# Patient Record
Sex: Female | Born: 1960 | Race: White | Hispanic: No | Marital: Single | Smoking: Never smoker
Health system: Southern US, Community
[De-identification: ages and names within clinical notes are randomized; demographics above are authoritative.]

## PROBLEM LIST (undated history)

## (undated) DIAGNOSIS — Z803 Family history of malignant neoplasm of breast: Secondary | ICD-10-CM

## (undated) DIAGNOSIS — M1990 Unspecified osteoarthritis, unspecified site: Secondary | ICD-10-CM

## (undated) DIAGNOSIS — Z860100 Personal history of colon polyps, unspecified: Secondary | ICD-10-CM

## (undated) DIAGNOSIS — G4730 Sleep apnea, unspecified: Secondary | ICD-10-CM

## (undated) DIAGNOSIS — E119 Type 2 diabetes mellitus without complications: Secondary | ICD-10-CM

## (undated) DIAGNOSIS — K219 Gastro-esophageal reflux disease without esophagitis: Secondary | ICD-10-CM

## (undated) DIAGNOSIS — Z87442 Personal history of urinary calculi: Secondary | ICD-10-CM

## (undated) DIAGNOSIS — T7840XA Allergy, unspecified, initial encounter: Secondary | ICD-10-CM

## (undated) DIAGNOSIS — Z8719 Personal history of other diseases of the digestive system: Secondary | ICD-10-CM

## (undated) HISTORY — DX: Personal history of colon polyps: Z86.010

## (undated) HISTORY — DX: Allergy, unspecified, initial encounter: T78.40XA

## (undated) HISTORY — PX: BREAST SURGERY: SHX581

## (undated) HISTORY — PX: CHOLECYSTECTOMY: SHX55

## (undated) HISTORY — DX: Personal history of urinary calculi: Z87.442

## (undated) HISTORY — DX: Family history of malignant neoplasm of breast: Z80.3

## (undated) HISTORY — DX: Personal history of colon polyps, unspecified: Z86.0100

## (undated) HISTORY — DX: Gastro-esophageal reflux disease without esophagitis: K21.9

## (undated) HISTORY — DX: Unspecified osteoarthritis, unspecified site: M19.90

## (undated) HISTORY — PX: BREAST EXCISIONAL BIOPSY: SUR124

## (undated) HISTORY — PX: BILATERAL CARPAL TUNNEL RELEASE: SHX6508

## (undated) HISTORY — PX: SPINE SURGERY: SHX786

## (undated) HISTORY — DX: Personal history of other diseases of the digestive system: Z87.19

## (undated) HISTORY — DX: Type 2 diabetes mellitus without complications: E11.9

## (undated) HISTORY — PX: OTHER SURGICAL HISTORY: SHX169

---

## 2006-03-26 ENCOUNTER — Ambulatory Visit: Payer: Self-pay | Admitting: Surgery

## 2006-10-12 ENCOUNTER — Ambulatory Visit: Payer: Self-pay | Admitting: Obstetrics and Gynecology

## 2007-04-16 ENCOUNTER — Ambulatory Visit: Payer: Self-pay | Admitting: Obstetrics and Gynecology

## 2007-10-23 ENCOUNTER — Ambulatory Visit: Payer: Self-pay | Admitting: Obstetrics and Gynecology

## 2007-10-30 ENCOUNTER — Emergency Department: Payer: Self-pay | Admitting: Emergency Medicine

## 2008-11-18 ENCOUNTER — Ambulatory Visit: Payer: Self-pay | Admitting: Obstetrics and Gynecology

## 2009-01-28 ENCOUNTER — Encounter: Admission: RE | Admit: 2009-01-28 | Discharge: 2009-01-28 | Payer: Self-pay | Admitting: Sports Medicine

## 2009-02-19 ENCOUNTER — Encounter: Admission: RE | Admit: 2009-02-19 | Discharge: 2009-02-19 | Payer: Self-pay | Admitting: Sports Medicine

## 2009-04-20 ENCOUNTER — Encounter: Admission: RE | Admit: 2009-04-20 | Discharge: 2009-04-20 | Payer: Self-pay | Admitting: Dermatology

## 2009-06-09 ENCOUNTER — Ambulatory Visit (HOSPITAL_COMMUNITY): Admission: RE | Admit: 2009-06-09 | Discharge: 2009-06-10 | Payer: Self-pay | Admitting: Neurological Surgery

## 2009-07-19 ENCOUNTER — Encounter: Admission: RE | Admit: 2009-07-19 | Discharge: 2009-07-19 | Payer: Self-pay | Admitting: Neurological Surgery

## 2009-09-20 ENCOUNTER — Encounter: Admission: RE | Admit: 2009-09-20 | Discharge: 2009-09-20 | Payer: Self-pay | Admitting: Neurological Surgery

## 2009-11-23 ENCOUNTER — Ambulatory Visit: Payer: Self-pay | Admitting: Obstetrics and Gynecology

## 2009-12-07 ENCOUNTER — Encounter: Admission: RE | Admit: 2009-12-07 | Discharge: 2009-12-07 | Payer: Self-pay | Admitting: Neurological Surgery

## 2011-03-27 LAB — BASIC METABOLIC PANEL
BUN: 11 mg/dL (ref 6–23)
CO2: 31 mEq/L (ref 19–32)
Chloride: 101 mEq/L (ref 96–112)
GFR calc Af Amer: >60 mL/min (ref >60)
GFR calc non Af Amer: >60 mL/min (ref >60)
Potassium: 4 mEq/L (ref 3.5–5.1)

## 2011-03-27 LAB — CBC
HCT: 39.6 % (ref 36.0–46.0)
Hemoglobin: 13.7 g/dL (ref 12.0–15.0)
MCHC: 34.5 g/dL (ref 30.0–36.0)
MCV: 91.3 fL (ref 78.0–100.0)
Platelets: 231 10*3/uL (ref 150–400)
RBC: 4.34 MIL/uL (ref 3.87–5.11)
WBC: 8.3 10*3/uL (ref 4.0–10.5)

## 2011-03-27 LAB — DIFFERENTIAL
Eosinophils Relative: 2 % (ref 0–5)
Monocytes Relative: 8 % (ref 3–12)

## 2011-03-27 LAB — PROTIME-INR: INR: 1 (ref 0.00–1.49)

## 2011-05-02 NOTE — Op Note (Signed)
NAMEMELLINA, Brittany Santos NO.:  [id number]   MEDICAL RECORD NO.:  [id number]          PATIENT TYPE:  INP   LOCATION:  3599                         FACILITY:  [facility]   PHYSICIAN:  Tia Alert, MD     DATE OF BIRTH:  Nov 11, 1961   DATE OF PROCEDURE:  06/09/2009  DATE OF DISCHARGE:                               OPERATIVE REPORT   PREOPERATIVE DIAGNOSIS:  Cervical spondylosis of C5-6, C6-7 with neck  and pain.   POSTOPERATIVE DIAGNOSIS:  Cervical spondylosis of C5-6, C6-7 with neck  and pain.   PROCEDURE:  1. Decompressive anterior cervical diskectomy C5-6, C6-7.  2. Anterior cervical arthrodesis C5-6, C6-7 utilizing 6-mm PEEK      interbody cages and packed with local autograft and Actifuse putty.  3. Anterior cervical plating C5 to C7 utilizing a 40-mm Atlantis      Venture plate.   SURGEON:  Tia Alert, MD   ASSISTANT:  Donalee Citrin, MD   ANESTHESIA:  General endotracheal.   COMPLICATIONS:  None apparent.   INDICATION FOR THE PROCEDURE:  Ms. Schmuhl is a 50 year old female who  presented with severe neck pain with bilateral arm.  She had an MRI and  a CT scan, which showed cervical spondylosis at C5-6 and C6-7.  She has  tried medical management for quite some time without significant  relief.  I recommended a two-level anterior cervical diskectomy and  fusion with plating at C5-6 and C6-7.  She understood the risks,  benefits, and expected outcome, and wished to proceed.   DESCRIPTION OF THE PROCEDURE:  The patient was taken to the operating  room.  After induction of adequate general endotracheal anesthesia, she  was placed in supine position on the operating room table.  Her right  anterior cervical region was prepped with DuraPrep and then draped in  usual sterile fashion.  A 5 mL of local anesthesia was injected and a  transverse incision was made to the right of midline and carried down to  the platysma, which was elevated, opened, and then  undermined with  Metzenbaum scissors.  I then dissected a plane medial to the  sternocleidomastoid muscle and internal carotid artery and lateral to  the trachea and esophagus to expose C5-6 and C6-7.  Intraoperative  fluoroscopy confirmed the levels, and I took down the longus colli  muscles and placed the Shadow-Line retractors under these to expose C5-  C6 and C6-C7.  The annulus was incised and the initial diskectomy was  done with pituitary rongeurs and curved curettes.  We then used a high-  speed drill to drill the endplates to prepare for later arthrodesis.  The drill shavings were saved in a mucous trap for later arthrodesis.  I  drilled down to the level of the posterior longitudinal ligament and  posterior osteophytes.  We then brought in the operating microscope.  We  opened the posterior longitudinal ligament with a nerve hook.  Each  level was decompressed in the exact same method.  I opened the posterior  longitudinal ligament with a nerve hook and undercut the upper and lower  vertebral bodies by angling the microscope up and down and angling  Kerrison to right up under this as best I could to circumferentially  decompress the central canal, bilateral foraminotomies were performed,  marching along the superior endplate of each level and identifying the  pedicles and then marching along the nerve roots, and then palpated  circumferentially with a nerve hook to assure adequate decompression.  The dura was full and capacious all the way across.  I then irrigated  with saline solution, dried the surgical bed with Surgifoam, and then  measured the interspace.  I used 6-mm PEEK interbody cages packed with  local autograft, Actifuse  putty and tapped these into position.  I then  used a 40-mm Atlantis Venture plate and placed two 13-mm variable angle  screws in the bodies of C5, C6, and C7, and these locked into the plate  by the locking mechanism within the plate.  I then  irrigated with saline  solution containing bacitracin, dried all bleeding points, with bipolar  cautery.  Once meticulous hemostasis was achieved, I closed the platysma  with 3-0 Vicryl, closed the subcuticular tissue with 3-0 Vicryl, and  closed the skin with Benzoin and Steri-Strips.  The drapes were removed.  A sterile dressing was applied.  The patient was awakened from general  anesthesia and transported to the recovery room in stable condition.  At  the end of procedure, all sponge, needle, and instrument counts were  correct.      Tia Alert, MD  Electronically Signed     DSJ/MEDQ  D:  06/09/2009  T:  06/10/2009  Job:  [id number]

## 2011-08-04 ENCOUNTER — Ambulatory Visit: Payer: Self-pay | Admitting: Gastroenterology

## 2011-08-17 ENCOUNTER — Encounter: Payer: Self-pay | Admitting: Sports Medicine

## 2011-08-19 ENCOUNTER — Encounter: Payer: Self-pay | Admitting: Sports Medicine

## 2011-09-18 ENCOUNTER — Encounter: Payer: Self-pay | Admitting: Sports Medicine

## 2011-10-19 ENCOUNTER — Encounter: Payer: Self-pay | Admitting: Sports Medicine

## 2012-06-26 ENCOUNTER — Ambulatory Visit: Payer: Self-pay | Admitting: Obstetrics and Gynecology

## 2013-11-24 ENCOUNTER — Other Ambulatory Visit: Payer: Self-pay | Admitting: Physical Medicine and Rehabilitation

## 2013-11-24 DIAGNOSIS — M719 Bursopathy, unspecified: Secondary | ICD-10-CM

## 2013-11-26 ENCOUNTER — Ambulatory Visit
Admission: RE | Admit: 2013-11-26 | Discharge: 2013-11-26 | Disposition: A | Discharge status: Home / Self Care | Payer: BC Managed Care – PPO | Source: Ambulatory Visit | Attending: Physical Medicine and Rehabilitation | Admitting: Physical Medicine and Rehabilitation

## 2013-11-26 DIAGNOSIS — M719 Bursopathy, unspecified: Secondary | ICD-10-CM

## 2014-07-23 ENCOUNTER — Ambulatory Visit: Payer: Self-pay | Admitting: Obstetrics and Gynecology

## 2014-07-29 ENCOUNTER — Ambulatory Visit: Payer: Self-pay | Admitting: Obstetrics and Gynecology

## 2014-09-20 LAB — HM PAP SMEAR: HM PAP: NORMAL

## 2014-10-09 LAB — BASIC METABOLIC PANEL
BUN: 11 mg/dL (ref 4–21)
CREATININE: 0.8 mg/dL (ref 0.5–1.1)
POTASSIUM: 4.3 mmol/L (ref 3.4–5.3)
Sodium: 142 mmol/L (ref 137–147)

## 2014-10-09 LAB — CBC AND DIFFERENTIAL
HCT: 41 % (ref 36–46)
Hemoglobin: 13.9 g/dL (ref 12.0–16.0)
NEUTROS ABS: 5 /uL
PLATELETS: 224 10*3/uL (ref 150–399)
WBC: 73 10^3/mL

## 2014-10-09 LAB — HEPATIC FUNCTION PANEL
ALT: 16 U/L (ref 7–35)
AST: 17 U/L (ref 13–35)
Alkaline Phosphatase: 84 U/L (ref 25–125)
BILIRUBIN, TOTAL: 0.6 mg/dL

## 2014-10-09 LAB — TSH: TSH: 1.89 u[IU]/mL (ref 0.41–5.90)

## 2014-10-09 LAB — LIPID PANEL
Cholesterol: 141 mg/dL (ref 0–200)
HDL: 67 mg/dL (ref 35–70)
LDL CALC: 60 mg/dL
Triglycerides: 71 mg/dL (ref 40–160)

## 2014-10-16 LAB — HM MAMMOGRAPHY: HM MAMMO: NORMAL

## 2014-10-23 LAB — BASIC METABOLIC PANEL: Glucose: 162 mg/dL

## 2014-10-23 LAB — HEMOGLOBIN A1C: HEMOGLOBIN A1C: 6.5 % — AB (ref 4.0–6.0)

## 2014-12-21 LAB — HM COLONOSCOPY

## 2015-01-15 ENCOUNTER — Ambulatory Visit: Payer: Self-pay | Admitting: Gastroenterology

## 2015-01-20 ENCOUNTER — Ambulatory Visit (INDEPENDENT_AMBULATORY_CARE_PROVIDER_SITE_OTHER): Payer: BLUE CROSS/BLUE SHIELD | Admitting: Endocrinology

## 2015-01-20 ENCOUNTER — Encounter: Payer: Self-pay | Admitting: Endocrinology

## 2015-01-20 ENCOUNTER — Encounter (INDEPENDENT_AMBULATORY_CARE_PROVIDER_SITE_OTHER): Payer: Self-pay

## 2015-01-20 VITALS — BP 128/88 | HR 81 | Resp 14 | Ht 64.0 in | Wt 242.5 lb

## 2015-01-20 DIAGNOSIS — E119 Type 2 diabetes mellitus without complications: Secondary | ICD-10-CM | POA: Insufficient documentation

## 2015-01-20 DIAGNOSIS — E118 Type 2 diabetes mellitus with unspecified complications: Secondary | ICD-10-CM | POA: Insufficient documentation

## 2015-01-20 LAB — HM DIABETES FOOT EXAM: HM Diabetic Foot Exam: NORMAL

## 2015-01-20 MED ORDER — GLUCOSE BLOOD VI STRP: ORAL_STRIP | Status: DC

## 2015-01-20 NOTE — Patient Instructions (Signed)
Refer to DM education.   Check sugars at various times of the day about 3 times weekly.   Continue current metformin together with diet and weight loss efforts.   Set up with new PCP here Lorane Gell)  Please come back for a follow-up appointment in 3 months

## 2015-01-20 NOTE — Progress Notes (Signed)
Reason for visit-  Brittany Santos is a 54 y.o.-year-old female, referred by her PCP,  Elgie Collard, MD for management of Type 2 diabetes, uncontrolled, without complications- new diagnosis   HPI- Patient has been diagnosed with diabetes in Nov 2015 following screening A1c. She has a prior history of prediabetes. Started on metformin since then.    she has not been on insulin before.  *Gets ESI q3 months , last one Nov 2015  Pt is currently on a regimen of: - Metformin 500 mg po bid>> taking 1000 mg qhs (tolerating well, has on and off diarrhea even prior to start of metformin)    Last hemoglobin A1c was: Lab Results  Component Value Date   HGBA1C 6.5* 10/23/2014     Pt checks her sugars 3 times weekly in the morning . Uses [employer] brand glucometer. By recall/meter download/meter review they are:  PREMEAL Breakfast Lunch Dinner Bedtime Overall  Glucose range: 101-165, mostly around 130      Mean/median:        POST-MEAL PC Breakfast PC Lunch PC Dinner  Glucose range:     Mean/median:       Hypoglycemia-  No lows. Lowest sugar was n/a;   Dietary habits- eats three times daily. Tries to limit carbs, sweetened beverages, sodas, desserts. Eats whole wheat bread, cut out sodas, drinks water 90% time, staying away from desserts Exercise- has exercise equipment at home- uses every other day Weight -  Wt Readings from Last 3 Encounters:  01/20/15 242 lb 8 oz (109.997 kg)    Diabetes Complications-  Nephropathy- No  CKD, last BUN/creatinine- GFR 83 Lab Results  Component Value Date   BUN 11 10/09/2014   CREATININE 0.8 10/09/2014    Retinopathy- No, Last DEE was in n/a Neuropathy- no numbness and tingling in her feet. No known neuropathy.  Associated history - No CAD . No prior stroke. No hypothyroidism. her last TSH was  Lab Results  Component Value Date   TSH 1.89 10/09/2014    Lipids-  her last set of lipids were- Currently on dietary therapy.  Tolerating well.   Lab Results  Component Value Date   CHOL 141 10/09/2014   HDL 67 10/09/2014   LDLCALC 60 10/09/2014   TRIG 71 10/09/2014   Lab Results  Component Value Date   AST 17 10/09/2014   ALT 16 10/09/2014    Blood Pressure/HTN- Patient's blood pressure is well controlled today.  Pt has FH of DM in  none.  I have reviewed the patient's past medical history, family and social history, surgical history, medications and allergies.  Past Medical History  Diagnosis Date  . Diabetes mellitus without complication    Past Surgical History  Procedure Laterality Date  . Breast surgery    . Bilateral carpal tunnel release    . Cholecystectomy    . Cervical neck fusion     Family History  Problem Relation Age of Onset  . Hypertension Brother   . Diabetes Neg Hx    History   Social History  . Marital Status: Single    Spouse Name: N/A    Number of Children: N/A  . Years of Education: N/A   Occupational History  . Not on file.   Social History Main Topics  . Smoking status: Never Smoker   . Smokeless tobacco: Never Used  . Alcohol Use: No  . Drug Use: No  . Sexual Activity: Not on file   Other Topics Concern  .  Not on file   Social History Narrative   No current outpatient prescriptions on file prior to visit.   No current facility-administered medications on file prior to visit.   Allergies  Allergen Reactions  . Vicodin [Hydrocodone-Acetaminophen] Itching and Rash     Review of Systems: [x]  complains of  [  ] denies General:   [  ] Recent weight change [  ] Fatigue  [  ] Loss of appetite Eyes: [  ]  Vision Difficulty [  ]  Eye pain ENT: [  ]  Hearing difficulty [  ]  Difficulty Swallowing CVS: [  ] Chest pain [  ]  Palpitations/Irregular Heart beat [  ]  Shortness of breath lying flat [  ] Swelling of legs Resp: [  ] Frequent Cough [  ] Shortness of Breath  [  ]  Wheezing GI: [ x ] Heartburn  [  ] Nausea or Vomiting  [ x ] Diarrhea [  ]  Constipation  [  ] Abdominal Pain GU: [  ]  Polyuria  [ x ]  nocturia Bones/joints:  [  ]  Muscle aches  [ x ] Joint Pain  [  ] Bone pain Skin/Hair/Nails: [  ]  Rash  [  ] New stretch marks [  ]  Itching [ x ] Hair loss [  ]  Excessive hair growth Reproduction: [  ] Low sexual desire , [  ]  Women: Menstrual cycle problems [  ]  Women: Breast Discharge [  ] Men: Difficulty with erections [  ]  Men: Enlarged Breasts CNS: [  ] Frequent Headaches [  ] Blurry vision [  ] Tremors [  ] Seizures [  ] Loss of consciousness [  ] Localized weakness Endocrine: [  ]  Excess thirst [  ]  Feeling excessively hot [  ]  Feeling excessively cold Heme: [  ]  Easy bruising [  ]  Enlarged glands or lumps in neck Allergy: [  ]  Food allergies [ x ] Environmental allergies  PE: BP 128/88 mmHg  Pulse 81  Resp 14  Ht 5\' 4"  (1.626 m)  Wt 242 lb 8 oz (109.997 kg)  BMI 41.60 kg/m2  SpO2 98% Wt Readings from Last 3 Encounters:  01/20/15 242 lb 8 oz (109.997 kg)   GENERAL: No acute distress, well developed HEENT:  Eye exam shows normal external appearance. Oral exam shows normal mucosa .  NECK:   Neck exam shows no lymphadenopathy. No Carotids bruits. Thyroid is not enlarged and no nodules felt.  no acanthosis nigricans LUNGS:         Chest is symmetrical. Lungs are clear to auscultation.Marland Kitchen   HEART:         Heart sounds:  S1 and S2 are normal. No murmurs or clicks heard. ABDOMEN:  No Distention present. Liver and spleen are not palpable. No other mass or tenderness present.  EXTREMITIES:     There is no edema. 2+ DP pulses  NEUROLOGICAL:     Grossly intact.            Diabetic foot exam done with shoes and socks removed: Normal Monofilament testing bilaterally. No deformities of toes.  Nails  Not dystrophic. Skin normal color. No open wounds. Dry skin.  MUSCULOSKELETAL:       There is no enlargement or gross deformity of the joints.  SKIN:       No rash  ASSESSMENT AND  PLAN: Problem List Items Addressed This  Visit      Endocrine   Type 2 diabetes mellitus - Primary    Discussed pathophysiology of insulin resistance and DM, goal sugars and A1c reviewed. Reviewed long term complications, dietary modifications and exercise, weight loss.   Switch to brand meter and she will let me know if Freestyle is not covered( then will send supplies for Verio One Touch) Check 3 times weekly at various times of the day.  Refer to DM education at [facility].  Discussed options for treatment could include intensive lifestyle modifications with or without metformin.  She wants to continue current metformin as well. Reviewed common side effects and risks.   Foot care, eye exams, hypoglycemia reviewed.  Recent lipids well controlled.  BP at target.   RTC 3 months. Follow up labs then including urine MA.       Relevant Medications   metFORMIN (GLUCOPHAGE) 500 MG tablet   glucose blood (FREESTYLE LITE) test strip   Other Relevant Orders   Amb Referral to Nutrition and Diabetic E         - Return to clinic in 3 mo with sugar log/meter. 45 minutes spent with the patient, >50% time spent on discussion of topics mentioned above.  Set up with Lorane Gell for new PCP Currently, she goes to her GYN for annual exams.    Taylynn Easton [facility] 01/21/2015 9:09 AM

## 2015-01-20 NOTE — Progress Notes (Signed)
Pre visit review using our clinic review tool, if applicable. No additional management support is needed unless otherwise documented below in the visit note. 

## 2015-01-21 ENCOUNTER — Encounter: Payer: Self-pay | Admitting: Endocrinology

## 2015-01-21 NOTE — Assessment & Plan Note (Addendum)
Discussed pathophysiology of insulin resistance and DM, goal sugars and A1c reviewed. Reviewed long term complications, dietary modifications and exercise, weight loss.   Switch to brand meter and she will let me know if Freestyle is not covered( then will send supplies for Verio One Touch) Check 3 times weekly at various times of the day.  Refer to DM education at [facility].  Discussed options for treatment could include intensive lifestyle modifications with or without metformin.  She wants to continue current metformin as well. Reviewed common side effects and risks.   Foot care, eye exams, hypoglycemia reviewed.  Recent lipids well controlled.  BP at target.   RTC 3 months. Follow up labs then including urine MA.

## 2015-03-03 ENCOUNTER — Other Ambulatory Visit: Payer: Self-pay | Admitting: Endocrinology

## 2015-03-03 ENCOUNTER — Other Ambulatory Visit: Payer: Self-pay | Admitting: *Deleted

## 2015-03-03 MED ORDER — METFORMIN HCL 500 MG PO TABS: 1000.0000 mg | ORAL_TABLET | Freq: Every day | ORAL | Status: DC

## 2015-03-18 ENCOUNTER — Encounter: Payer: Self-pay | Admitting: Podiatry

## 2015-03-18 ENCOUNTER — Ambulatory Visit (INDEPENDENT_AMBULATORY_CARE_PROVIDER_SITE_OTHER): Payer: BLUE CROSS/BLUE SHIELD | Admitting: Podiatry

## 2015-03-18 ENCOUNTER — Ambulatory Visit (INDEPENDENT_AMBULATORY_CARE_PROVIDER_SITE_OTHER): Payer: BLUE CROSS/BLUE SHIELD

## 2015-03-18 VITALS — BP 110/69 | HR 81 | Resp 16 | Ht 65.0 in | Wt 235.0 lb

## 2015-03-18 DIAGNOSIS — D239 Other benign neoplasm of skin, unspecified: Secondary | ICD-10-CM

## 2015-03-18 DIAGNOSIS — Q828 Other specified congenital malformations of skin: Secondary | ICD-10-CM

## 2015-03-18 DIAGNOSIS — L600 Ingrowing nail: Secondary | ICD-10-CM

## 2015-03-18 DIAGNOSIS — M79672 Pain in left foot: Secondary | ICD-10-CM

## 2015-03-18 NOTE — Progress Notes (Signed)
   Subjective:    Patient ID: Brittany Santos [id number], female    DOB: Nov 02, 1961, 54 y.o.   MRN:  HPI 54 year old female presents the office today with complaints of a painful callus on the left foot under the little toe. She previously states that she had mild right side in the same area which was trimmed and she did not have any problems afterwards. She denies any redness or drainage on the site. She also states that she has some discomfort along the inside border of the left big toenail the end of the nail. She is concerned and started become ingrown and is mildly painful. Denies any redness or drainage on the nail site. No other complaints at this time. She states that she was recently diagnosed with diabetes. She states that her blood sugars have been  "normal" and her HbA1c was just slightly above normal. Denies any history of injury to the lower extremities.    Review of Systems  HENT: Positive for sinus pressure and sneezing.   Eyes: Positive for redness and itching.  Allergic/Immunologic: Positive for environmental allergies.  All other systems reviewed and are negative.      Objective:   Physical Exam AAO 3, NAD DP/PT pulses palpable, CRT less than 3 seconds Protective sensation intact with Simms Weinstein monofilament, vibratory sensation intact, Achilles tendon reflex intact. Tailors bunion deformities present bilaterally. There is a hyperkeratotic lesion left foot submetatarsal 5 with a small punctate annular lesion centrally. There is tenderness palpation directly overlying this lesion. Upon debridement there is no underlying ulceration, drainage, pinpoint bleeding.  There is mild tenderness on the distal medial aspect left hallux toenail with slight incurvation. There is no tenderness on the proximal medial nail border. There is no other tenderness to other nails. There is no swelling erythema or drainage on the nail sites. There is no areas of pinpoint bony tenderness or pain with  vibratory sensation of bilateral lower extremities. No overlying edema, erythema, increase in warmth bilaterally. No open lesions or other pre-ulcerative lesions identified bilaterally There is slight white discoloration to the toenails. Patient states this area is new and she believes it may be from the nail polish/remover. No pain with calf compression, swelling, warmth, erythema.     Assessment & Plan:  54 year old female with left submetatarsal 5 porokeratosis, tailors bunion, ingrown toenail left hallux -X-rays were obtained and reviewed with the patient -Treatment options were discussed including alternatives, risks, complications. -Left foot hyperkeratotic lesion sharply debrided without complication/bleeding. A pad was applied around the area followed by salinocaine and a bandage. Post procedure care was discussed with the patient. Monitoring signs or symptoms of infection and directed to call the office immediately should any occur or go to the ER. -Left hallux nail sharply debrided without complications/bleeding to patient comfort -Discussed the patient a white spots on the nails could be a result of onychomycosis. Will observe. If there is no improvement will call and start possible treatment.  -Follow-up as needed. In the meantime encouraged to call the office with any questions, concerns, change in symptoms.

## 2015-04-08 ENCOUNTER — Ambulatory Visit
Admit: 2015-04-08 | Disposition: A | Discharge status: Home / Self Care | Payer: Self-pay | Attending: Unknown Physician Specialty | Admitting: Unknown Physician Specialty

## 2015-04-12 LAB — SURGICAL PATHOLOGY

## 2015-04-15 DIAGNOSIS — Z87442 Personal history of urinary calculi: Secondary | ICD-10-CM

## 2015-04-15 HISTORY — DX: Personal history of urinary calculi: Z87.442

## 2015-04-21 ENCOUNTER — Ambulatory Visit (INDEPENDENT_AMBULATORY_CARE_PROVIDER_SITE_OTHER): Payer: BLUE CROSS/BLUE SHIELD | Admitting: Endocrinology

## 2015-04-21 ENCOUNTER — Ambulatory Visit
Admission: RE | Admit: 2015-04-21 | Discharge: 2015-04-21 | Disposition: A | Discharge status: Home / Self Care | Payer: BLUE CROSS/BLUE SHIELD | Source: Ambulatory Visit | Attending: Nurse Practitioner | Admitting: Nurse Practitioner

## 2015-04-21 ENCOUNTER — Ambulatory Visit (INDEPENDENT_AMBULATORY_CARE_PROVIDER_SITE_OTHER): Payer: BLUE CROSS/BLUE SHIELD | Admitting: Nurse Practitioner

## 2015-04-21 ENCOUNTER — Encounter: Payer: Self-pay | Admitting: Endocrinology

## 2015-04-21 ENCOUNTER — Encounter: Payer: Self-pay | Admitting: Nurse Practitioner

## 2015-04-21 VITALS — BP 126/82 | HR 83 | Resp 14 | Ht 64.0 in | Wt 232.8 lb

## 2015-04-21 VITALS — BP 110/72 | HR 81 | Temp 97.6°F | Ht 64.0 in | Wt 232.0 lb

## 2015-04-21 DIAGNOSIS — Z7189 Other specified counseling: Secondary | ICD-10-CM

## 2015-04-21 DIAGNOSIS — Z9109 Other allergy status, other than to drugs and biological substances: Secondary | ICD-10-CM | POA: Insufficient documentation

## 2015-04-21 DIAGNOSIS — Z7689 Persons encountering health services in other specified circumstances: Secondary | ICD-10-CM | POA: Insufficient documentation

## 2015-04-21 DIAGNOSIS — M25511 Pain in right shoulder: Secondary | ICD-10-CM | POA: Diagnosis not present

## 2015-04-21 DIAGNOSIS — E119 Type 2 diabetes mellitus without complications: Secondary | ICD-10-CM | POA: Diagnosis not present

## 2015-04-21 DIAGNOSIS — Z91048 Other nonmedicinal substance allergy status: Secondary | ICD-10-CM

## 2015-04-21 DIAGNOSIS — K219 Gastro-esophageal reflux disease without esophagitis: Secondary | ICD-10-CM | POA: Insufficient documentation

## 2015-04-21 DIAGNOSIS — M1990 Unspecified osteoarthritis, unspecified site: Secondary | ICD-10-CM | POA: Insufficient documentation

## 2015-04-21 LAB — COMPREHENSIVE METABOLIC PANEL
ALK PHOS: 62 U/L (ref 39–117)
ALT: 14 U/L (ref 0–35)
AST: 20 U/L (ref 0–37)
Albumin: 4.1 g/dL (ref 3.5–5.2)
BILIRUBIN TOTAL: 0.6 mg/dL (ref 0.2–1.2)
BUN: 14 mg/dL (ref 6–23)
CO2: 29 meq/L (ref 19–32)
CREATININE: 0.73 mg/dL (ref 0.40–1.20)
Calcium: 9.4 mg/dL (ref 8.4–10.5)
Chloride: 102 mEq/L (ref 96–112)
GFR: 87.92 mL/min (ref >60.00)
Glucose, Bld: 134 mg/dL — ABNORMAL HIGH (ref 70–99)
Potassium: 3.9 mEq/L (ref 3.5–5.1)
SODIUM: 138 meq/L (ref 135–145)
Total Protein: 7.4 g/dL (ref 6.0–8.3)

## 2015-04-21 LAB — LIPID PANEL
CHOLESTEROL: 128 mg/dL (ref 0–200)
HDL: 53 mg/dL (ref >39.00)
LDL Cholesterol: 60 mg/dL (ref 0–99)
NONHDL: 75
TRIGLYCERIDES: 74 mg/dL (ref 0.0–149.0)
Total CHOL/HDL Ratio: 2
VLDL: 14.8 mg/dL (ref 0.0–40.0)

## 2015-04-21 LAB — HEMOGLOBIN A1C: Hgb A1c MFr Bld: 6 % (ref 4.6–6.5)

## 2015-04-21 LAB — MICROALBUMIN / CREATININE URINE RATIO
Creatinine,U: 148 mg/dL
MICROALB/CREAT RATIO: 0.9 mg/g (ref 0.0–30.0)
Microalb, Ur: 1.3 mg/dL (ref 0.0–1.9)

## 2015-04-21 LAB — VITAMIN B12: Vitamin B-12: 256 pg/mL (ref 211–911)

## 2015-04-21 MED ORDER — MELOXICAM 15 MG PO TABS: 15.0000 mg | ORAL_TABLET | Freq: Every day | ORAL | Status: DC

## 2015-04-21 NOTE — Assessment & Plan Note (Signed)
X-ray of shoulder, mobic and ultram for pain. Will follow up 4 weeks.

## 2015-04-21 NOTE — Progress Notes (Signed)
Reason for visit-  Brittany Santos is a 54 y.o.-year-old female,  for management of Type 2 diabetes, uncontrolled, without complications- new diagnosis 2015. Last visit 3 months ago.    HPI- Patient has been diagnosed with diabetes in Nov 2015 following screening A1c. She has a prior history of prediabetes. Started on metformin since then.    she has not been on insulin before.  *Gets ESI q3 months , last one  Feb 2016  Pt is currently on a regimen of: - Metformin 500 mg po bid>> taking 1000 mg qhs (tolerating well, has on and off diarrhea even prior to start of metformin)    Last hemoglobin A1c was: Lab Results  Component Value Date   HGBA1C 6.5* 10/23/2014     Pt checks her sugars 1 times daily at varying times , mostly in the morning . Uses [employer] brand glucometer. By recall they are:  PREMEAL Breakfast Lunch Dinner Bedtime Overall  Glucose range: 130s avg    <150s  Mean/median:        POST-MEAL PC Breakfast PC Lunch PC Dinner  Glucose range:     Mean/median:       Hypoglycemia-  No lows. Lowest sugar was n/a;   Dietary habits- eats three times daily. Tries to limit carbs, sweetened beverages, sodas, desserts. Eats whole wheat bread, cut out sodas, drinks water 90% time, staying away from desserts. Went to [place] [facility]- March 2016- found those helpful Exercise- has exercise equipment at home- uses every other day- now a days- lack of time, working extra Weight -  Wt Readings from Last 3 Encounters:  04/21/15 232 lb 12.8 oz (105.597 kg)  03/18/15 235 lb (106.595 kg)  01/20/15 242 lb 8 oz (109.997 kg)    Diabetes Complications-  Nephropathy- No  CKD, last BUN/creatinine- GFR 83 Lab Results  Component Value Date   BUN 11 10/09/2014   CREATININE 0.8 10/09/2014   No results found for: GFR, MICRALBCREAT   Retinopathy- No, Last DEE was in summer 2015, due for repeat Neuropathy- no numbness and tingling in her feet. No known neuropathy.  Associated  history - No CAD . No prior stroke. No hypothyroidism. her last TSH was  Lab Results  Component Value Date   TSH 1.89 10/09/2014    Lipids-  her last set of lipids were- Currently on dietary therapy. Tolerating well.   Lab Results  Component Value Date   CHOL 141 10/09/2014   HDL 67 10/09/2014   LDLCALC 60 10/09/2014   TRIG 71 10/09/2014   Lab Results  Component Value Date   AST 17 10/09/2014   ALT 16 10/09/2014    Blood Pressure/HTN- Patient's blood pressure is well controlled today.  Pt has FH of DM in  none.  I have reviewed the patient's past medical history, medications and allergies.   Current Outpatient Prescriptions on File Prior to Visit  Medication Sig Dispense Refill  . cetirizine (ZYRTEC) 10 MG tablet Take 10 mg by mouth daily.    Marland Kitchen glucose blood (FREESTYLE LITE) test strip Use as instructed daily 100 each 12  . metFORMIN (GLUCOPHAGE) 500 MG tablet Take 2 tablets (1,000 mg total) by mouth at bedtime. 90 tablet 2  . pantoprazole (PROTONIX) 40 MG tablet Take 40 mg by mouth daily.    . traMADol (ULTRAM) 50 MG tablet Take 1 tablet by mouth as needed.     No current facility-administered medications on file prior to visit.   Allergies  Allergen Reactions  .  Vicodin [Hydrocodone-Acetaminophen] Itching and Rash     Review of Systems- [ x ]  Complains of    [  ]  denies [  ] Recent weight change [  ]  Fatigue [  ] polydipsia [  ] polyuria [  ]  nocturia [  ]  vision difficulty [  ] chest pain [  ] shortness of breath [  ] leg swelling [  ] cough [  ] nausea/vomiting [  ] diarrhea [  ] constipation [  ] abdominal pain [ x ]  tingling/numbness in extremities [ x ]  concern with feet ( wounds/sores)>>calluses on feet- goes to podiatry Having some new onset right shoulder pain, going to address this with [facility].  PE: BP 126/82 mmHg  Pulse 83  Resp 14  Ht 5\' 4"  (1.626 m)  Wt 232 lb 12.8 oz (105.597 kg)  BMI 39.94 kg/m2  SpO2 95% Wt Readings from Last 3  Encounters:  04/21/15 232 lb 12.8 oz (105.597 kg)  03/18/15 235 lb (106.595 kg)  01/20/15 242 lb 8 oz (109.997 kg)   Exam: deferred  ASSESSMENT AND PLAN: Problem List Items Addressed This Visit      Endocrine   Type 2 diabetes mellitus - Primary    Continue current dietary efforts. Try to increase your exercise component.  Continue checking your sugars once daily- every other day at varying times of the day and bring meter to next appointment.  Labs today.  Update A1c and remainder labs.  If A1c has risen up, then will increase metformin.  Asked her to discuss with her PCP about alternative to freq ESI and new onset right shoulder pain Update B12 levels due to symptoms of tingling/Numbness in extremities and because she is on metformin  Recent lipids well controlled.  BP at target.   RTC 4 months.         Relevant Orders   Comprehensive metabolic panel   Hemoglobin A1c   Microalbumin / creatinine urine ratio   Lipid panel   Vitamin B12         - Return to clinic in 4 mo with sugar log/meter.    Brittany Santos [facility] 04/21/2015 8:40 AM

## 2015-04-21 NOTE — Patient Instructions (Signed)
[facility] entrance  Walk in to receptionist and they will tell you where imaging is (open til 7pm?)   Shoulder Pain The shoulder is the joint that connects your arms to your body. The bones that form the shoulder joint include the upper arm bone (humerus), the shoulder blade (scapula), and the collarbone (clavicle). The top of the humerus is shaped like a ball and fits into a rather flat socket on the scapula (glenoid cavity). A combination of muscles and strong, fibrous tissues that connect muscles to bones (tendons) support your shoulder joint and hold the ball in the socket. Small, fluid-filled sacs (bursae) are located in different areas of the joint. They act as cushions between the bones and the overlying soft tissues and help reduce friction between the gliding tendons and the bone as you move your arm. Your shoulder joint allows a wide range of motion in your arm. This range of motion allows you to do things like scratch your back or throw a ball. However, this range of motion also makes your shoulder more prone to pain from overuse and injury. Causes of shoulder pain can originate from both injury and overuse and usually can be grouped in the following four categories:  Redness, swelling, and pain (inflammation) of the tendon (tendinitis) or the bursae (bursitis).  Instability, such as a dislocation of the joint.  Inflammation of the joint (arthritis).  Broken bone (fracture). HOME CARE INSTRUCTIONS   Apply ice to the sore area.  Put ice in a plastic bag.  Place a towel between your skin and the bag.  Leave the ice on for 15-20 minutes, 3-4 times per day for the first 2 days, or as directed by your health care provider.  Stop using cold packs if they do not help with the pain.  If you have a shoulder sling or immobilizer, wear it as long as your caregiver instructs. Only remove it to shower or bathe. Move your arm as little as possible, but keep your hand  moving to prevent swelling.  Squeeze a soft ball or foam pad as much as possible to help prevent swelling.  Only take over-the-counter or prescription medicines for pain, discomfort, or fever as directed by your caregiver. SEEK MEDICAL CARE IF:   Your shoulder pain increases, or new pain develops in your arm, hand, or fingers.  Your hand or fingers become cold and numb.  Your pain is not relieved with medicines. SEEK IMMEDIATE MEDICAL CARE IF:   Your arm, hand, or fingers are numb or tingling.  Your arm, hand, or fingers are significantly swollen or turn white or blue. MAKE SURE YOU:   Understand these instructions.  Will watch your condition.  Will get help right away if you are not doing well or get worse. Document Released: 09/13/2005 Document Revised: 04/20/2014 Document Reviewed: 11/18/2011 [facility] Patient Information 2015 [place], [place]. This information is not intended to replace advice given to you by your health care provider. Make sure you discuss any questions you have with your health care provider.

## 2015-04-21 NOTE — Assessment & Plan Note (Signed)
Discussed acute and chronic issues. Reviewed health maintenance measures, PFSHx, and immunizations.   

## 2015-04-21 NOTE — Assessment & Plan Note (Signed)
Bilateral knee pain, stable, ultram and mobic.

## 2015-04-21 NOTE — Progress Notes (Signed)
Pre visit review using our clinic review tool, if applicable. No additional management support is needed unless otherwise documented below in the visit note. 

## 2015-04-21 NOTE — Progress Notes (Signed)
Subjective:    Patient ID: Brittany Santos, female    DOB: 1961/11/29, 54 y.o.   MRN: [id number]  HPI  Brittany Santos is a 54 yo female with a CC of establishing care and right shoulder pain x 1 week.   Diet- No sodas, wheat bread, decrease sweets, watching carbs/startches, not eat late  Exercise- Being more active  Colonoscopy- 2016  Allergies- zyrtec daily, sudafed prn Arthritis- both knees, right hip (Dr. Ernestina Patches injections)  DM- Dr. Howell Rucks, going well  GERD- controlled with medications  Neck fusion- 5-6 years ago, Dr. Ronnald Ramp [place]   1) Shoulder pain right-  O- 04/15/15 (carries 50-60 lbs daily) L- front anterior shoulder   D- constant C- dull throb radiating to hand, but not involving fingers A- worse with sleeping on right shoulder and with activity  R- Better with rest and when on chest  S- 9/10 worst, 5/10 normally  Treatment to date-   No difference with tramadol    Heat- helpful somewhat   Review of Systems  Constitutional: Negative for fever, chills, diaphoresis and fatigue.  Respiratory: Negative for chest tightness, shortness of breath and wheezing.   Cardiovascular: Negative for chest pain, palpitations and leg swelling.  Endocrine: Negative for polydipsia and polyphagia.  Musculoskeletal: Positive for arthralgias.       Right shoulder pain  Skin: Negative for rash.  Neurological: Negative for dizziness, weakness, numbness and headaches.  Psychiatric/Behavioral: The patient is not nervous/anxious.    Past Medical History  Diagnosis Date  . Diabetes mellitus without complication   . Arthritis   . History of diverticulitis   . Allergy   . GERD (gastroesophageal reflux disease)   . History of colon polyps   . History of kidney stones 04/15/15    History   Social History  . Marital Status: Single    Spouse Name: N/A  . Number of Children: N/A  . Years of Education: N/A   Occupational History  . Not on file.   Social  History Main Topics  . Smoking status: Never Smoker   . Smokeless tobacco: Never Used  . Alcohol Use: No  . Drug Use: No  . Sexual Activity: No   Other Topics Concern  . Not on file   Social History Narrative   Works- [employer]    Mother lives with pt   No children    1 dog lives inside    Right handed    Caffeine- 1 piece chocolate occasionally    Associate degree   Enjoys- gardening and going to concerts     Past Surgical History  Procedure Laterality Date  . Breast surgery    . Bilateral carpal tunnel release    . Cholecystectomy    . Cervical neck fusion      Family History  Problem Relation Age of Onset  . Hypertension Brother   . Diabetes Neg Hx   . Breast cancer Mother   . Cancer Sister 33    Blood cancer (unknown)  . Cancer Maternal Aunt     Lung  . Cancer Maternal Uncle 30    kidney  . Heart attack Paternal Uncle   . Aneurysm Paternal Grandmother     Allergies  Allergen Reactions  . Vicodin [Hydrocodone-Acetaminophen] Itching and Rash    Current Outpatient Prescriptions on File Prior to Visit  Medication Sig Dispense Refill  . cetirizine (ZYRTEC) 10 MG tablet Take 10 mg by mouth daily.    Marland Kitchen  glucose blood (FREESTYLE LITE) test strip Use as instructed daily 100 each 12  . metFORMIN (GLUCOPHAGE) 500 MG tablet Take 2 tablets (1,000 mg total) by mouth at bedtime. 90 tablet 2  . pantoprazole (PROTONIX) 40 MG tablet Take 40 mg by mouth daily.    . traMADol (ULTRAM) 50 MG tablet Take 1 tablet by mouth as needed.     No current facility-administered medications on file prior to visit.      Objective:   Physical Exam  Constitutional: She is oriented to person, place, and time. She appears well-developed and well-nourished. No distress.  BP 110/72 mmHg  Pulse 81  Temp(Src) 97.6 F (36.4 C) (Oral)  Ht 5\' 4"  (1.626 m)  Wt 232 lb (105.235 kg)  BMI 39.80 kg/m2  SpO2 99%   HENT:  Head: Normocephalic and atraumatic.  Right Ear: External ear  normal.  Left Ear: External ear normal.  Eyes: EOM are normal. Pupils are equal, round, and reactive to light. Right eye exhibits no discharge. Left eye exhibits no discharge. No scleral icterus.  Neck: Normal range of motion. Neck supple. No thyromegaly present.  Cardiovascular: Normal rate, regular rhythm and normal heart sounds.   Pulmonary/Chest: Effort normal and breath sounds normal. No respiratory distress. She has no wheezes. She has no rales. She exhibits no tenderness.  Musculoskeletal: She exhibits tenderness. She exhibits no edema.  Pt has very tender anterior shoulder at bicep tendon attachment point, unable to perform ROM due to pain and unable to keep up against resistance without severe pain.   Lymphadenopathy:    She has no cervical adenopathy.  Neurological: She is alert and oriented to person, place, and time. No cranial nerve deficit. She exhibits normal muscle tone. Coordination normal.  Skin: Skin is warm and dry. No rash noted. She is not diaphoretic.  Psychiatric: She has a normal mood and affect. Her behavior is normal. Judgment and thought content normal.       Assessment & Plan:

## 2015-04-21 NOTE — Assessment & Plan Note (Signed)
Continue current dietary efforts. Try to increase your exercise component.  Continue checking your sugars once daily- every other day at varying times of the day and bring meter to next appointment.  Labs today.  Update A1c and remainder labs.  If A1c has risen up, then will increase metformin.  Asked her to discuss with her PCP about alternative to freq ESI and new onset right shoulder pain Update B12 levels due to symptoms of tingling/Numbness in extremities and because she is on metformin  Recent lipids well controlled.  BP at target.   RTC 4 months.

## 2015-04-21 NOTE — Assessment & Plan Note (Signed)
Stable. Zyrtec daily and sudafed as needed

## 2015-04-21 NOTE — Assessment & Plan Note (Signed)
Sees Dr. Howell Rucks- see note from today's visit. Stable currently

## 2015-04-21 NOTE — Patient Instructions (Addendum)
Continue checking sugars and try to exercise daily. Labs today. Continue current metformin Please come back for a follow-up appointment in 4 months

## 2015-04-21 NOTE — Assessment & Plan Note (Signed)
Stable on Protonix. 

## 2015-04-29 ENCOUNTER — Other Ambulatory Visit: Payer: Self-pay | Admitting: Nurse Practitioner

## 2015-04-29 ENCOUNTER — Telehealth: Payer: Self-pay | Admitting: Nurse Practitioner

## 2015-04-29 DIAGNOSIS — M25511 Pain in right shoulder: Secondary | ICD-10-CM

## 2015-04-29 NOTE — Telephone Encounter (Signed)
Placed order for Right shoulder MRI. Placed for [facility]. Please inform pt we will contact her in the next few weeks to schedule. In the mean time I can schedule a follow up visit with her to explore other pain management options if she would like.

## 2015-04-29 NOTE — Telephone Encounter (Signed)
Please advise 

## 2015-04-29 NOTE — Telephone Encounter (Signed)
The patient called and stated that someone would call her on this pass Monday to follow up on her condition . She informed me that she was given anti inflammatory medication to for her shoulder and it is not working . She also stated that if the medication did not work that she will need to have an MRI done because the xray did not show anything. She would like to got to [facility] on [address] to have the MRI done.

## 2015-04-30 NOTE — Telephone Encounter (Signed)
Spoke with patient and she verbalized understanding.  Pt says she is doing okay on Tramadol and watching the way she moves the arm.  She does not want to schedule a visit to discuss other pain management options. Pt will wait to make an appointment after MRI is resulted.

## 2015-05-09 ENCOUNTER — Ambulatory Visit
Admission: RE | Admit: 2015-05-09 | Discharge: 2015-05-09 | Disposition: A | Discharge status: Home / Self Care | Payer: BLUE CROSS/BLUE SHIELD | Source: Ambulatory Visit | Attending: Nurse Practitioner | Admitting: Nurse Practitioner

## 2015-05-09 DIAGNOSIS — M25511 Pain in right shoulder: Secondary | ICD-10-CM

## 2015-05-10 ENCOUNTER — Other Ambulatory Visit: Payer: Self-pay | Admitting: Nurse Practitioner

## 2015-05-10 DIAGNOSIS — M7581 Other shoulder lesions, right shoulder: Principal | ICD-10-CM

## 2015-05-10 DIAGNOSIS — M778 Other enthesopathies, not elsewhere classified: Secondary | ICD-10-CM

## 2015-05-25 ENCOUNTER — Ambulatory Visit: Payer: BLUE CROSS/BLUE SHIELD | Admitting: Nurse Practitioner

## 2015-07-01 ENCOUNTER — Other Ambulatory Visit: Payer: Self-pay | Admitting: Obstetrics and Gynecology

## 2015-07-01 DIAGNOSIS — Z1231 Encounter for screening mammogram for malignant neoplasm of breast: Secondary | ICD-10-CM

## 2015-07-12 ENCOUNTER — Other Ambulatory Visit: Payer: Self-pay

## 2015-07-13 ENCOUNTER — Other Ambulatory Visit: Payer: Self-pay

## 2015-07-16 ENCOUNTER — Other Ambulatory Visit: Payer: Self-pay | Admitting: *Deleted

## 2015-07-16 MED ORDER — METFORMIN HCL 500 MG PO TABS: 1000.0000 mg | ORAL_TABLET | Freq: Every day | ORAL | Status: DC

## 2015-07-16 MED ORDER — METFORMIN HCL 1000 MG PO TABS: 1000.0000 mg | ORAL_TABLET | Freq: Every day | ORAL | Status: DC

## 2015-07-16 NOTE — Telephone Encounter (Signed)
Rx refill sent to [facility] for Metformin 1,000mg  Q[facility]. Left message on patients voicemail

## 2015-08-12 ENCOUNTER — Ambulatory Visit: Payer: BLUE CROSS/BLUE SHIELD | Admitting: Nurse Practitioner

## 2015-08-12 ENCOUNTER — Ambulatory Visit (INDEPENDENT_AMBULATORY_CARE_PROVIDER_SITE_OTHER): Payer: BLUE CROSS/BLUE SHIELD | Admitting: Nurse Practitioner

## 2015-08-12 ENCOUNTER — Encounter (INDEPENDENT_AMBULATORY_CARE_PROVIDER_SITE_OTHER): Payer: Self-pay

## 2015-08-12 VITALS — BP 132/80 | HR 79 | Resp 16 | Ht 64.0 in | Wt 228.6 lb

## 2015-08-12 DIAGNOSIS — L659 Nonscarring hair loss, unspecified: Secondary | ICD-10-CM | POA: Diagnosis not present

## 2015-08-12 DIAGNOSIS — E119 Type 2 diabetes mellitus without complications: Secondary | ICD-10-CM

## 2015-08-12 DIAGNOSIS — M25511 Pain in right shoulder: Secondary | ICD-10-CM | POA: Diagnosis not present

## 2015-08-12 LAB — BASIC METABOLIC PANEL
BUN: 14 mg/dL (ref 6–23)
CO2: 29 meq/L (ref 19–32)
CREATININE: 0.68 mg/dL (ref 0.40–1.20)
Calcium: 9.4 mg/dL (ref 8.4–10.5)
Chloride: 104 mEq/L (ref 96–112)
GFR: 95.32 mL/min (ref >60.00)
Glucose, Bld: 127 mg/dL — ABNORMAL HIGH (ref 70–99)
Potassium: 4.4 mEq/L (ref 3.5–5.1)
Sodium: 141 mEq/L (ref 135–145)

## 2015-08-12 LAB — HEMOGLOBIN A1C: Hgb A1c MFr Bld: 5.8 % (ref 4.6–6.5)

## 2015-08-12 LAB — T4, FREE: FREE T4: 1.08 ng/dL (ref 0.60–1.60)

## 2015-08-12 LAB — TSH: TSH: 2.05 u[IU]/mL (ref 0.35–4.50)

## 2015-08-12 NOTE — Patient Instructions (Signed)
Please visit the lab before leaving today.   Things to work on: Stress relief Diet and exercise (keep up the good work!)  Follow up in 3 months with me.

## 2015-08-12 NOTE — Progress Notes (Signed)
Patient ID: Brittany Santos [id number], female    DOB: 24-May-1961  Age: 54 y.o. MRN: [id number]  CC: Follow-up   HPI Brittany Santos [id number] presents for follow up of diabetes.   1) Diabetes- Sugars Ranging: 113- 158   Metformin- no changes by Dr. 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 Pt reports no complaints today.   History Brittany Santos has a past medical history of Diabetes mellitus without complication; Arthritis; History of diverticulitis; Allergy; GERD (gastroesophageal reflux disease); History of colon polyps; and History of kidney stones (04/15/15).   She has past surgical history that includes Breast surgery; Bilateral carpal tunnel release; Cholecystectomy; and cervical neck fusion.   Her family history includes Aneurysm in her paternal grandmother; Breast cancer in her mother; Cancer in her maternal aunt; Cancer (age of onset: 89) in her sister; Cancer (age of onset: 10) in her maternal uncle; Heart attack in her paternal uncle; Hypertension in her brother. There is no history of Diabetes.She reports that she has never smoked. She has never used smokeless tobacco. She reports that she does not drink alcohol or use illicit drugs.  Outpatient Prescriptions Prior to Visit  Medication Sig Dispense Refill  . cetirizine (ZYRTEC) 10 MG tablet Take 10 mg by mouth daily.    Marland Kitchen glucose blood (FREESTYLE LITE) test strip Use as instructed daily 100 each 12  . metFORMIN (GLUCOPHAGE) 500 MG tablet Take 2 tablets (1,000 mg total) by mouth at bedtime. 180 tablet 1  . pantoprazole (PROTONIX) 40 MG tablet Take 40 mg by mouth daily.    . traMADol (ULTRAM) 50 MG tablet Take 1 tablet by  mouth as needed.    . meloxicam (MOBIC) 15 MG tablet Take 1 tablet (15 mg total) by mouth daily. (Patient not taking: Reported on 08/12/2015) 30 tablet 0   No facility-administered medications prior to visit.    ROS Review of Systems  Constitutional: Negative for fever, chills, diaphoresis and fatigue.  Eyes: Negative for visual disturbance.  Respiratory: Negative for chest tightness, shortness of breath and wheezing.   Cardiovascular: Negative for chest pain, palpitations and leg swelling.  Gastrointestinal: Negative for nausea, vomiting and diarrhea.  Endocrine: Negative for cold intolerance, heat intolerance, polydipsia, polyphagia and polyuria.       Hair thinning  Musculoskeletal: Negative for arthralgias.  Skin: Negative for rash.  Neurological: Negative for dizziness, numbness and headaches.    Objective:  BP 132/80 mmHg  Pulse 79  Resp 16  Ht 5\' 4"  (1.626 m)  Wt 228 lb 9.6 oz (103.692 kg)  BMI 39.22 kg/m2  SpO2 96%  Physical Exam  Constitutional: She is oriented to person, place, and time. She appears well-developed and well-nourished. No distress.  HENT:  Head: Normocephalic and atraumatic.  Right Ear: External ear normal.  Left Ear: External ear normal.  Cardiovascular: Normal rate, regular rhythm and normal heart sounds.  Exam reveals no gallop and no friction rub.   No murmur heard. Pulmonary/Chest: Effort normal and breath sounds normal. No respiratory distress. She has no wheezes. She has no rales. She exhibits no tenderness.  Neurological: She is alert and oriented to  person, place, and time. No cranial nerve deficit. She exhibits normal muscle tone. Coordination normal.  Skin: Skin is warm and dry. No rash noted. She is not diaphoretic.  Psychiatric: She has a normal mood and affect. Her behavior is normal. Judgment and thought content normal.   Assessment & Plan:   Brittany Santos was seen today for follow-up.  Diagnoses and all orders for this visit:  Thinning  hair -     TSH -     T4, free  Type 2 diabetes mellitus without complication -     Hemoglobin A1c -     Basic Metabolic Panel (BMET)   I am having Brittany Santos maintain her pantoprazole, traMADol, cetirizine, glucose blood, meloxicam, metFORMIN, and progesterone.  Meds ordered this encounter  Medications  . progesterone (PROMETRIUM) 100 MG capsule    Sig: Take 100 mg by mouth daily.     Follow-up: No Follow-up on file.

## 2015-08-12 NOTE — Progress Notes (Signed)
Pre visit review using our clinic review tool, if applicable. No additional management support is needed unless otherwise documented below in the visit note. 

## 2015-08-20 LAB — HM DIABETES EYE EXAM

## 2015-08-24 ENCOUNTER — Encounter: Payer: Self-pay | Admitting: Nurse Practitioner

## 2015-08-24 NOTE — Assessment & Plan Note (Signed)
Patient reports overall thinning of hair. Uncontrolled. No alopecia on exam today. Will check thyroid levels.

## 2015-08-24 NOTE — Assessment & Plan Note (Signed)
Controlled. Patient reports fasting sugar ranging from 113-158. We'll recheck A1c today as well as a BMET. Patient has eye exam scheduled. Patient declines all immunizations today. Patient reports she is continuing to work on her exercise she has a very active job she reports. Patient is down 4 pounds from previous visit in May. Follow-up in 3 months

## 2015-08-24 NOTE — Assessment & Plan Note (Signed)
Patient reports she had injections in the orthopedic office. She has no complaints today.

## 2015-08-26 ENCOUNTER — Ambulatory Visit: Payer: BLUE CROSS/BLUE SHIELD | Admitting: Endocrinology

## 2015-09-29 ENCOUNTER — Other Ambulatory Visit: Payer: Self-pay | Admitting: *Deleted

## 2015-09-29 MED ORDER — GLUCOSE BLOOD VI STRP
ORAL_STRIP | Status: DC
Start: 2015-09-29 — End: 2020-01-09

## 2015-09-29 MED ORDER — ONETOUCH DELICA LANCING DEV MISC: Status: DC

## 2015-09-29 MED ORDER — ONETOUCH DELICA LANCETS 33G MISC: Status: DC

## 2015-10-04 ENCOUNTER — Other Ambulatory Visit: Payer: Self-pay

## 2015-10-21 ENCOUNTER — Emergency Department
Admission: EM | Admit: 2015-10-21 | Discharge: 2015-10-21 | Disposition: A | Discharge status: Home / Self Care | Payer: BLUE CROSS/BLUE SHIELD | Attending: Emergency Medicine | Admitting: Emergency Medicine

## 2015-10-21 ENCOUNTER — Emergency Department: Payer: BLUE CROSS/BLUE SHIELD

## 2015-10-21 ENCOUNTER — Encounter: Payer: Self-pay | Admitting: Emergency Medicine

## 2015-10-21 DIAGNOSIS — M25512 Pain in left shoulder: Secondary | ICD-10-CM | POA: Insufficient documentation

## 2015-10-21 DIAGNOSIS — Z79890 Hormone replacement therapy (postmenopausal): Secondary | ICD-10-CM | POA: Insufficient documentation

## 2015-10-21 DIAGNOSIS — E119 Type 2 diabetes mellitus without complications: Secondary | ICD-10-CM | POA: Insufficient documentation

## 2015-10-21 DIAGNOSIS — Z7984 Long term (current) use of oral hypoglycemic drugs: Secondary | ICD-10-CM | POA: Diagnosis not present

## 2015-10-21 DIAGNOSIS — R079 Chest pain, unspecified: Secondary | ICD-10-CM

## 2015-10-21 DIAGNOSIS — Z79899 Other long term (current) drug therapy: Secondary | ICD-10-CM | POA: Diagnosis not present

## 2015-10-21 DIAGNOSIS — M7918 Myalgia, other site: Secondary | ICD-10-CM

## 2015-10-21 LAB — CBC
HEMATOCRIT: 41.3 % (ref 35.0–47.0)
Hemoglobin: 13.6 g/dL (ref 12.0–16.0)
MCH: 28.9 pg (ref 26.0–34.0)
MCHC: 32.9 g/dL (ref 32.0–36.0)
MCV: 88 fL (ref 80.0–100.0)
Platelets: 200 10*3/uL (ref 150–440)
RBC: 4.69 MIL/uL (ref 3.80–5.20)
RDW: 14.8 % — ABNORMAL HIGH (ref 11.5–14.5)
WBC: 10.1 10*3/uL (ref 3.6–11.0)

## 2015-10-21 LAB — BASIC METABOLIC PANEL
Anion gap: 6 (ref 5–15)
BUN: 18 mg/dL (ref 6–20)
CALCIUM: 9.2 mg/dL (ref 8.9–10.3)
CO2: 30 mmol/L (ref 22–32)
CREATININE: 0.73 mg/dL (ref 0.44–1.00)
Chloride: 103 mmol/L (ref 101–111)
GFR calc Af Amer: >60 mL/min (ref >60)
GLUCOSE: 161 mg/dL — AB (ref 65–99)
Potassium: 3.6 mmol/L (ref 3.5–5.1)
Sodium: 139 mmol/L (ref 135–145)

## 2015-10-21 LAB — TROPONIN I

## 2015-10-21 MED ORDER — CYCLOBENZAPRINE HCL 10 MG PO TABS: 10.0000 mg | ORAL_TABLET | Freq: Three times a day (TID) | ORAL | Status: DC | PRN

## 2015-10-21 NOTE — Discharge Instructions (Signed)
Chest Wall Pain Chest wall pain is pain in or around the bones and muscles of your chest. Sometimes, an injury causes this pain. Sometimes, the cause may not be known. This pain may take several weeks or longer to get better. HOME CARE INSTRUCTIONS  Pay attention to any changes in your symptoms. Take these actions to help with your pain:   Rest as told by your health care provider.   Avoid activities that cause pain. These include any activities that use your chest muscles or your abdominal and side muscles to lift heavy items.   If directed, apply ice to the painful area:  Put ice in a plastic bag.  Place a towel between your skin and the bag.  Leave the ice on for 20 minutes, 2-3 times per day.  Take over-the-counter and prescription medicines only as told by your health care provider.  Do not use tobacco products, including cigarettes, chewing tobacco, and e-cigarettes. If you need help quitting, ask your health care provider.  Keep all follow-up visits as told by your health care provider. This is important. SEEK MEDICAL CARE IF:  You have a fever.  Your chest pain becomes worse.  You have new symptoms. SEEK IMMEDIATE MEDICAL CARE IF:  You have nausea or vomiting.  You feel sweaty or light-headed.  You have a cough with phlegm (sputum) or you cough up blood.  You develop shortness of breath.   This information is not intended to replace advice given to you by your health care provider. Make sure you discuss any questions you have with your health care provider.   Document Released: 12/04/2005 Document Revised: 08/25/2015 Document Reviewed: 03/01/2015 Elsevier Interactive Patient Education 2016 [employer]  Musculoskeletal Pain Musculoskeletal pain is muscle and boney aches and pains. These pains can occur in any part of the body. Your caregiver may treat you without knowing the cause of the pain. They may treat you if blood or urine tests, X-rays, and other  tests were normal.  CAUSES There is often not a definite cause or reason for these pains. These pains may be caused by a type of germ (virus). The discomfort may also come from overuse. Overuse includes working out too hard when your body is not fit. Boney aches also come from weather changes. Bone is sensitive to atmospheric pressure changes. HOME CARE INSTRUCTIONS   Ask when your test results will be ready. Make sure you get your test results.  Only take over-the-counter or prescription medicines for pain, discomfort, or fever as directed by your caregiver. If you were given medications for your condition, do not drive, operate machinery or power tools, or sign legal documents for 24 hours. Do not drink alcohol. Do not take sleeping pills or other medications that may interfere with treatment.  Continue all activities unless the activities cause more pain. When the pain lessens, slowly resume normal activities. Gradually increase the intensity and duration of the activities or exercise.  During periods of severe pain, bed rest may be helpful. Lay or sit in any position that is comfortable.  Putting ice on the injured area.  Put ice in a bag.  Place a towel between your skin and the bag.  Leave the ice on for 15 to 20 minutes, 3 to 4 times a day.  Follow up with your caregiver for continued problems and no reason can be found for the pain. If the pain becomes worse or does not go away, it may be necessary to repeat  tests or do additional testing. Your caregiver may need to look further for a possible cause. SEEK IMMEDIATE MEDICAL CARE IF:  You have pain that is getting worse and is not relieved by medications.  You develop chest pain that is associated with shortness or breath, sweating, feeling sick to your stomach (nauseous), or throw up (vomit).  Your pain becomes localized to the abdomen.  You develop any new symptoms that seem different or that concern you. MAKE SURE YOU:    Understand these instructions.  Will watch your condition.  Will get help right away if you are not doing well or get worse.   This information is not intended to replace advice given to you by your health care provider. Make sure you discuss any questions you have with your health care provider.   Document Released: 12/04/2005 Document Revised: 02/26/2012 Document Reviewed: 08/08/2013 Elsevier Interactive Patient Education [employer].

## 2015-10-21 NOTE — ED Notes (Signed)
C/o left sided chest pain.  Pain radiates to left shoulder, down left arm, up left neck.  Pain is worse with movement.  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  States pain worse when carrying bag or moving left arm.  No SOB/ DOE.  Skin warm and dry.

## 2015-10-21 NOTE — ED Provider Notes (Signed)
[facility] Emergency Department Provider Note  Time seen: 6:38 PM  I have reviewed the triage vital signs and the nursing notes.   HISTORY  Chief Complaint Chest Pain    HPI Brittany Santos [id number] is a 54 y.o. female with a past medical history of diabetes, arthritis, gastric reflux who presents the emergency department left shoulder/left chest pain. According to the patient for the past 2 days she has had pain in her left chest and left shoulder, worse with any movement of the left arm. States she has to lift heavy objects at work, and this is happened before where she is only muscle. Denies any trouble breathing, shortness of breath, diaphoresis, nausea. Denies any pleuritic nature of the pain. Describes the pain is mild currently, moderate when she moves her left arm, especially if she has to lift with her left arm.     Past Medical History  Diagnosis Date  . Diabetes mellitus without complication ([place])   . Arthritis   . History of diverticulitis   . Allergy   . GERD (gastroesophageal reflux disease)   . History of colon polyps   . History of kidney stones 04/15/15    Patient Active Problem List   Diagnosis Date Noted  . Thinning hair 08/24/2015  . Encounter to establish care 04/21/2015  . Environmental allergies 04/21/2015  . Arthritis 04/21/2015  . GERD (gastroesophageal reflux disease) 04/21/2015  . Right shoulder pain 04/21/2015  . Back pain 01/21/2015  . Type 2 diabetes mellitus ([place]) 01/20/2015    Past Surgical History  Procedure Laterality Date  . Breast surgery    . Bilateral carpal tunnel release    . Cholecystectomy    . Cervical neck fusion      Current Outpatient Rx  Name  Route  Sig  Dispense  Refill  . cetirizine (ZYRTEC) 10 MG tablet   Oral   Take 10 mg by mouth daily.         Marland Kitchen glucose blood test strip      Use as instructed Dx: E11.9   100 each   [place]   . Lancet Devices (ONE TOUCH DELICA  LANCING DEV) MISC      Use as directed Dx: E11.9   1 each   0   . meloxicam (MOBIC) 15 MG tablet   Oral   Take 1 tablet (15 mg total) by mouth daily. Patient not taking: Reported on 08/12/2015   30 tablet   0   . metFORMIN (GLUCOPHAGE) 500 MG tablet   Oral   Take 2 tablets (1,000 mg total) by mouth at bedtime.   180 tablet   1   . ONETOUCH DELICA LANCETS 95A MISC      Use as directed Dx: E11.9   100 each   11   . pantoprazole (PROTONIX) 40 MG tablet   Oral   Take 40 mg by mouth daily.         . progesterone (PROMETRIUM) 100 MG capsule   Oral   Take 100 mg by mouth daily.         . traMADol (ULTRAM) 50 MG tablet   Oral   Take 1 tablet by mouth as needed.           Allergies Vicodin  Family History  Problem Relation Age of Onset  . Hypertension Brother   . Diabetes Neg Hx   . Breast cancer Mother   . Cancer Sister  20    Blood cancer (unknown)  . Cancer Maternal Aunt     Lung  . Cancer Maternal Uncle 30    kidney  . Heart attack Paternal Uncle   . Aneurysm Paternal Grandmother     Social History Social History  Substance Use Topics  . Smoking status: Never Smoker   . Smokeless tobacco: Never Used  . Alcohol Use: No    Review of Systems Constitutional: Negative for fever. Cardiovascular: Left chest/shoulder pain. Respiratory: Negative for shortness of breath. Gastrointestinal: Negative for abdominal pain, vomiting and diarrhea. Musculoskeletal: Left shoulder pain. Neurological: Negative for headaches, focal weakness or numbness 10-point ROS otherwise negative.  ____________________________________________   PHYSICAL EXAM:  VITAL SIGNS: ED Triage Vitals  Enc Vitals Group     BP 10/21/15 1724 146/80 mmHg     Pulse Rate 10/21/15 1724 83     Resp 10/21/15 1724 16     Temp 10/21/15 1724 97.5 F (36.4 C)     Temp Source 10/21/15 1724 Oral     SpO2 10/21/15 1724 100 %     Weight 10/21/15 1724 225 lb (102.059 kg)     Height 10/21/15  1724 5\' 5"  (1.651 m)     Head Cir --      Peak Flow --      Pain Score 10/21/15 1726 5     Pain Loc --      Pain Edu? --      Excl. in [place]? --     Constitutional: Alert and oriented. Well appearing and in no distress. Eyes: Normal exam ENT   Head: Normocephalic and atraumatic.   Mouth/Throat: Mucous membranes are moist. Cardiovascular: Normal rate, regular rhythm. No murmur Respiratory: Normal respiratory effort without tachypnea nor retractions. Breath sounds are clear and equal bilaterally. No wheezes/rales/rhonchi. Moderate tenderness of the left shoulder, and left trapezius. Gastrointestinal: Soft and nontender. No distention.   Musculoskeletal: Nontender with normal range of motion in all extremities. No lower extremity tenderness or edema. Left shoulder/left trapezius tenderness palpation, moderate. Neurologic:  Normal speech and language. No gross focal neurologic deficits  Skin:  Skin is warm, dry and intact.  Psychiatric: Mood and affect are normal. Speech and behavior are normal.  ____________________________________________    EKG  EKG reviewed and interpreted by myself shows normal sinus rhythm at 75 bpm, narrow QRS, left axis deviation, nonspecific ST changes present. No ST elevations noted.  ____________________________________________    RADIOLOGY  Chest x-ray shows no acute abnormalities.  ____________________________________________    INITIAL IMPRESSION / ASSESSMENT AND PLAN / ED COURSE  Pertinent labs & imaging results that were available during my care of the patient were reviewed by me and considered in my medical decision making (see chart for details).  Patient with an exam and story most suggestive of musculoskeletal pain. Patient's labs including troponin is negative, EKG appears well, no acute changes in her chest x-ray. Very reproducible pain on exam, much worse with shoulder palpation or movement. Pain is in the left neck radiating down  the left trapezius to left shoulder, worse with arm movement.   We will discharge the patient home with Flexeril, patient has Ultram at home. ____________________________________________   FINAL CLINICAL IMPRESSION(S) / ED DIAGNOSES  Chest pain [place] skeletal pain   Harvest Dark, MD 10/21/15 [phone]

## 2015-10-21 NOTE — ED Notes (Signed)
Patient verbalized that she feels like she pulled a muscle because the pain starts in anterior chest and radiates up left neck.  Patient stated that it feels tight and spasms when she moves arm or neck.  Patient denies any trauma to self, but reports having to carry a 12lb computer for work.

## 2015-11-17 ENCOUNTER — Ambulatory Visit: Payer: BLUE CROSS/BLUE SHIELD | Admitting: Nurse Practitioner

## 2015-12-07 ENCOUNTER — Ambulatory Visit: Payer: BLUE CROSS/BLUE SHIELD | Admitting: Sports Medicine

## 2015-12-19 HISTORY — PX: COLONOSCOPY: SHX174

## 2015-12-21 ENCOUNTER — Ambulatory Visit (INDEPENDENT_AMBULATORY_CARE_PROVIDER_SITE_OTHER): Payer: BLUE CROSS/BLUE SHIELD | Admitting: Nurse Practitioner

## 2015-12-21 ENCOUNTER — Encounter: Payer: Self-pay | Admitting: Nurse Practitioner

## 2015-12-21 VITALS — BP 132/86 | HR 84 | Resp 16 | Ht 65.0 in | Wt 230.0 lb

## 2015-12-21 DIAGNOSIS — K219 Gastro-esophageal reflux disease without esophagitis: Secondary | ICD-10-CM

## 2015-12-21 DIAGNOSIS — E119 Type 2 diabetes mellitus without complications: Secondary | ICD-10-CM

## 2015-12-21 DIAGNOSIS — L659 Nonscarring hair loss, unspecified: Secondary | ICD-10-CM

## 2015-12-21 DIAGNOSIS — K589 Irritable bowel syndrome without diarrhea: Secondary | ICD-10-CM | POA: Diagnosis not present

## 2015-12-21 LAB — LIPID PANEL
CHOL/HDL RATIO: 3
CHOLESTEROL: 143 mg/dL (ref 0–200)
HDL: 52.8 mg/dL (ref >39.00)
LDL CALC: 75 mg/dL (ref 0–99)
NonHDL: 90.4
Triglycerides: 76 mg/dL (ref 0.0–149.0)
VLDL: 15.2 mg/dL (ref 0.0–40.0)

## 2015-12-21 LAB — COMPREHENSIVE METABOLIC PANEL
ALBUMIN: 4 g/dL (ref 3.5–5.2)
ALT: 14 U/L (ref 0–35)
AST: 16 U/L (ref 0–37)
Alkaline Phosphatase: 64 U/L (ref 39–117)
BILIRUBIN TOTAL: 0.6 mg/dL (ref 0.2–1.2)
BUN: 16 mg/dL (ref 6–23)
CHLORIDE: 101 meq/L (ref 96–112)
CO2: 32 mEq/L (ref 19–32)
CREATININE: 0.68 mg/dL (ref 0.40–1.20)
Calcium: 9.6 mg/dL (ref 8.4–10.5)
GFR: 95.19 mL/min (ref >60.00)
Glucose, Bld: 148 mg/dL — ABNORMAL HIGH (ref 70–99)
Potassium: 3.8 mEq/L (ref 3.5–5.1)
SODIUM: 138 meq/L (ref 135–145)
Total Protein: 7 g/dL (ref 6.0–8.3)

## 2015-12-21 LAB — HEMOGLOBIN A1C: Hgb A1c MFr Bld: 5.9 % (ref 4.6–6.5)

## 2015-12-21 MED ORDER — TETANUS-DIPHTH-ACELL PERTUSSIS 5-2.5-18.5 LF-MCG/0.5 IM SUSP
0.5000 mL | Freq: Once | INTRAMUSCULAR | Status: AC
  Administered 2015-12-21: 0.5 mL via INTRAMUSCULAR

## 2015-12-21 NOTE — Progress Notes (Signed)
Patient ID: Brittany Santos [id number], female    DOB: 1961-04-28  Age: 55 y.o. MRN: [id number]  CC: Follow-up   HPI Brittany Santos [id number] presents for follow up of diabetes.   1) Last A1c- 5.8 in August  Blood sugars- 125 this morning, 85-231 over the past month (231 was abnormal for her)  Exercise- Nothing has changed Diet- nothing has changed  Medications- added biotin, stopped Mobic, still taking Metformin 2 tablets at bedtime.  Refills- None today  Foot exam- done by podiatry last week  2) Taking Biotin and has noticed improvement in hair quality and quantity. She reports increased stress at home due to mother being ill with Pneumonia.   History Brittany Santos has a past medical history of Diabetes mellitus without complication ([place]); Arthritis; History of diverticulitis; Allergy; GERD (gastroesophageal reflux disease); History of colon polyps; and History of kidney stones (04/15/15).   She has past surgical history that includes Breast surgery; Bilateral carpal tunnel release; Cholecystectomy; and cervical neck fusion.   Her family history includes Aneurysm in her paternal grandmother; Breast cancer in her mother; Cancer in her maternal aunt; Cancer (age of onset: 86) in her sister; Cancer (age of onset: 51) in her maternal uncle; Heart attack in her paternal uncle; Hypertension in her brother. There is no history of Diabetes.She reports that she has never smoked. She has never used smokeless tobacco. She reports that she does not drink alcohol or use illicit drugs.  Outpatient Prescriptions Prior to Visit  Medication Sig Dispense Refill  . cetirizine (ZYRTEC) 10 MG tablet Take 10 mg by mouth daily.    Marland Kitchen glucose blood test strip Use as instructed Dx: E11.9 100 each 12  . Lancet Devices (ONE TOUCH DELICA LANCING DEV) MISC Use as directed Dx: E11.9 1 each 0  . metFORMIN (GLUCOPHAGE) 500 MG tablet Take 2 tablets (1,000 mg total) by mouth at bedtime. 180 tablet 1  . ONETOUCH DELICA LANCETS 82N MISC Use  as directed Dx: E11.9 100 each 11  . pantoprazole (PROTONIX) 40 MG tablet Take 40 mg by mouth daily.    . progesterone (PROMETRIUM) 100 MG capsule Take 100 mg by mouth daily.    . traMADol (ULTRAM) 50 MG tablet Take 1 tablet by mouth as needed.    . cyclobenzaprine (FLEXERIL) 10 MG tablet Take 1 tablet (10 mg total) by mouth every 8 (eight) hours as needed for muscle spasms. 30 tablet 0  . meloxicam (MOBIC) 15 MG tablet Take 1 tablet (15 mg total) by mouth daily. (Patient not taking: Reported on 08/12/2015) 30 tablet 0   No facility-administered medications prior to visit.    ROS Review of Systems  Constitutional: Negative for fever, chills, diaphoresis and fatigue.  Respiratory: Negative for chest tightness, shortness of breath and wheezing.   Cardiovascular: Negative for chest pain, palpitations and leg swelling.  Gastrointestinal: Negative for nausea, vomiting and diarrhea.  Musculoskeletal: Positive for arthralgias.       Hip and back chronic pain  Skin: Negative for rash.  Neurological: Negative for dizziness, weakness, numbness and headaches.  Psychiatric/Behavioral: The patient is not nervous/anxious.     Objective:  BP 132/86 mmHg  Pulse 84  Resp 16  Ht '5\' 5"'$  (1.651 m)  Wt 230 lb (104.327 kg)  BMI 38.27 kg/m2  SpO2 98%   Physical Exam  Constitutional: She is oriented to person, place, and time. She appears well-developed and well-nourished. No distress.  HENT:  Head: Normocephalic and atraumatic.  Right Ear: External ear normal.  Left Ear: External ear normal.  Eyes: EOM are normal. Pupils are equal, round, and reactive to light. Right eye exhibits no discharge. Left eye exhibits no discharge. No scleral icterus.  Cardiovascular: Normal rate, regular rhythm and normal heart sounds.  Exam reveals no gallop and no friction rub.   No murmur heard. Pulmonary/Chest: Effort normal and breath sounds normal. No respiratory distress. She has no wheezes. She has no rales. She  exhibits no tenderness.  Abdominal:  Obese  Neurological: She is alert and oriented to person, place, and time. No cranial nerve deficit. She exhibits normal muscle tone. Coordination normal.  Skin: Skin is warm and dry. No rash noted. She is not diaphoretic.  Psychiatric: She has a normal mood and affect. Her behavior is normal. Judgment and thought content normal.   Assessment & Plan:   Brittany Santos was seen today for follow-up.  Diagnoses and all orders for this visit:  Type 2 diabetes mellitus without complication, without long-term current use of insulin (HCC) -     HgB A1c -     Comp Met (CMET) -     Lipid Profile   I have discontinued Ms. Mukherjee's meloxicam and cyclobenzaprine. I am also having her maintain her pantoprazole, traMADol, cetirizine, metFORMIN, progesterone, ONE TOUCH DELICA LANCING DEV, ONETOUCH DELICA LANCETS 16W, glucose blood, BIOTIN PO, and vitamin B-12.  Meds ordered this encounter  Medications  . BIOTIN PO    Sig: Take 1 tablet by mouth daily.  . vitamin B-12 (CYANOCOBALAMIN) 100 MCG tablet    Sig: Take 100 mcg by mouth daily.     Follow-up: Return in about 6 months (around 06/19/2016) for follow up on diabetes.

## 2015-12-21 NOTE — Assessment & Plan Note (Signed)
Need to refill protonix in 3 months. Asked her to call and let us know. She would like to see another GI group. Referral placed

## 2015-12-21 NOTE — Addendum Note (Signed)
Addended by: Carilyn Goodpasture on: 12/21/2015 09:21 AM   Modules accepted: Orders

## 2015-12-21 NOTE — Assessment & Plan Note (Signed)
Seen by Jefm Bryant. Pt would like to see GI within our /[facility] system. Stable w/ dietary changes. H/o of colonoscopy in 2016 external results not available via [facility].

## 2015-12-21 NOTE — Assessment & Plan Note (Signed)
Biotin is helpful. No concerns today. Normal thyroid panel in August.

## 2015-12-21 NOTE — Addendum Note (Signed)
Addended by: Rubbie Battiest on: 12/21/2015 08:43 AM   Modules accepted: Level of Service

## 2015-12-21 NOTE — Patient Instructions (Signed)
Thank you for getting your Tdap (tetanus with whooping cough) today!   Work on [employer] and exercise.   Nice to see you today. Let us know if you need any medications refilled in the future.

## 2015-12-21 NOTE — Assessment & Plan Note (Signed)
Stable at this time. CMET, A1c, and lipid profile obtained today. Podiatry visit for growth on side of foot completed last week. Normal foot exam and they took care of the growth she reported. Denies need for refills. Advised pt on exercise and dietary goals for the upcoming year. Work on getting back into a routine. FU in 6 months

## 2015-12-23 ENCOUNTER — Telehealth: Payer: Self-pay | Admitting: *Deleted

## 2015-12-23 NOTE — Telephone Encounter (Signed)
Patient has requested her lab results from 12/21/15

## 2015-12-23 NOTE — Telephone Encounter (Signed)
Please advise lab results, they are in [facility] for review. thanks

## 2015-12-23 NOTE — Telephone Encounter (Signed)
Having [facility] Call her with results today.Thanks!

## 2016-03-14 ENCOUNTER — Encounter: Payer: Self-pay | Admitting: Internal Medicine

## 2016-03-24 ENCOUNTER — Other Ambulatory Visit: Payer: Self-pay | Admitting: Nurse Practitioner

## 2016-03-28 ENCOUNTER — Other Ambulatory Visit: Payer: Self-pay | Admitting: Gastroenterology

## 2016-03-28 DIAGNOSIS — R1310 Dysphagia, unspecified: Secondary | ICD-10-CM

## 2016-04-03 ENCOUNTER — Ambulatory Visit
Admission: RE | Admit: 2016-04-03 | Discharge: 2016-04-03 | Disposition: A | Discharge status: Home / Self Care | Payer: BLUE CROSS/BLUE SHIELD | Source: Ambulatory Visit | Attending: Gastroenterology | Admitting: Gastroenterology

## 2016-04-03 DIAGNOSIS — R1310 Dysphagia, unspecified: Secondary | ICD-10-CM | POA: Insufficient documentation

## 2016-04-03 DIAGNOSIS — K219 Gastro-esophageal reflux disease without esophagitis: Secondary | ICD-10-CM | POA: Diagnosis not present

## 2016-04-05 ENCOUNTER — Ambulatory Visit: Payer: BLUE CROSS/BLUE SHIELD

## 2016-05-08 ENCOUNTER — Ambulatory Visit: Payer: BLUE CROSS/BLUE SHIELD | Admitting: Internal Medicine

## 2016-05-11 ENCOUNTER — Encounter: Payer: Self-pay | Admitting: *Deleted

## 2016-05-12 ENCOUNTER — Encounter: Payer: Self-pay | Admitting: *Deleted

## 2016-05-12 ENCOUNTER — Ambulatory Visit: Payer: BLUE CROSS/BLUE SHIELD | Admitting: Anesthesiology

## 2016-05-12 ENCOUNTER — Encounter
Admission: RE | Disposition: A | Discharge status: Home / Self Care | Payer: Self-pay | Source: Ambulatory Visit | Attending: Gastroenterology

## 2016-05-12 ENCOUNTER — Ambulatory Visit
Admission: RE | Admit: 2016-05-12 | Discharge: 2016-05-12 | Disposition: A | Discharge status: Home / Self Care | Payer: BLUE CROSS/BLUE SHIELD | Source: Ambulatory Visit | Attending: Gastroenterology | Admitting: Gastroenterology

## 2016-05-12 DIAGNOSIS — Z86010 Personal history of colonic polyps: Secondary | ICD-10-CM | POA: Diagnosis not present

## 2016-05-12 DIAGNOSIS — Z7984 Long term (current) use of oral hypoglycemic drugs: Secondary | ICD-10-CM | POA: Diagnosis not present

## 2016-05-12 DIAGNOSIS — E119 Type 2 diabetes mellitus without complications: Secondary | ICD-10-CM | POA: Insufficient documentation

## 2016-05-12 DIAGNOSIS — Z87442 Personal history of urinary calculi: Secondary | ICD-10-CM | POA: Insufficient documentation

## 2016-05-12 DIAGNOSIS — M1990 Unspecified osteoarthritis, unspecified site: Secondary | ICD-10-CM | POA: Diagnosis not present

## 2016-05-12 DIAGNOSIS — K219 Gastro-esophageal reflux disease without esophagitis: Secondary | ICD-10-CM | POA: Diagnosis present

## 2016-05-12 DIAGNOSIS — Z8719 Personal history of other diseases of the digestive system: Secondary | ICD-10-CM | POA: Insufficient documentation

## 2016-05-12 DIAGNOSIS — K317 Polyp of stomach and duodenum: Secondary | ICD-10-CM | POA: Insufficient documentation

## 2016-05-12 DIAGNOSIS — Z79899 Other long term (current) drug therapy: Secondary | ICD-10-CM | POA: Diagnosis not present

## 2016-05-12 DIAGNOSIS — R1310 Dysphagia, unspecified: Secondary | ICD-10-CM | POA: Diagnosis present

## 2016-05-12 DIAGNOSIS — K224 Dyskinesia of esophagus: Secondary | ICD-10-CM | POA: Insufficient documentation

## 2016-05-12 DIAGNOSIS — K2960 Other gastritis without bleeding: Secondary | ICD-10-CM | POA: Diagnosis not present

## 2016-05-12 DIAGNOSIS — K3189 Other diseases of stomach and duodenum: Secondary | ICD-10-CM | POA: Diagnosis not present

## 2016-05-12 HISTORY — PX: ESOPHAGOGASTRODUODENOSCOPY (EGD) WITH PROPOFOL: SHX5813

## 2016-05-12 LAB — GLUCOSE, CAPILLARY: GLUCOSE-CAPILLARY: 124 mg/dL — AB (ref 65–99)

## 2016-05-12 SURGERY — ESOPHAGOGASTRODUODENOSCOPY (EGD) WITH PROPOFOL
Anesthesia: General

## 2016-05-12 MED ORDER — GLYCOPYRROLATE 0.2 MG/ML IJ SOLN
INTRAMUSCULAR | Status: DC | PRN
  Administered 2016-05-12: 0.2 mg via INTRAVENOUS

## 2016-05-12 MED ORDER — FENTANYL CITRATE (PF) 100 MCG/2ML IJ SOLN
INTRAMUSCULAR | Status: DC | PRN
  Administered 2016-05-12 (×2): 50 ug via INTRAVENOUS

## 2016-05-12 MED ORDER — SODIUM CHLORIDE 0.9 % IV SOLN
INTRAVENOUS | Status: DC
  Administered 2016-05-12: 11:00:00 via INTRAVENOUS

## 2016-05-12 MED ORDER — PROPOFOL 10 MG/ML IV BOLUS
INTRAVENOUS | Status: DC | PRN
  Administered 2016-05-12: 30 mg via INTRAVENOUS
  Administered 2016-05-12 (×5): 50 mg via INTRAVENOUS

## 2016-05-12 MED ORDER — MIDAZOLAM HCL 2 MG/2ML IJ SOLN
INTRAMUSCULAR | Status: DC | PRN
  Administered 2016-05-12: 2 mg via INTRAVENOUS

## 2016-05-12 MED ORDER — ONDANSETRON HCL 4 MG/2ML IJ SOLN
INTRAMUSCULAR | Status: DC | PRN
  Administered 2016-05-12: 4 mg via INTRAVENOUS

## 2016-05-12 MED ORDER — SODIUM CHLORIDE 0.9 % IV SOLN: INTRAVENOUS | Status: DC

## 2016-05-12 MED ORDER — PHENYLEPHRINE HCL 10 MG/ML IJ SOLN
INTRAMUSCULAR | Status: DC | PRN
  Administered 2016-05-12: 50 ug via INTRAVENOUS

## 2016-05-12 NOTE — H&P (Signed)
Outpatient short stay form Pre-procedure 05/12/2016 11:09 AM Lollie Sails MD  Primary Physician: Dr. Flonnie Overman  Reason for visit:  EGD  History of present illness:  Patient is a 55 year old female presenting today for EGD. Several weeks ago she had an episode lasted for a couple of weeks of some dysphagia and increased heartburn symptoms. She relates a better patient in the mouth as well as a burning taste at times. She has had some altered with swallowing since a cervical surgery several years ago. She was placed on a twice a day PPI which has been beneficial and her symptoms are much better. She denies use of any blood thinning products or aspirin products. She did have a barium swallow on 04/03/2016 that showed moderate gastroesophageal reflux and normal passage of a 13 mm barium tablet. There is no evidence of irregularity or ulceration there is no evidence of stricture or mass.  Current facility-administered medications:  .  0.9 %  sodium chloride infusion, , Intravenous, Continuous, Lollie Sails, MD, Last Rate: 20 mL/hr at 05/12/16 1049 .  0.9 %  sodium chloride infusion, , Intravenous, Continuous, Lollie Sails, MD  Prescriptions prior to admission  Medication Sig Dispense Refill Last Dose  . alum & mag hydroxide-simeth (MAALOX/MYLANTA) 200-200-20 MG/5ML suspension Take 30 mLs by mouth every 6 (six) hours as needed for indigestion or heartburn.     . dexlansoprazole (DEXILANT) 60 MG capsule Take 60 mg by mouth daily.     Marland Kitchen etodolac (LODINE) 500 MG tablet Take 500 mg by mouth 2 (two) times daily.     . metFORMIN (GLUCOPHAGE) 500 MG tablet TAKE 2 TABLETS (1,000 MG   TOTAL) AT BEDTIME 180 tablet 1 05/11/2016 at Unknown time  . Multiple Vitamin (MULTIVITAMIN) capsule Take 1 capsule by mouth daily.   Past Week at Unknown time  . pantoprazole (PROTONIX) 40 MG tablet Take 40 mg by mouth daily.   05/11/2016 at Unknown time  . progesterone (PROMETRIUM) 100 MG capsule Take 100 mg by mouth  daily.   05/11/2016 at Unknown time  . ranitidine (ZANTAC) 150 MG capsule Take 150 mg by mouth daily.     . traMADol (ULTRAM) 50 MG tablet Take 1 tablet by mouth as needed.   05/11/2016 at Unknown time  . BIOTIN PO Take 1 tablet by mouth daily.     . cetirizine (ZYRTEC) 10 MG tablet Take 10 mg by mouth daily.   Taking  . glucose blood test strip Use as instructed Dx: E11.9 100 each 12   . Lancet Devices (ONE TOUCH DELICA LANCING DEV) MISC Use as directed Dx: E11.9 1 each 0   . ONETOUCH DELICA LANCETS 99991111 MISC Use as directed Dx: E11.9 100 each 11   . vitamin B-12 (CYANOCOBALAMIN) 100 MCG tablet Take 100 mcg by mouth daily.        Allergies  Allergen Reactions  . Vicodin [Hydrocodone-Acetaminophen] Itching and Rash     Past Medical History  Diagnosis Date  . Diabetes mellitus without complication ([place])   . Arthritis   . History of diverticulitis   . Allergy   . GERD (gastroesophageal reflux disease)   . History of colon polyps   . History of kidney stones 04/15/15    Review of systems:      Physical Exam    Heart and lungs: Regular rate and rhythm without rub or gallop, lungs are bilaterally clear.    HEENT:  normocephalic atraumatic eyes are anicteric    Other:  Pertinant exam for procedure:  nontender nondistended bowel sounds positive normoactive.    Planned proceedures:  EGD and indicated procedures. I have discussed the risks benefits and complications of procedures to include not limited to bleeding, infection, perforation and the risk of sedation and the patient wishes to proceed.    Lollie Sails, MD Gastroenterology 05/12/2016  11:09 AM

## 2016-05-12 NOTE — Op Note (Signed)
[facility] Gastroenterology Patient Name: Mariadelosangel [id number] Procedure Date: 05/12/2016 11:08 AM MRN: [id number] Account #: [id number] Date of Birth: March 07, 1961 Admit Type: Outpatient Age: 55 Room: [facility] ENDO ROOM 1 Gender: Female Note Status: Finalized Procedure:            Upper GI endoscopy Indications:          Dysphagia, Gastro-esophageal reflux disease Providers:            Lollie Sails, MD Referring MD:         Barnie Del. Lacinda Axon (Referring MD) Medicines:            Monitored Anesthesia Care Complications:        No immediate complications. Procedure:            Pre-Anesthesia Assessment:                       - ASA Grade Assessment: II - A patient with mild                        systemic disease.                       After obtaining informed consent, the endoscope was                        passed under direct vision. Throughout the procedure,                        the patient's blood pressure, pulse, and oxygen                        saturations were monitored continuously. The Endoscope                        was introduced through the mouth, and advanced to the                        third part of duodenum. The upper GI endoscopy was                        accomplished without difficulty. The patient tolerated                        the procedure well. Findings:      The Z-line was variable. Biopsies were taken with a cold forceps for       histology.      Abnormal motility was noted in the mid esophagus and in the distal       esophagus. The cricopharyngeus was normal. There is spasticity of the       esophageal body. Tertiary peristaltic waves are noted.      Diffuse mild inflammation characterized by congestion (edema) and       erythema was found in the gastric body and in the gastric antrum.       Biopsies were taken with a cold forceps for histology.      Multiple 1 to 4 mm sessile polyps with no bleeding and no stigmata of       recent  bleeding were found in the gastric body. Biopsies were taken with       a cold forceps for histology.  The cardia and gastric fundus were normal on retroflexion.      The examined duodenum was normal. Impression:           - Z-line variable. Biopsied.                       - Abnormal esophageal motility, suspicious for                        esophageal spasm.                       - Bile gastritis. Biopsied.                       - Multiple gastric polyps. Biopsied.                       - Normal examined duodenum. Recommendation:       - Use Protonix (pantoprazole) 40 mg PO daily daily.                       - Use sucralfate suspension 1 gram PO QID for 1 month. Procedure Code(s):    --- Professional ---                       [phone], Esophagogastroduodenoscopy, flexible, transoral;                        with biopsy, single or multiple Diagnosis Code(s):    --- Professional ---                       K22.8, Other specified diseases of esophagus                       K22.4, Dyskinesia of esophagus                       K29.60, Other gastritis without bleeding                       K31.7, Polyp of stomach and duodenum                       R13.10, Dysphagia, unspecified                       K21.9, Gastro-esophageal reflux disease without                        esophagitis CPT copyright 2016 American Medical Association. All rights reserved. The codes documented in this report are preliminary and upon coder review may  be revised to meet current compliance requirements. Lollie Sails, MD 05/12/2016 11:38:12 AM This report has been signed electronically. Number of Addenda: 0 Note Initiated On: 05/12/2016 11:08 AM      [facility]

## 2016-05-12 NOTE — Anesthesia Preprocedure Evaluation (Signed)
Anesthesia Evaluation  Patient identified by MRN, date of birth, ID band Patient awake    Reviewed: Allergy & Precautions, H&P , NPO status , Patient's Chart, lab work & pertinent test results, reviewed documented beta blocker date and time   Airway Mallampati: II   Neck ROM: full    Dental  (+) Teeth Intact   Pulmonary neg pulmonary ROS,    Pulmonary exam normal        Cardiovascular Exercise Tolerance: Good (-) anginanegative cardio ROS Normal cardiovascular exam Rhythm:regular Rate:Normal     Neuro/Psych negative neurological ROS  negative psych ROS   GI/Hepatic negative GI ROS, Neg liver ROS, GERD  Medicated,  Endo/Other  negative endocrine ROSdiabetes  Renal/GU negative Renal ROS  negative genitourinary   Musculoskeletal   Abdominal   Peds  Hematology negative hematology ROS (+)   Anesthesia Other Findings Past Medical History:   Diabetes mellitus without complication (HCC)                 Arthritis                                                    History of diverticulitis                                    Allergy                                                      GERD (gastroesophageal reflux disease)                       History of colon polyps                                      History of kidney stones                        04/15/15    Past Surgical History:   BREAST SURGERY                                                BILATERAL CARPAL TUNNEL RELEASE                               CHOLECYSTECTOMY                                               cervical neck fusion                                        BMI    Body Mass  Index   36.77 kg/m 2     Reproductive/Obstetrics                             Anesthesia Physical Anesthesia Plan  ASA: II  Anesthesia Plan: General   Post-op Pain Management:    Induction:   Airway Management Planned:   Additional Equipment:    Intra-op Plan:   Post-operative Plan:   Informed Consent: I have reviewed the patients History and Physical, chart, labs and discussed the procedure including the risks, benefits and alternatives for the proposed anesthesia with the patient or authorized representative who has indicated his/her understanding and acceptance.   Dental Advisory Given  Plan Discussed with: CRNA  Anesthesia Plan Comments:         Anesthesia Quick Evaluation

## 2016-05-12 NOTE — Transfer of Care (Signed)
Immediate Anesthesia Transfer of Care Note  Patient: Brittany Santos [id number]  Procedure(s) Performed: Procedure(s): ESOPHAGOGASTRODUODENOSCOPY (EGD) WITH PROPOFOL (N/A)  Patient Location: PACU  Anesthesia Type:General  Level of Consciousness: awake, alert  and oriented  Airway & Oxygen Therapy: Patient Spontanous Breathing and Patient connected to nasal cannula oxygen  Post-op Assessment: Report given to RN and Post -op Vital signs reviewed and stable  Post vital signs: Reviewed and stable  Last Vitals:  Filed Vitals:   05/12/16 1030  BP: 151/92  Pulse: 90  Temp: 36.7 C  Resp: 18    Last Pain:  Filed Vitals:   05/12/16 1031  PainSc: 8       Patients Stated Pain Goal: 5 ([phone]  Complications: No apparent anesthesia complications

## 2016-05-15 ENCOUNTER — Encounter: Payer: Self-pay | Admitting: Gastroenterology

## 2016-05-16 LAB — SURGICAL PATHOLOGY

## 2016-05-16 NOTE — Anesthesia Postprocedure Evaluation (Signed)
Anesthesia Post Note  Patient: Brittany Santos [id number]  Procedure(s) Performed: Procedure(s) (LRB): ESOPHAGOGASTRODUODENOSCOPY (EGD) WITH PROPOFOL (N/A)  Patient location during evaluation: PACU Anesthesia Type: General Level of consciousness: awake and alert Pain management: pain level controlled Vital Signs Assessment: post-procedure vital signs reviewed and stable Respiratory status: spontaneous breathing, nonlabored ventilation, respiratory function stable and patient connected to nasal cannula oxygen Cardiovascular status: blood pressure returned to baseline and stable Postop Assessment: no signs of nausea or vomiting Anesthetic complications: no    Last Vitals:  Filed Vitals:   05/12/16 1200 05/12/16 1210  BP: 129/81 125/79  Pulse: 71 69  Temp:    Resp: 18 17    Last Pain:  Filed Vitals:   05/12/16 1211  PainSc: [place] [name]

## 2016-06-19 ENCOUNTER — Ambulatory Visit (INDEPENDENT_AMBULATORY_CARE_PROVIDER_SITE_OTHER): Payer: BLUE CROSS/BLUE SHIELD | Admitting: Family Medicine

## 2016-06-19 ENCOUNTER — Ambulatory Visit: Payer: BLUE CROSS/BLUE SHIELD | Admitting: Nurse Practitioner

## 2016-06-19 ENCOUNTER — Encounter: Payer: Self-pay | Admitting: Family Medicine

## 2016-06-19 VITALS — BP 122/70 | HR 81 | Temp 97.7°F | Ht 63.25 in | Wt 225.0 lb

## 2016-06-19 DIAGNOSIS — L989 Disorder of the skin and subcutaneous tissue, unspecified: Secondary | ICD-10-CM

## 2016-06-19 DIAGNOSIS — E119 Type 2 diabetes mellitus without complications: Secondary | ICD-10-CM

## 2016-06-19 LAB — LIPID PANEL
CHOL/HDL RATIO: 2
Cholesterol: 135 mg/dL (ref 0–200)
HDL: 55 mg/dL (ref >39.00)
LDL CALC: 66 mg/dL (ref 0–99)
NONHDL: 79.57
TRIGLYCERIDES: 69 mg/dL (ref 0.0–149.0)
VLDL: 13.8 mg/dL (ref 0.0–40.0)

## 2016-06-19 LAB — MICROALBUMIN / CREATININE URINE RATIO
CREATININE, U: 105.4 mg/dL
Microalb Creat Ratio: 0.7 mg/g (ref 0.0–30.0)

## 2016-06-19 LAB — HEMOGLOBIN A1C: HEMOGLOBIN A1C: 6.4 % (ref 4.6–6.5)

## 2016-06-19 MED ORDER — BETAMETHASONE DIPROPIONATE 0.05 % EX OINT: TOPICAL_OINTMENT | Freq: Two times a day (BID) | CUTANEOUS | Status: DC

## 2016-06-19 NOTE — Patient Instructions (Signed)
Continue your current medications.  You're doing well.  Use the topical steroid as prescribed.  Follow up in 6 months.  If you would like to contact the dermatologist, I recommend Dr. Nehemiah Massed ([facility]).  Take care  Dr. Lacinda Axon

## 2016-06-19 NOTE — Assessment & Plan Note (Signed)
Established problem, well controlled. A1C today. Continue metformin. Foot exam and microalbumin today.

## 2016-06-19 NOTE — Progress Notes (Signed)
Subjective:  Patient ID: Brittany Santos [id number], female    DOB: Oct 01, 1961  Age: 55 y.o. MRN: [id number]  CC: Follow up  HPI:  55 year old female presents for follow-up regarding her diabetes. She also complains of a few skin areas that she would like looked at.  DM  Blood sugars readings - [place] in the 130's.  Hypoglycemia - No.  Medications - Metformin.   Adverse effects - None.  Compliance - Yes.  Preventative care  Eye exam - Up to date.  Foot exam - Needs today.  Last A1C - Needs today.  Urine microalbumin - Needs today.   Candidate for aspirin - Not per USPSTF (ASCVD risk score 2.9%)  Candidate for statin - Yes but LDL well controlled. Lipid panel today.  Skin lesion  Patient has had 2 skin lesions on her left lower leg for quite some time (months to year).  She reports recent increase in redness.  No change in size.  Not itchy or painful.  She's been treated in the past with topical steroid with no improvement.  She would like these examined today.  Social Hx   Social History   Social History  . Marital Status: Single    Spouse Name: N/A  . Number of Children: N/A  . Years of Education: N/A   Social History Main Topics  . Smoking status: Never Smoker   . Smokeless tobacco: Never Used  . Alcohol Use: No  . Drug Use: No  . Sexual Activity: No   Other Topics Concern  . None   Social History Narrative   Works- [employer]    Mother lives with pt   No children    1 dog lives inside    Right handed    Caffeine- 1 piece chocolate occasionally    Associate degree   Enjoys- gardening and going to concerts    Review of Systems  Constitutional: Negative.   Musculoskeletal: Positive for back pain.  Skin:       Skin lesions.   Objective:  BP 122/70 mmHg  Pulse 81  Temp(Src) 97.7 F (36.5 C) (Oral)  Ht 5' 3.25" (1.607 m)  Wt 225 lb (102.059 kg)  BMI 39.52 kg/m2  SpO2 97%  LMP  (LMP Unknown)  BP/Weight 06/19/2016 05/12/2016  [id number]  Systolic BP [id number]  Diastolic BP 70 79 86  Wt. (Lbs) 225 221 230  BMI 39.52 36.78 38.27   Physical Exam  Constitutional: She is oriented to person, place, and time. She appears well-developed. No distress.  Cardiovascular: Normal rate and regular rhythm.   Pulmonary/Chest: Effort normal. She has no wheezes. She has no rales.  Neurological: She is alert and oriented to person, place, and time.  Skin:  Left lower leg - 2 small circumferential skin lesions with erythema. Border with scaling/dryness. Patient is an additional small lesion on the right lower leg which is the same as the above.  Psychiatric: She has a normal mood and affect.  Vitals reviewed.  Lab Results  Component Value Date   WBC 10.1 10/21/2015   HGB 13.6 10/21/2015   HCT 41.3 10/21/2015   PLT 200 10/21/2015   GLUCOSE 148* 12/21/2015   CHOL 143 12/21/2015   TRIG 76.0 12/21/2015   HDL 52.80 12/21/2015   LDLCALC 75 12/21/2015   ALT 14 12/21/2015   AST 16 12/21/2015   NA 138 12/21/2015   K 3.8 12/21/2015   CL 101 12/21/2015   CREATININE 0.68 12/21/2015  BUN 16 12/21/2015   CO2 32 12/21/2015   TSH 2.05 08/12/2015   INR 1.0 06/07/2009   HGBA1C 5.9 12/21/2015   MICROALBUR 1.3 04/21/2015   Assessment & Plan:   Problem List Items Addressed This Visit    Type 2 diabetes mellitus ([place]) - Primary    Established problem, well controlled. A1C today. Continue metformin. Foot exam and microalbumin today.      Relevant Orders   Lipid Profile   HgB A1c   Urine Microalbumin w/creat. ratio   Skin lesion    New problem. Treating with high potency topical steroid.         Meds ordered this encounter  Medications  . betamethasone dipropionate (DIPROLENE) 0.05 % ointment    Sig: Apply topically 2 (two) times daily.    Dispense:  30 g    Refill:  0    Follow-up: Return in about 6 months (around 12/20/2016).  [place]

## 2016-06-19 NOTE — Assessment & Plan Note (Signed)
New problem. Treating with high potency topical steroid.

## 2016-06-21 ENCOUNTER — Telehealth: Payer: Self-pay | Admitting: *Deleted

## 2016-06-21 NOTE — Telephone Encounter (Signed)
Patient requested lab results from 06/19/16

## 2016-06-21 NOTE — Telephone Encounter (Signed)
Spoke with patient, see result note for details. thanks

## 2016-07-03 ENCOUNTER — Other Ambulatory Visit: Payer: Self-pay | Admitting: Sports Medicine

## 2016-07-03 DIAGNOSIS — S32000A Wedge compression fracture of unspecified lumbar vertebra, initial encounter for closed fracture: Secondary | ICD-10-CM

## 2016-07-06 ENCOUNTER — Ambulatory Visit
Admission: RE | Admit: 2016-07-06 | Discharge: 2016-07-06 | Disposition: A | Discharge status: Home / Self Care | Payer: BLUE CROSS/BLUE SHIELD | Source: Ambulatory Visit | Attending: Sports Medicine | Admitting: Sports Medicine

## 2016-07-06 ENCOUNTER — Other Ambulatory Visit: Payer: Self-pay | Admitting: Sports Medicine

## 2016-07-06 ENCOUNTER — Other Ambulatory Visit: Payer: Self-pay

## 2016-07-06 DIAGNOSIS — S32000A Wedge compression fracture of unspecified lumbar vertebra, initial encounter for closed fracture: Secondary | ICD-10-CM

## 2016-07-06 DIAGNOSIS — M5416 Radiculopathy, lumbar region: Secondary | ICD-10-CM

## 2016-07-06 DIAGNOSIS — G8929 Other chronic pain: Secondary | ICD-10-CM | POA: Insufficient documentation

## 2016-07-06 DIAGNOSIS — M549 Dorsalgia, unspecified: Secondary | ICD-10-CM

## 2016-07-06 DIAGNOSIS — M545 Low back pain: Principal | ICD-10-CM

## 2016-07-06 DIAGNOSIS — E669 Obesity, unspecified: Secondary | ICD-10-CM | POA: Insufficient documentation

## 2016-07-06 NOTE — Consult Note (Signed)
Chief Complaint: Patient was seen in consultation today for persistent low back pain associated with an L2 compression at the request of Home  Referring Physician(s): Kramer,James   Patient Status: Outpatient  History of Present Illness: Brittany Santos is a 55 y.o. female who experienced a fall 04/16/2016. She had a coccyx fracture and L2 fracture as a result of this fall. She experienced severe pain and was using a walker for the initial 3-4 weeks. The pain has since improved. She still carries a cane with her.   Her pain was improving, but has increased since returning to work 3 days ago. The pain is significant by the end of a full work day. She is limited to desk duty at this point.   She did have some right hip and lower extremity pain preceding the fall. This has been relieved with hip injections. The lower extremity pain recurred after the fall but improved again after a hip injection 2 weeks ago.   She does live independently and is able to perform activities of daily living.  She reports her pain as 5/10 on a visual analogue scale. She scored 19/24 on the [employer] disability questionnaire.    Past Medical History  Diagnosis Date  . Diabetes mellitus without complication ([place])   . Arthritis   . History of diverticulitis   . Allergy   . GERD (gastroesophageal reflux disease)   . History of colon polyps   . History of kidney stones 04/15/15    Past Surgical History  Procedure Laterality Date  . Breast surgery    . Bilateral carpal tunnel release    . Cholecystectomy    . Cervical neck fusion    . Esophagogastroduodenoscopy (egd) with propofol N/A 05/12/2016    Procedure: ESOPHAGOGASTRODUODENOSCOPY (EGD) WITH PROPOFOL;  Surgeon: Lollie Sails, MD;  Location: [facility] ENDOSCOPY;  Service: Endoscopy;  Laterality: N/A;    Allergies: Vicodin  Medications: Prior to Admission medications   Medication Sig Start Date End Date Taking? Authorizing  Provider  alum & mag hydroxide-simeth (MAALOX/MYLANTA) 200-200-20 MG/5ML suspension Take 30 mLs by mouth every 6 (six) hours as needed for indigestion or heartburn.    Historical Provider, MD  betamethasone dipropionate (DIPROLENE) 0.05 % ointment Apply topically 2 (two) times daily. 06/19/16   Coral Spikes, DO  BIOTIN PO Take 1 tablet by mouth daily.    Historical Provider, MD  cetirizine (ZYRTEC) 10 MG tablet Take 10 mg by mouth daily.    Historical Provider, MD  dexlansoprazole (DEXILANT) 60 MG capsule Take 60 mg by mouth daily.    Historical Provider, MD  glucose blood test strip Use as instructed Dx: E11.9 09/29/15   Rubbie Battiest, NP  Lancet Devices (ONE TOUCH DELICA LANCING DEV) MISC Use as directed Dx: E11.9 09/29/15   Rubbie Battiest, NP  metFORMIN (GLUCOPHAGE) 500 MG tablet TAKE 2 TABLETS (1,000 MG   TOTAL) AT BEDTIME 03/24/16   Rubbie Battiest, NP  Multiple Vitamin (MULTIVITAMIN) capsule Take 1 capsule by mouth daily.    Historical Provider, MD  [facility] LANCETS [id number] MISC Use as directed Dx: E11.9 09/29/15   Rubbie Battiest, NP  pantoprazole (PROTONIX) 40 MG tablet Take 40 mg by mouth daily. 12/23/14   Historical Provider, MD  progesterone (PROMETRIUM) 100 MG capsule Take 100 mg by mouth daily. 07/26/15   Elgie Collard, MD  RABEprazole (ACIPHEX) 20 MG tablet Take by mouth. 06/21/16   Historical Provider, MD  sucralfate (CARAFATE) 1  g tablet Take by mouth. 06/21/16   Historical Provider, MD  traMADol (ULTRAM) 50 MG tablet Take 1 tablet by mouth as needed.    Historical Provider, MD  vitamin B-12 (CYANOCOBALAMIN) 100 MCG tablet Take 100 mcg by mouth daily.    Historical Provider, MD     Family History  Problem Relation Age of Onset  . Hypertension Brother   . Diabetes Neg Hx   . Breast cancer Mother   . Cancer Sister 72    Blood cancer (unknown)  . Cancer Maternal Aunt     Lung  . Cancer Maternal Uncle 30    kidney  . Heart attack Paternal Uncle   . Aneurysm Paternal Grandmother      Social History   Social History  . Marital Status: Single    Spouse Name: N/A  . Number of Children: N/A  . Years of Education: N/A   Social History Main Topics  . Smoking status: Never Smoker   . Smokeless tobacco: Never Used  . Alcohol Use: No  . Drug Use: No  . Sexual Activity: No   Other Topics Concern  . Not on file   Social History Narrative   Works- [employer]    Mother lives with pt   No children    1 dog lives inside    Right handed    Caffeine- 1 piece chocolate occasionally    Associate degree   Enjoys- gardening and going to concerts     ECOG Status: 1 - Symptomatic but completely ambulatory  Review of Systems: A 12 point ROS discussed and pertinent positives are indicated in the HPI above.  All other systems are negative.  Review of Systems  Gastrointestinal:       Reflux  Musculoskeletal: Positive for back pain.       Right Hip pain  All other systems reviewed and are negative.   Vital Signs: BP 181/87 mmHg  Pulse 97  Temp(Src) 97.8 F (36.6 C) (Oral)  SpO2 97%  LMP  (LMP Unknown)  Physical Exam  Constitutional: She is oriented to person, place, and time. She appears well-developed.  Musculoskeletal:       Lumbar back: She exhibits bony tenderness.  Neurological: She is alert and oriented to person, place, and time.  Gait Normal  Vitals reviewed.    Imaging: Mr Lumbar Spine Wo Contrast  07/06/2016  CLINICAL DATA:  55 y/o F; chronic lower back and right hip pain. Patient fell 4/ 17 and fractured L2. EXAM: MRI LUMBAR SPINE WITHOUT CONTRAST TECHNIQUE: Multiplanar, multisequence MR imaging of the lumbar spine was performed. No intravenous contrast was administered. COMPARISON:  Lumbar MRI dated 04/28/2016. FINDINGS: Segmentation:  Standard. Alignment:  Physiologic. 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 There has been mild further interval loss  of height of the L2 vertebral body. There is no edema within the vertebral body to suggest acute or subacute fracture. There is no significant retropulsion of bony elements. No new compression deformity is identified. Bone marrow signal is otherwise unremarkable. There are multilevel small Schmorl's nodes that are unchanged. Conus medullaris: Extends to the L1-2 level and appears normal. Paraspinal and other soft tissues: Negative. Disc levels: T12-L1: Small disc bulge without significant foraminal narrowing or canal stenosis. L1-2: Small disc bulge without significant foraminal narrowing or canal stenosis. L2-3: Moderate disc bulge and mild ligamentum flavum hypertrophy with mild lateral recess and foraminal narrowing. No significant canal  stenosis. L3-4: Small disc bulge and mild facet and flavum hypertrophy with minimal bilateral foraminal narrowing. No significant canal stenosis. L4-5: Small disc bulge and mild facet and ligamentum flavum hypertrophy with minimal bilateral foraminal narrowing. No significant canal stenosis. L5-S1: No significant disc displacement, foraminal narrowing, or canal stenosis. IMPRESSION: 1. Mild interval loss of height of the L2 vertebral body, but no bone marrow edema to suggest acute or subacute fracture, likely interval change related to initial injury. No significant retropulsion of bony elements. 2. Mild lumbar spondylosis without significant interval change. No high-grade canal stenosis or foraminal narrowing. Electronically Signed   By: Kristine Garbe M.D.   On: 07/06/2016 15:51    Labs:  CBC:  Recent Labs  10/21/15 1724  WBC 10.1  HGB 13.6  HCT 41.3  PLT 200    COAGS: No results for input(s): INR, APTT in the last 8760 hours.  BMP:  Recent Labs  08/12/15 0836 10/21/15 1724 12/21/15 0833  NA 141 139 138  K 4.4 3.6 3.8  CL 104 103 101  CO2 29 30 32  GLUCOSE 127* 161* 148*  BUN 14 18 16   CALCIUM 9.4 9.2 9.6  CREATININE 0.68 0.73 0.68   GFRNONAA  --  >60  --   GFRAA  --  >60  --     LIVER FUNCTION TESTS:  Recent Labs  12/21/15 0833  BILITOT 0.6  AST 16  ALT 14  ALKPHOS 64  PROT 7.0  ALBUMIN 4.0    TUMOR MARKERS: No results for input(s): AFPTM, CEA, CA199, CHROMGRNA in the last 8760 hours.  Assessment and Plan:  Symptomatic but healing L2 osteoporotic compression fracture.   By imaging, the fracture appears to be mostly healed. The patient also reports significant improvement in her symptoms over time. The symptoms are more recently exacerbated by a return to work. She appears to be on a good path to healing. I would recommend a shorter duration of her work day for up to 2 weeks. If she does not continue to improve in her pain, vertebral augmentation could be considered.   I discussed the risks and benefits of vertebral augmentation. The patient wishes to defer treatment at this time. I would be happy to see her again if the symptoms persist or progress at any point.    Thank you for this interesting consult.  I greatly enjoyed meeting Kaiden Licea [id number] and look forward to participating in their care.  A copy of this report was sent to the requesting provider on this date.  Electronically Signed: Kaity Pitstick W 07/06/2016, 5:03 PM   I spent a total of  30 Minutes   in face to face in clinical consultation, greater than 50% of which was counseling/coordinating care for Mrs. Weightman.

## 2016-07-13 ENCOUNTER — Other Ambulatory Visit: Payer: BLUE CROSS/BLUE SHIELD

## 2016-07-18 ENCOUNTER — Ambulatory Visit: Payer: BLUE CROSS/BLUE SHIELD | Attending: Sports Medicine | Admitting: Physical Therapy

## 2016-07-18 DIAGNOSIS — M545 Low back pain, unspecified: Secondary | ICD-10-CM

## 2016-07-18 DIAGNOSIS — R262 Difficulty in walking, not elsewhere classified: Secondary | ICD-10-CM | POA: Diagnosis present

## 2016-07-20 ENCOUNTER — Ambulatory Visit: Payer: BLUE CROSS/BLUE SHIELD | Admitting: Physical Therapy

## 2016-07-20 DIAGNOSIS — M545 Low back pain, unspecified: Secondary | ICD-10-CM

## 2016-07-20 DIAGNOSIS — R262 Difficulty in walking, not elsewhere classified: Secondary | ICD-10-CM

## 2016-07-20 NOTE — Therapy (Signed)
[facility] [address] [place], [address] Phone: [phone]   Fax:  [phone]  Physical Therapy Treatment  Patient Details  Name: Brittany Santos [id number] MRN: [id number] Date of Birth: 15-Nov-1961 No Data Recorded  Encounter Date: 07/20/2016      PT End of Session - 07/20/16 1616    Visit Number 2   Number of Visits 17   Date for PT Re-Evaluation 09/21/16   PT Start Time 1400   PT Stop Time 1445   PT Time Calculation (min) 45 min   Activity Tolerance Patient tolerated treatment well;Patient limited by pain   Behavior During Therapy [facility] for tasks assessed/performed      Past Medical History:  Diagnosis Date  . Allergy   . Arthritis   . Diabetes mellitus without complication ([place])   . GERD (gastroesophageal reflux disease)   . History of colon polyps   . History of diverticulitis   . History of kidney stones 04/15/15    Past Surgical History:  Procedure Laterality Date  . BILATERAL CARPAL TUNNEL RELEASE    . BREAST SURGERY    . cervical neck fusion    . CHOLECYSTECTOMY    . ESOPHAGOGASTRODUODENOSCOPY (EGD) WITH PROPOFOL N/A 05/12/2016   Procedure: ESOPHAGOGASTRODUODENOSCOPY (EGD) WITH PROPOFOL;  Surgeon: Lollie Sails, MD;  Location: [facility] ENDOSCOPY;  Service: Endoscopy;  Laterality: N/A;    There were no vitals filed for this visit.      Subjective Assessment - 07/20/16 1403    Subjective Patient reports she felt good after the evaluation for that day, however she felt more sore the next day. Today, which is 2 days later she is having more pain with sit to stand transition, but reports this is normal for being later in the week.    Limitations Sitting;Lifting;Standing;Walking;House hold activities   Diagnostic tests X-ray/MRI indicating L2 compression fracture, though "healing nicely" per last scan.    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 Yes   Pain Score 7    Pain Location Back   Pain Orientation Right;Left;Lower   Pain Descriptors / Indicators Aching   Pain Type Chronic pain   Pain Onset More than a month ago   Pain Frequency Constant   Aggravating Factors  Transitioning from sitting to standing.    Pain Relieving Factors Lying on L side, or walking (not too far).             [facility] PT Assessment - 07/20/16 0957      Assessment   Medical Diagnosis --  L2 compression fx   Onset Date/Surgical Date 04/16/16     Precautions   Precautions --  Spinal - NO MANUAL     Restrictions   Weight Bearing Restrictions No     Balance Screen   Has the patient fallen in the past 6 months Yes   How many times? 1   Has the patient had a decrease in activity level because of a fear of falling?  Yes     Prior Function   Level of Independence Independent   Vocation Full time employment   Vocation Requirements --  Inspecting cars, lifting equipment   Leisure --  Goes to concerts     Cognition   Overall Cognitive Status Within Functional Limits for tasks assessed     Observation/Other Assessments   Modified [place] 60     Sensation  Light Touch Appears Intact     Ambulation/Gait   Assistive device Straight cane     Standardized Balance Assessment   10 Meter Walk 15.73     Soft tissue mobilization over tender area (noted from roughly L2 distally and laterally to the R roughly 3-5", initially quite painful, however she was able to tolerate increased depth after continued repetitions.   Sidelying on L, isometric clamshells with 2 units with [place] Stim 10:10 setting x 5 minutes at 13.0 mA setting (patient reported fatigue not pain) -- contracted when on, relaxed when off  30m walk time now 11.7 seconds   Attempted sit to stand with [place] stim applied x 2, very mild reduction in pain reported. Allowed "warm up" time of 2 minutes prior to attempting.                         PT  Education - 07/20/16 1616    Education provided Yes   Education Details Continue with HEP, will progress ROM as tolerated.    Person(s) Educated Patient   Methods Explanation   Comprehension Verbalized understanding             PT Long Term Goals - 07/20/16 1305      PT LONG TERM GOAL #1   Title Patient will report a modified ODI score of less than 30% disability to demonstrate improved tolerance for ADLs.    Baseline 60%   Time 8   Period Weeks   Status New     PT LONG TERM GOAL #2   Title Patient will complete 14m walk time in less than 10 seconds to return to community mobility.    Baseline >15 seconds    Time 8   Period Weeks   Status New     PT LONG TERM GOAL #3   Title Patient will be able to lift 50# off the floor with an increase in pain no more than 1/10 from baseline to return to work related activities.    Baseline Unable    Time 8   Period Weeks   Status New     PT LONG TERM GOAL #4   Title Patient will be able to stand for at least 1 hour with minimal increase in pain to complete work related activities.    Time 8   Period Weeks   Status New               Plan - 07/20/16 1617    Clinical Impression Statement Patient demonstrates significant improvement in 67m walk time today (15.7 seconds to 11.7 seconds) after TENS with clamshells. She continues to have significant trigger points and pain in her R posterior hip, though she reports improved symptoms with gait after soft tissue mobilization and TENS.    Rehab Potential Good   PT Frequency 2x / week   PT Duration 8 weeks   PT Treatment/Interventions Aquatic Therapy;Cryotherapy;Lobbyist;Therapeutic exercise;Therapeutic activities;Patient/family education;Manual techniques;Taping;Dry needling;Gait training;Stair training;Iontophoresis 4mg /ml Dexamethasone;Moist Heat   PT Next Visit Plan Progress isometric gluteal work, avoid piriformis stretching, soft tissue  mobilization over any tender areas.    PT Home Exercise Plan Tennis ball massage over tender area, sidelying clamshells with belt to create isometric work.    Consulted and Agree with Plan of Care Patient      Patient will benefit from skilled therapeutic intervention in order to improve the following deficits and impairments:  Abnormal gait, Pain, Decreased mobility, Increased  muscle spasms, Impaired flexibility, Difficulty walking, Decreased balance, Decreased activity tolerance, Decreased endurance, Decreased strength  Visit Diagnosis: Midline low back pain without sciatica  Difficulty in walking, not elsewhere classified     Problem List Patient Active Problem List   Diagnosis Date Noted  . Back pain, chronic 07/06/2016  . Adiposity 07/06/2016  . Skin lesion 06/19/2016  . IBS (irritable bowel syndrome) 12/21/2015  . Environmental allergies 04/21/2015  . Arthritis 04/21/2015  . GERD (gastroesophageal reflux disease) 04/21/2015  . Type 2 diabetes mellitus ([place]) 01/20/2015   Kerman Passey, PT, DPT     07/20/2016, 4:19 PM   [facility] [address], [place], [address] Phone: [phone]   Fax:  [phone]  Name: Brittany Santos [id number] MRN: [id number] Date of Birth: May 24, 1961

## 2016-07-20 NOTE — Therapy (Signed)
[place] [facility] [address], [place], [address] Phone: [phone]   Fax:  [phone]  Physical Therapy Evaluation  Patient Details  Name: Brittany Santos [id number] MRN: [id number] Date of Birth: August 25, 1961 No Data Recorded  Encounter Date: 07/18/2016      PT End of Session - 07/20/16 0944    Visit Number 1   Number of Visits 17   Date for PT Re-Evaluation 09/21/16   PT Start Time [id number]   PT Stop Time 1810   PT Time Calculation (min) 55 min   Activity Tolerance Patient tolerated treatment well   Behavior During Therapy [facility] for tasks assessed/performed      Past Medical History:  Diagnosis Date  . Allergy   . Arthritis   . Diabetes mellitus without complication ([place])   . GERD (gastroesophageal reflux disease)   . History of colon polyps   . History of diverticulitis   . History of kidney stones 04/15/15    Past Surgical History:  Procedure Laterality Date  . BILATERAL CARPAL TUNNEL RELEASE    . BREAST SURGERY    . cervical neck fusion    . CHOLECYSTECTOMY    . ESOPHAGOGASTRODUODENOSCOPY (EGD) WITH PROPOFOL N/A 05/12/2016   Procedure: ESOPHAGOGASTRODUODENOSCOPY (EGD) WITH PROPOFOL;  Surgeon: Lollie Sails, MD;  Location: [facility] ENDOSCOPY;  Service: Endoscopy;  Laterality: N/A;    There were no vitals filed for this visit.       Subjective Assessment - 07/20/16 0951    Subjective Patient reports she was working in her backyard and fell sustaining a coccyx and L2 compression fx on April 30th of this year. Since that time she has had follow up imaging indicating her fx is healing nicely and has opted to delay a kyphoplasty until after attempting therapy. She works as a Naval architect and has been unable to complete work related duties as a result of this injury, she has recently started desk work which is quite painful as she is unable to sit for long periods of time without pain. She reports R big toe  tingling intermittently, this was present before this injury. For her job she is required to lift 50#. She gets a catch if she walks too much, lasts until she lies on her L side.    Limitations Sitting;Lifting;Standing;Walking;House hold activities   Diagnostic tests X-ray/MRI indicating L2 compression fracture, though "healing nicely" per last scan.    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 Yes   Pain Score 4    Pain Location Back   Pain Orientation Right;Lower   Pain Descriptors / Indicators Aching   Pain Type Chronic pain   Pain Onset More than a month ago   Pain Frequency Constant   Aggravating Factors  Lying supine, walking, sitting for proloned time.    Pain Relieving Factors Lying on L side.    Multiple Pain Sites No            [facility] PT Assessment - 07/20/16 0957      Assessment   Medical Diagnosis --  L2 compression fx   Onset Date/Surgical Date 04/16/16     Precautions   Precautions --  Spinal - NO MANUAL     Restrictions   Weight Bearing Restrictions No     Balance Screen   Has the patient fallen in the past 6 months Yes   How many times? 1  Has the patient had a decrease in activity level because of a fear of falling?  Yes     Prior Function   Level of Independence Independent   Vocation Full time employment   Vocation Requirements --  Inspecting cars, lifting equipment   Leisure --  Goes to concerts     Cognition   Overall Cognitive Status Within Functional Limits for tasks assessed     Observation/Other Assessments   Modified [place] 60     Sensation   Light Touch Appears Intact     Ambulation/Gait   Assistive device Straight cane     Standardized Balance Assessment   10 Meter Walk 15.73       Multiple bouts of soft tissue mobilization provided to R gluteal and flank region with significant reduction of pain, patient reports roughly 60% decline from baseline in this session.   L  sidelying isometric clamshells with belt around knees 2 bouts x 10 repetitions with 3" holds   Educated patient on use of a tennis ball against wall to provide soft tissue mobilization to her posterior hip in painful area.                     PT Education - 07/20/16 0950    Education provided Yes   Education Details Progress through therapy at a reasonable pace, isometrics and soft tissue mobilization initially to address pain. Provided TNE in context to her symptoms.    Person(s) Educated Patient   Methods Explanation;Demonstration;Handout   Comprehension Verbalized understanding;Returned demonstration             PT Long Term Goals - 07/20/16 1305      PT LONG TERM GOAL #1   Title Patient will report a modified ODI score of less than 30% disability to demonstrate improved tolerance for ADLs.    Baseline 60%   Time 8   Period Weeks   Status New     PT LONG TERM GOAL #2   Title Patient will complete 59m walk time in less than 10 seconds to return to community mobility.    Baseline >15 seconds    Time 8   Period Weeks   Status New     PT LONG TERM GOAL #3   Title Patient will be able to lift 50# off the floor with an increase in pain no more than 1/10 from baseline to return to work related activities.    Baseline Unable    Time 8   Period Weeks   Status New     PT LONG TERM GOAL #4   Title Patient will be able to stand for at least 1 hour with minimal increase in pain to complete work related activities.    Time 8   Period Weeks   Status New               Plan - 07/20/16 0945    Clinical Impression Statement Patient suffered a fall at the end of April and sustained a coccyx fracture and L2 compression fx. Thus far she has opted for a conservative approach, though she is quite limited by pain. She is unable to lie supine or tolerate standing for any length of time,thus this evaluation is limited. She is reporting localized pain more over R  posterior hip and flank currently, which can be quite irritable. She responded well to soft tissue mobilization and postero-lateral hip isometrics reporting 40-60% improvement in symptoms in this session. She would benefit  from skilled PT services to increase her tolerance for ADLs.    Rehab Potential Good   PT Frequency 2x / week   PT Duration 8 weeks   PT Treatment/Interventions Aquatic Therapy;Cryotherapy;Lobbyist;Therapeutic exercise;Therapeutic activities;Patient/family education;Manual techniques;Taping;Dry needling;Gait training;Stair training;Iontophoresis 4mg /ml Dexamethasone;Moist Heat   PT Next Visit Plan Progress isometric gluteal work, avoid piriformis stretching, soft tissue mobilization over any tender areas.    PT Home Exercise Plan Tennis ball massage over tender area, sidelying clamshells with belt to create isometric work.    Consulted and Agree with Plan of Care Patient      Patient will benefit from skilled therapeutic intervention in order to improve the following deficits and impairments:  Abnormal gait, Pain, Decreased mobility, Increased muscle spasms, Impaired flexibility, Difficulty walking, Decreased balance, Decreased activity tolerance, Decreased endurance, Decreased strength  Visit Diagnosis: Midline low back pain without sciatica - Plan: PT plan of care cert/re-cert  Difficulty in walking, not elsewhere classified - Plan: PT plan of care cert/re-cert     Problem List Patient Active Problem List   Diagnosis Date Noted  . Back pain, chronic 07/06/2016  . Adiposity 07/06/2016  . Skin lesion 06/19/2016  . IBS (irritable bowel syndrome) 12/21/2015  . Environmental allergies 04/21/2015  . Arthritis 04/21/2015  . GERD (gastroesophageal reflux disease) 04/21/2015  . Type 2 diabetes mellitus ([place]) 01/20/2015   Kerman Passey, PT, DPT    07/20/2016, 1:37 PM  [place] [facility] [address], [place], [address] Phone: [phone]   Fax:  [phone]  Name: Brittany Santos [id number] MRN: [id number] Date of Birth: 11-05-1961

## 2016-07-24 ENCOUNTER — Encounter: Payer: Self-pay | Admitting: Physical Therapy

## 2016-07-24 ENCOUNTER — Ambulatory Visit: Payer: BLUE CROSS/BLUE SHIELD

## 2016-07-24 DIAGNOSIS — M545 Low back pain, unspecified: Secondary | ICD-10-CM

## 2016-07-24 DIAGNOSIS — R262 Difficulty in walking, not elsewhere classified: Secondary | ICD-10-CM

## 2016-07-24 NOTE — Therapy (Signed)
[place] [facility] [address] [place], [address] Phone: [phone]   Fax:  [phone]  Physical Therapy Treatment  Patient Details  Name: Brittany Santos [id number] MRN: [id number] Date of Birth: 1961/09/14 No Data Recorded  Encounter Date: 07/24/2016      PT End of Session - 07/25/16 0824    Visit Number 3   Number of Visits 17   Date for PT Re-Evaluation 09/21/16   PT Start Time 1620   PT Stop Time 1705   PT Time Calculation (min) 45 min   Activity Tolerance Patient limited by pain   Behavior During Therapy [facility] for tasks assessed/performed      Past Medical History:  Diagnosis Date  . Allergy   . Arthritis   . Diabetes mellitus without complication ([place])   . GERD (gastroesophageal reflux disease)   . History of colon polyps   . History of diverticulitis   . History of kidney stones 04/15/15    Past Surgical History:  Procedure Laterality Date  . BILATERAL CARPAL TUNNEL RELEASE    . BREAST SURGERY    . cervical neck fusion    . CHOLECYSTECTOMY    . ESOPHAGOGASTRODUODENOSCOPY (EGD) WITH PROPOFOL N/A 05/12/2016   Procedure: ESOPHAGOGASTRODUODENOSCOPY (EGD) WITH PROPOFOL;  Surgeon: Lollie Sails, MD;  Location: [facility] ENDOSCOPY;  Service: Endoscopy;  Laterality: N/A;    There were no vitals filed for this visit.      Subjective Assessment - 07/24/16 1634    Subjective Pt reports that she is having some low back pain today. She reports low back pain as 7/10 currently. She is performing HEP. No specific questions or concerns currently.    Limitations Sitting;Lifting;Standing;Walking;House hold activities   Diagnostic tests X-ray/MRI indicating L2 compression fracture, though "healing nicely" per last scan.    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 Yes   Pain Score 7    Pain Location Back   Pain Orientation Right;Left;Lower   Pain Descriptors /  Indicators Aching   Pain Type Chronic pain   Pain Onset More than a month ago   Pain Frequency Constant         TREATMENT  THER-EX Sidelying isometric clamshells and isometric hip abduction 3-5 second hold, 2 x 10 each with IFC to low back with large pads to patient tolerance (11V);   Sit to stand 2 x 5 with cues for proper form and technique to hinge from hips and avoid lumbar flexion;  Resisted side stepping with green t-band 6' x 6;  Physioball seated marches x 10 bilateral, increase in low back pain so discontinued;  Seated marches x 10 bilateral with cues to avoid lumbar compensation x 10;  Extensive education about sitting posture and importance of maintaining natural lumbar lordosis to avoid excessive lumbar flexion;  Cold pack applied to lumbar spine x 5 minutes (unbilled);  Exercises performed slow and controlled with rest breaks to minimize increase in back pain.                       PT Education - 07/24/16 1636    Education provided Yes   Education Details HEP reinforced   Person(s) Educated Patient   Methods Explanation   Comprehension Verbalized understanding             PT Long Term Goals - 07/20/16 1305      PT LONG  TERM GOAL #1   Title Patient will report a modified ODI score of less than 30% disability to demonstrate improved tolerance for ADLs.    Baseline 60%   Time 8   Period Weeks   Status New     PT LONG TERM GOAL #2   Title Patient will complete 17m walk time in less than 10 seconds to return to community mobility.    Baseline >15 seconds    Time 8   Period Weeks   Status New     PT LONG TERM GOAL #3   Title Patient will be able to lift 50# off the floor with an increase in pain no more than 1/10 from baseline to return to work related activities.    Baseline Unable    Time 8   Period Weeks   Status New     PT LONG TERM GOAL #4   Title Patient will be able to stand for at least 1 hour with minimal increase  in pain to complete work related activities.    Time 8   Period Weeks   Status New               Plan - 07/25/16 0825    Clinical Impression Statement Pt reports decrease in pain with IFC to lumbar spine during sidelying exercises. She requires considerable cues for modification of exercise technique to avoid low back pain. Pt instructed in proper sitting posture and lumbar alignment with sit to stand to avoid low back pain. Pt encouraged to continue HEP and follow-up as scheduled.    Rehab Potential Good   PT Frequency 2x / week   PT Duration 8 weeks   PT Treatment/Interventions Aquatic Therapy;Cryotherapy;Lobbyist;Therapeutic exercise;Therapeutic activities;Patient/family education;Manual techniques;Taping;Dry needling;Gait training;Stair training;Iontophoresis 4mg /ml Dexamethasone;Moist Heat   PT Next Visit Plan Progress isometric gluteal work, avoid piriformis stretching, soft tissue mobilization over any tender areas, utilize IFC as indicated    PT Home Exercise Plan Tennis ball massage over tender area, sidelying clamshells with belt to create isometric work.    Consulted and Agree with Plan of Care Patient      Patient will benefit from skilled therapeutic intervention in order to improve the following deficits and impairments:  Abnormal gait, Pain, Decreased mobility, Increased muscle spasms, Impaired flexibility, Difficulty walking, Decreased balance, Decreased activity tolerance, Decreased endurance, Decreased strength  Visit Diagnosis: Midline low back pain without sciatica  Difficulty in walking, not elsewhere classified     Problem List Patient Active Problem List   Diagnosis Date Noted  . Back pain, chronic 07/06/2016  . Adiposity 07/06/2016  . Skin lesion 06/19/2016  . IBS (irritable bowel syndrome) 12/21/2015  . Environmental allergies 04/21/2015  . Arthritis 04/21/2015  . GERD (gastroesophageal reflux disease) 04/21/2015  .  Type 2 diabetes mellitus ([place]) 01/20/2015   Phillips Grout PT, DPT   Huprich,Jason 07/25/2016, 8:38 AM  [place] [facility] [address] [place], [address] Phone: [phone]   Fax:  [phone]  Name: Brittany Santos [id number] MRN: [id number] Date of Birth: [birth year]

## 2016-07-27 ENCOUNTER — Ambulatory Visit: Payer: BLUE CROSS/BLUE SHIELD | Admitting: Physical Therapy

## 2016-07-27 ENCOUNTER — Encounter: Payer: BLUE CROSS/BLUE SHIELD | Admitting: Physical Therapy

## 2016-07-27 DIAGNOSIS — M545 Low back pain, unspecified: Secondary | ICD-10-CM

## 2016-07-27 DIAGNOSIS — R262 Difficulty in walking, not elsewhere classified: Secondary | ICD-10-CM

## 2016-07-27 NOTE — Therapy (Signed)
[place] MAIN [facility] SERVICES [address] [place], [place], [address] Phone: [phone]   Fax:  [phone]  Physical Therapy Treatment  Patient Details  Name: Brittany Santos [id number] MRN: [id number] Date of Birth: Dec 21, 1960 No Data Recorded  Encounter Date: 07/27/2016      PT End of Session - 07/27/16 1025    Visit Number 4   Number of Visits 17   Date for PT Re-Evaluation 09/21/16   PT Start Time [id number]   PT Stop Time 1100   PT Time Calculation (min) 45 min   Activity Tolerance Patient tolerated treatment well;No increased pain   Behavior During Therapy [facility] for tasks assessed/performed      Past Medical History:  Diagnosis Date  . Allergy   . Arthritis   . Diabetes mellitus without complication ([place])   . GERD (gastroesophageal reflux disease)   . History of colon polyps   . History of diverticulitis   . History of kidney stones 04/15/15    Past Surgical History:  Procedure Laterality Date  . BILATERAL CARPAL TUNNEL RELEASE    . BREAST SURGERY    . cervical neck fusion    . CHOLECYSTECTOMY    . ESOPHAGOGASTRODUODENOSCOPY (EGD) WITH PROPOFOL N/A 05/12/2016   Procedure: ESOPHAGOGASTRODUODENOSCOPY (EGD) WITH PROPOFOL;  Surgeon: Lollie Sails, MD;  Location: [facility] ENDOSCOPY;  Service: Endoscopy;  Laterality: N/A;    There were no vitals filed for this visit.      Subjective Assessment - 07/27/16 1019    Subjective Pt reports her pain is 3/10. Pt feels increased pain after performing her HEP but then pain decreases the following day after she gets some rest.  Pt works from home and she has been sent an Orthoptist. Pt takes a break 30 min and it is hard to stand up initially. Pt finds herself slouching.    Limitations Sitting;Lifting;Standing;Walking;House hold activities   Diagnostic tests X-ray/MRI indicating L2 compression fracture, though "healing nicely" per last scan.    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  50 ft =1 lap  Exercises performed in 3'6" depth   Stretches: Heel raises 10x Mini squats with cues for alignment to minimzie knee strain 10 x Hip circles hip abd Shoulder roll back wards Neck ROM   (Educated pt to perform these stretches on her work breaks)   2 laps walking forward with two noodles, feet along side of blue line for propioception (cued for thigh high)  4 laps walking backwards, noodle behind armpits, elbows extended  (cues for slower speed, eccentric control of heels)  2 laps sidestepping L/R  with dumbbells, shoulder abd   Stretch: Calf stretch alignment cues   Relaxation 5' floating on noodles , pulled by PT 2 laps   Educated pt on work Scientist, water quality and proper sitting posture without leaning against the back of her chair. Pt demo'd correctly and voiced understanding.                 PT Long Term Goals - 07/20/16 1305  PT LONG TERM GOAL #1   Title Patient will report a modified ODI score of less than 30% disability to demonstrate improved tolerance for ADLs.    Baseline 60%   Time 8   Period Weeks   Status New     PT LONG TERM GOAL #2   Title Patient will complete 17m walk time in less than 10 seconds to return to community mobility.    Baseline >15 seconds    Time 8   Period Weeks   Status New     PT LONG TERM GOAL #3   Title Patient will be able to lift 50# off the floor with an increase in pain no more than 1/10 from baseline to return to work related activities.    Baseline Unable    Time 8   Period Weeks   Status New     PT LONG TERM GOAL #4   Title Patient will be able to stand for at least 1 hour with minimal increase in pain to complete  work related activities.    Time 8   Period Weeks   Status New               Plan - 07/27/16 1106    Clinical Impression Statement Pt reported feeling no increase in LBP and it feels "a little looser than it did" at the end of the session. Pt tolerated aquatic session and demo'd good carry with body mechanics with bending, eccentric control of plantar fascia with descent of steps and back walking, and stretches for flexibility along the spine. Pt was educated on work Art therapist, and sitting posture and pt demo'd correctly.  Pt will continue to benefit from skilled PT.    Rehab Potential Good   PT Frequency 2x / week   PT Duration 8 weeks   PT Treatment/Interventions Aquatic Therapy;Cryotherapy;Lobbyist;Therapeutic exercise;Therapeutic activities;Patient/family education;Manual techniques;Taping;Dry needling;Gait training;Stair training;Iontophoresis 4mg /ml Dexamethasone;Moist Heat   PT Next Visit Plan Progress isometric gluteal work, avoid piriformis stretching, soft tissue mobilization over any tender areas, utilize IFC as indicated    PT Home Exercise Plan Tennis ball massage over tender area, sidelying clamshells with belt to create isometric work.    Consulted and Agree with Plan of Care Patient      Patient will benefit from skilled therapeutic intervention in order to improve the following deficits and impairments:  Abnormal gait, Pain, Decreased mobility, Increased muscle spasms, Impaired flexibility, Difficulty walking, Decreased balance, Decreased activity tolerance, Decreased endurance, Decreased strength  Visit Diagnosis: Midline low back pain without sciatica  Difficulty in walking, not elsewhere classified     Problem List Patient Active Problem List   Diagnosis Date Noted  . Back pain, chronic 07/06/2016  . Adiposity 07/06/2016  . Skin lesion 06/19/2016  . IBS (irritable bowel syndrome)  12/21/2015  . Environmental allergies 04/21/2015  . Arthritis 04/21/2015  . GERD (gastroesophageal reflux disease) 04/21/2015  . Type 2 diabetes mellitus ([place]) 01/20/2015    [name] ,PT, DPT, E-RYT  07/27/2016, 11:08 AM  [place] MAIN [facility] SERVICES [address] [place], [place], [address] Phone: [phone]   Fax:  [phone]  Name: Cresha Leider [id number] MRN: [id number] Date of Birth: [birth year]

## 2016-07-31 ENCOUNTER — Ambulatory Visit: Payer: BLUE CROSS/BLUE SHIELD | Admitting: Physical Therapy

## 2016-08-03 ENCOUNTER — Encounter: Payer: BLUE CROSS/BLUE SHIELD | Admitting: Physical Therapy

## 2016-08-03 ENCOUNTER — Ambulatory Visit: Payer: BLUE CROSS/BLUE SHIELD | Admitting: Physical Therapy

## 2016-08-03 DIAGNOSIS — M545 Low back pain, unspecified: Secondary | ICD-10-CM

## 2016-08-03 DIAGNOSIS — R262 Difficulty in walking, not elsewhere classified: Secondary | ICD-10-CM

## 2016-08-03 NOTE — Therapy (Signed)
[place] MAIN [facility] SERVICES [address] [place], [place], [address] Phone: [phone]   Fax:  [phone]  Physical Therapy Treatment  Patient Details  Name: Brittany Santos [id number] MRN: [id number] Date of Birth: 1961/02/01 No Data Recorded  Encounter Date: 08/03/2016      PT End of Session - 08/03/16 0925    Visit Number 5   Number of Visits 17   Date for PT Re-Evaluation 09/21/16   PT Start Time 0845   PT Stop Time 0930   PT Time Calculation (min) 45 min   Activity Tolerance Patient tolerated treatment well;No increased pain   Behavior During Therapy WFL for tasks assessed/performed      Past Medical History:  Diagnosis Date  . Allergy   . Arthritis   . Diabetes mellitus without complication ([place])   . GERD (gastroesophageal reflux disease)   . History of colon polyps   . History of diverticulitis   . History of kidney stones 04/15/15    Past Surgical History:  Procedure Laterality Date  . BILATERAL CARPAL TUNNEL RELEASE    . BREAST SURGERY    . cervical neck fusion    . CHOLECYSTECTOMY    . ESOPHAGOGASTRODUODENOSCOPY (EGD) WITH PROPOFOL N/A 05/12/2016   Procedure: ESOPHAGOGASTRODUODENOSCOPY (EGD) WITH PROPOFOL;  Surgeon: Lollie Sails, MD;  Location: [facility] ENDOSCOPY;  Service: Endoscopy;  Laterality: N/A;    There were no vitals filed for this visit.      Subjective Assessment - 08/03/16 0924    Subjective Pt has been practicing side stepping exercises and made adjustments to her work station. Pt reported feeling good after last session.    Limitations Sitting;Lifting;Standing;Walking;House hold activities   Diagnostic tests X-ray/MRI indicating L2 compression fracture, though "healing nicely" per last scan.    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  Cued pt to walk 45 deg facing the rail for more stability. Pt demo'd correctly  50 ft =1 lap  Exercises performed in 3'6" depth   Stretches: Hip abd circles B, cued for finger tip support on rail, pt showed stability Neck ROM,  Spinal, trunk ROM  10 reps mini squats   4 laps  forward walking without noodles ( discontinued noodles in hand due to over use of upper trap)  4 laps  Side stepping L/R  2 laps side stepping with mini squat with shoulder extension  Stretches:  Spinal, trunk ROM spinal traction by rail  Standing piriformis stretch    5' floating on noodles                  PT Long Term Goals - 07/20/16 1305      PT LONG TERM GOAL #1   Title Patient will report a modified ODI score of less than 30% disability to demonstrate improved tolerance for ADLs.    Baseline 60%   Time 8   Period Weeks   Status New     PT LONG TERM GOAL #2   Title Patient will complete 84m walk time in less  than 10 seconds to return to community mobility.    Baseline >15 seconds    Time 8   Period Weeks   Status New     PT LONG TERM GOAL #3   Title Patient will be able to lift 50# off the floor with an increase in pain no more than 1/10 from baseline to return to work related activities.    Baseline Unable    Time 8   Period Weeks   Status New     PT LONG TERM GOAL #4   Title Patient will be able to stand for at least 1 hour with minimal increase in pain to complete work related activities.    Time 8   Period Weeks   Status New               Plan - 08/03/16 [id number]    Clinical Impression Statement Pt demo'd good carry over with stretches. Pt advanced with shoulder extension during mini squats to strengthen thoracolumbar system. Pt reported she felt minor soreness in her lower back upon arrival and not increased pain at the end of the  session.  Introduced priformis stretch for gluts and posterior back and discussed using  pelvic tilts (learned at last session) to relieve L5/ S area . Pt has been compliant with work station modifications. Pt will continue to benefit from skilled PT   Rehab Potential Good   PT Frequency 2x / week   PT Duration 8 weeks   PT Treatment/Interventions Aquatic Therapy;Cryotherapy;Lobbyist;Therapeutic exercise;Therapeutic activities;Patient/family education;Manual techniques;Taping;Dry needling;Gait training;Stair training;Iontophoresis 4mg /ml Dexamethasone;Moist Heat   PT Next Visit Plan Progress isometric gluteal work, avoid piriformis stretching, soft tissue mobilization over any tender areas, utilize IFC as indicated    PT Home Exercise Plan Tennis ball massage over tender area, sidelying clamshells with belt to create isometric work.    Consulted and Agree with Plan of Care Patient      Patient will benefit from skilled therapeutic intervention in order to improve the following deficits and impairments:  Abnormal gait, Pain, Decreased mobility, Increased muscle spasms, Impaired flexibility, Difficulty walking, Decreased balance, Decreased activity tolerance, Decreased endurance, Decreased strength  Visit Diagnosis: Midline low back pain without sciatica  Difficulty in walking, not elsewhere classified     Problem List Patient Active Problem List   Diagnosis Date Noted  . Back pain, chronic 07/06/2016  . Adiposity 07/06/2016  . Skin lesion 06/19/2016  . IBS (irritable bowel syndrome) 12/21/2015  . Environmental allergies 04/21/2015  . Arthritis 04/21/2015  . GERD (gastroesophageal reflux disease) 04/21/2015  . Type 2 diabetes mellitus ([place]) 01/20/2015    Brittany Santos ,PT, DPT, E-RYT  08/03/2016, 9:30 AM  [place] MAIN [facility] SERVICES [address] [place], [place], [address] Phone: [phone]   Fax:   [phone]  Name: Brittany Santos [id number] MRN: [id number] Date of Birth: 1961/11/03

## 2016-08-08 ENCOUNTER — Ambulatory Visit: Payer: BLUE CROSS/BLUE SHIELD | Admitting: Physical Therapy

## 2016-08-08 DIAGNOSIS — R262 Difficulty in walking, not elsewhere classified: Secondary | ICD-10-CM

## 2016-08-08 DIAGNOSIS — M545 Low back pain, unspecified: Secondary | ICD-10-CM

## 2016-08-09 NOTE — Patient Instructions (Signed)
Sidelying clamshells 3 sets x 8 repetitions against PT resistance   108m walk in 11.3 seconds

## 2016-08-09 NOTE — Therapy (Signed)
[place] [facility] [address], [place], [address] Phone: [phone]   Fax:  [phone]  Physical Therapy Treatment  Patient Details  Name: Brittany Santos [id number] MRN: [id number] Date of Birth: [birth year] No Data Recorded  Encounter Date: 08/08/2016      PT End of Session - 08/09/16 1549    Visit Number 6   Number of Visits 17   Date for PT Re-Evaluation 09/21/16   PT Start Time [id number]   PT Stop Time [id number]   PT Time Calculation (min) 27 min   Activity Tolerance Patient tolerated treatment well;No increased pain   Behavior During Therapy WFL for tasks assessed/performed      Past Medical History:  Diagnosis Date  . Allergy   . Arthritis   . Diabetes mellitus without complication ([place])   . GERD (gastroesophageal reflux disease)   . History of colon polyps   . History of diverticulitis   . History of kidney stones 04/15/15    Past Surgical History:  Procedure Laterality Date  . BILATERAL CARPAL TUNNEL RELEASE    . BREAST SURGERY    . cervical neck fusion    . CHOLECYSTECTOMY    . ESOPHAGOGASTRODUODENOSCOPY (EGD) WITH PROPOFOL N/A 05/12/2016   Procedure: ESOPHAGOGASTRODUODENOSCOPY (EGD) WITH PROPOFOL;  Surgeon: Lollie Sails, MD;  Location: [facility] ENDOSCOPY;  Service: Endoscopy;  Laterality: N/A;    There were no vitals filed for this visit.      Subjective Assessment - 08/09/16 1555    Subjective Pt reports pain as mild to moderate today and recently, significantly improved from baseline. She reports she is able to do more, though she continues to have bouts of pain at work even though she is sitting.    Limitations Sitting;Lifting;Standing;Walking;House hold activities   Diagnostic tests X-ray/MRI indicating L2 compression fracture, though "healing nicely" per last scan.    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 --  Mild to moderate pain in back with  prolonged sitting.    Pain Location Back   Pain Orientation Right;Left;Lower   Pain Descriptors / Indicators Aching   Pain Type Chronic pain   Pain Onset More than a month ago   Pain Frequency Constant   Aggravating Factors  Transitioning from sitting to standing.    Pain Relieving Factors Lying on L side, short walking.       Soft tissue  Mobilization to gluteal musculature which was initially quite tender on L side, reduced with continued exposure. Patient reported less pain with ambulation.     Sidelying clamshells 3 sets x 8 repetitions against PT resistance for isometrics with break between each bout.    61m walk in 11.3 seconds                         PT Education - 08/09/16 1555    Education provided Yes   Education Details Progressed HEP   Person(s) Educated Patient   Methods Explanation;Demonstration;Handout   Comprehension Verbalized understanding;Returned demonstration             PT Long Term Goals - 07/20/16 1305      PT LONG TERM GOAL #1   Title Patient will report a modified ODI score of less than 30% disability to demonstrate improved tolerance for ADLs.    Baseline 60%   Time 8   Period Weeks   Status New  PT LONG TERM GOAL #2   Title Patient will complete 64m walk time in less than 10 seconds to return to community mobility.    Baseline >15 seconds    Time 8   Period Weeks   Status New     PT LONG TERM GOAL #3   Title Patient will be able to lift 50# off the floor with an increase in pain no more than 1/10 from baseline to return to work related activities.    Baseline Unable    Time 8   Period Weeks   Status New     PT LONG TERM GOAL #4   Title Patient will be able to stand for at least 1 hour with minimal increase in pain to complete work related activities.    Time 8   Period Weeks   Status New               Plan - 08/09/16 1550    Clinical Impression Statement Patient reports decreased pain from  baseline, though she continues to have pain at work with sitting, prolonged standing/walking. She demonstrates retention of gait speed from previous land session, of ~ 11 seconds for 18m walk. She would benefit from additional pain control measures prior to strengthening and movement retraining phase of rehab.    Rehab Potential Good   PT Frequency 2x / week   PT Duration 8 weeks   PT Treatment/Interventions Aquatic Therapy;Cryotherapy;Lobbyist;Therapeutic exercise;Therapeutic activities;Patient/family education;Manual techniques;Taping;Dry needling;Gait training;Stair training;Iontophoresis 4mg /ml Dexamethasone;Moist Heat   PT Next Visit Plan Progress isometric gluteal work, avoid piriformis stretching, soft tissue mobilization over any tender areas, utilize IFC as indicated    PT Home Exercise Plan Tennis ball massage over tender area, sidelying clamshells with belt to create isometric work.    Consulted and Agree with Plan of Care Patient      Patient will benefit from skilled therapeutic intervention in order to improve the following deficits and impairments:  Abnormal gait, Pain, Decreased mobility, Increased muscle spasms, Impaired flexibility, Difficulty walking, Decreased balance, Decreased activity tolerance, Decreased endurance, Decreased strength  Visit Diagnosis: Midline low back pain without sciatica  Difficulty in walking, not elsewhere classified     Problem List Patient Active Problem List   Diagnosis Date Noted  . Back pain, chronic 07/06/2016  . Adiposity 07/06/2016  . Skin lesion 06/19/2016  . IBS (irritable bowel syndrome) 12/21/2015  . Environmental allergies 04/21/2015  . Arthritis 04/21/2015  . GERD (gastroesophageal reflux disease) 04/21/2015  . Type 2 diabetes mellitus ([place]) 01/20/2015    Kerman Passey, PT, DPT    08/09/2016, 3:58 PM  [place] [facility] [address] [place], [address] Phone: [phone]   Fax:  [phone]  Name: Brittany Santos [id number] MRN: [id number] Date of Birth: Feb 09, 1961

## 2016-08-10 ENCOUNTER — Ambulatory Visit: Payer: BLUE CROSS/BLUE SHIELD | Admitting: Physical Therapy

## 2016-08-10 ENCOUNTER — Encounter: Payer: Self-pay | Admitting: Physical Therapy

## 2016-08-10 DIAGNOSIS — M545 Low back pain, unspecified: Secondary | ICD-10-CM

## 2016-08-10 DIAGNOSIS — R262 Difficulty in walking, not elsewhere classified: Secondary | ICD-10-CM

## 2016-08-10 NOTE — Therapy (Signed)
[place] [facility] [address] [place], [address] Phone: [phone]   Fax:  [phone]  Physical Therapy Treatment  Patient Details  Name: Brittany Santos [id number] MRN: [id number] Date of Birth: [birth year] No Data Recorded  Encounter Date: 08/10/2016      PT End of Session - 08/10/16 1707    Visit Number 7   Number of Visits 17   Date for PT Re-Evaluation 09/21/16   PT Start Time [id number]   PT Stop Time 1700   PT Time Calculation (min) 45 min   Activity Tolerance Patient tolerated treatment well;No increased pain   Behavior During Therapy WFL for tasks assessed/performed      Past Medical History:  Diagnosis Date  . Allergy   . Arthritis   . Diabetes mellitus without complication ([place])   . GERD (gastroesophageal reflux disease)   . History of colon polyps   . History of diverticulitis   . History of kidney stones 04/15/15    Past Surgical History:  Procedure Laterality Date  . BILATERAL CARPAL TUNNEL RELEASE    . BREAST SURGERY    . cervical neck fusion    . CHOLECYSTECTOMY    . ESOPHAGOGASTRODUODENOSCOPY (EGD) WITH PROPOFOL N/A 05/12/2016   Procedure: ESOPHAGOGASTRODUODENOSCOPY (EGD) WITH PROPOFOL;  Surgeon: Lollie Sails, MD;  Location: [facility] ENDOSCOPY;  Service: Endoscopy;  Laterality: N/A;    There were no vitals filed for this visit.      Subjective Assessment - 08/10/16 1703    Subjective Pt reports mild pain today due to increased sitting at work.    Limitations Sitting;Lifting;Standing;Walking;House hold activities   Diagnostic tests X-ray/MRI indicating L2 compression fracture, though "healing nicely" per last scan.    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 Yes   Pain Score 5    Pain Location Back   Pain Orientation Right   Pain Descriptors / Indicators Sore   Pain Onset More than a month ago      Manual: Cross friction and  efflurage to R glute max, med, and pirformis with pt in standing, leaning on table x 8 min; then performed efflurage to same areas at end of session x 4 min; pt reported decreased pain following each bout  Therex: 10MWT: 10.78 sec; improvement from previous session  Step ups on 5" step with RLE only, 2x10; pt demonstrated fatigue following exercise  bil side stepping with RTB, 79ft x 3 laps; pt demonstrated fatigue and required min cues to decrease RLE foot drag  bil SLS on airex with pallof press with RTB, 1x10 each; SBA for steadiness, increased difficulty when on RLE  bil SLS on airex with chops with GTB, 1x10 with elbows bent and 1x10 with elbows straight; slight increased difficulty with elbows straight  Modified birddog (LE only), 1x10; min tactile cues for decreased pelvic rotation       PT Education - 08/10/16 1707    Education provided Yes   Education Details exercise technique, HEP   Person(s) Educated Patient   Methods Explanation   Comprehension Verbalized understanding             PT Long Term Goals - 07/20/16 1305      PT LONG TERM GOAL #1   Title Patient will report a modified ODI score of less than 30% disability to demonstrate improved tolerance for ADLs.    Baseline 60%   Time 8  Period Weeks   Status New     PT LONG TERM GOAL #2   Title Patient will complete 34m walk time in less than 10 seconds to return to community mobility.    Baseline >15 seconds    Time 8   Period Weeks   Status New     PT LONG TERM GOAL #3   Title Patient will be able to lift 50# off the floor with an increase in pain no more than 1/10 from baseline to return to work related activities.    Baseline Unable    Time 8   Period Weeks   Status New     PT LONG TERM GOAL #4   Title Patient will be able to stand for at least 1 hour with minimal increase in pain to complete work related activities.    Time 8   Period Weeks   Status New               Plan -  08/10/16 1708    Clinical Impression Statement Pt presents to therapy today with continuing decreased pain in low back and R hip. Pt responded well to manual techniques to R glute and reported decreased pain following. Pt demonstrated bil fatigue with hip and core strengthening exercises, R>L. Pt needs continued skilled PT intervention to maximize overall function and decrease pain.   Rehab Potential Good   PT Frequency 2x / week   PT Duration 8 weeks   PT Treatment/Interventions Aquatic Therapy;Cryotherapy;Lobbyist;Therapeutic exercise;Therapeutic activities;Patient/family education;Manual techniques;Taping;Dry needling;Gait training;Stair training;Iontophoresis 4mg /ml Dexamethasone;Moist Heat   PT Next Visit Plan Progress isometric gluteal work, avoid piriformis stretching, soft tissue mobilization over any tender areas, utilize IFC as indicated    PT Home Exercise Plan Tennis ball massage over tender area, sidelying clamshells with belt to create isometric work.    Consulted and Agree with Plan of Care Patient      Patient will benefit from skilled therapeutic intervention in order to improve the following deficits and impairments:  Abnormal gait, Pain, Decreased mobility, Increased muscle spasms, Impaired flexibility, Difficulty walking, Decreased balance, Decreased activity tolerance, Decreased endurance, Decreased strength  Visit Diagnosis: Midline low back pain without sciatica  Difficulty in walking, not elsewhere classified     Problem List Patient Active Problem List   Diagnosis Date Noted  . Back pain, chronic 07/06/2016  . Adiposity 07/06/2016  . Skin lesion 06/19/2016  . IBS (irritable bowel syndrome) 12/21/2015  . Environmental allergies 04/21/2015  . Arthritis 04/21/2015  . GERD (gastroesophageal reflux disease) 04/21/2015  . Type 2 diabetes mellitus ([place]) 01/20/2015   Brittany Santos, SPT  Brittany Santos 08/10/2016, 5:16 PM  [place] [facility] [address], [place], [address] Phone: [phone]   Fax:  [phone]  Name: Brittany Santos [id number] MRN: [id number] Date of Birth: 06/01/1961

## 2016-08-15 ENCOUNTER — Ambulatory Visit: Payer: BLUE CROSS/BLUE SHIELD | Admitting: Physical Therapy

## 2016-08-15 DIAGNOSIS — R262 Difficulty in walking, not elsewhere classified: Secondary | ICD-10-CM

## 2016-08-15 DIAGNOSIS — M545 Low back pain, unspecified: Secondary | ICD-10-CM

## 2016-08-15 NOTE — Therapy (Signed)
[place] MAIN [facility] SERVICES [address] [place], [place], [address] Phone: [phone]   Fax:  [phone]  Physical Therapy Treatment  Patient Details  Name: Krisette Cloutier [id number] MRN: [id number] Date of Birth: 03-25-1961 No Data Recorded  Encounter Date: 08/15/2016      PT End of Session - 08/15/16 1120    Visit Number 8   Number of Visits 17   Date for PT Re-Evaluation 09/21/16   PT Start Time 1050   PT Stop Time 1130   PT Time Calculation (min) 40 min   Activity Tolerance Patient tolerated treatment well;No increased pain   Behavior During Therapy WFL for tasks assessed/performed      Past Medical History:  Diagnosis Date  . Allergy   . Arthritis   . Diabetes mellitus without complication ([place])   . GERD (gastroesophageal reflux disease)   . History of colon polyps   . History of diverticulitis   . History of kidney stones 04/15/15    Past Surgical History:  Procedure Laterality Date  . BILATERAL CARPAL TUNNEL RELEASE    . BREAST SURGERY    . cervical neck fusion    . CHOLECYSTECTOMY    . ESOPHAGOGASTRODUODENOSCOPY (EGD) WITH PROPOFOL N/A 05/12/2016   Procedure: ESOPHAGOGASTRODUODENOSCOPY (EGD) WITH PROPOFOL;  Surgeon: Lollie Sails, MD;  Location: [facility] ENDOSCOPY;  Service: Endoscopy;  Laterality: N/A;    There were no vitals filed for this visit.      Subjective Assessment - 08/15/16 1118    Subjective Pt reported she is able to do more activities before the pain sets in. Pt can stand and wash her dishes and is trying to remember better posture. Pt stated her back continued to hurt 2 days after  following her last land PT appt when she performed the "pointer dog" exercise. Pt arrived to appt today without pain.        O: Pt entered/exited the pool via steps with single UE support on rail.  50 ft =1 lap  Exercises performed in 3'6" depth   Stretches 4 laps perturbation training, manual applied by PT at waist  for stepping strategy 4 directions 8 laps grapevine 4 R, 4L  4 laps walking backwards, shoulder ER with noodles  4 laps  walking backwards, noodle behind waist, elbows extended, scapular depression  stretches  Relaxation 5'                               PT Long Term Goals - 07/20/16 1305      PT LONG TERM GOAL #1   Title Patient will report a modified ODI score of less than 30% disability to demonstrate improved tolerance for ADLs.    Baseline 60%   Time 8   Period Weeks   Status New     PT LONG TERM GOAL #2   Title Patient will complete 71m walk time in less than 10 seconds to return to community mobility.    Baseline >15 seconds    Time 8   Period Weeks   Status New     PT LONG TERM GOAL #3   Title Patient will be able to lift 50# off the floor with an increase in pain no more than 1/10 from baseline to return to work related activities.    Baseline Unable    Time 8   Period Weeks   Status New     PT  LONG TERM GOAL #4   Title Patient will be able to stand for at least 1 hour with minimal increase in pain to complete work related activities.    Time 8   Period Weeks   Status New               Plan - 08/15/16 1122    Clinical Impression Statement Pt reports she is able to perform more activities at home before pain sets in. Pt continues to progress well towards her goals as she remains compliant w/ HEP. Pt was advanced to dynamic stability exercises (grapevine Bil) and thoracolumbar strengthening (isometric scap depression/tricept activation) without difficulty. Pt will continue to benefit from skilled PT.       Patient will benefit from skilled therapeutic intervention in order to improve the following deficits and impairments:     Visit Diagnosis: Midline low back pain without sciatica  Difficulty in walking, not elsewhere classified     Problem List Patient Active Problem List   Diagnosis Date Noted  . Back pain, chronic  07/06/2016  . Adiposity 07/06/2016  . Skin lesion 06/19/2016  . IBS (irritable bowel syndrome) 12/21/2015  . Environmental allergies 04/21/2015  . Arthritis 04/21/2015  . GERD (gastroesophageal reflux disease) 04/21/2015  . Type 2 diabetes mellitus ([place]) 01/20/2015    [name] ,PT, DPT, E-RYT  08/15/2016, 11:25 AM  [place] MAIN [facility] SERVICES [address] [place], [place], [address] Phone: [phone]   Fax:  [phone]  Name: Amelinda Sieja [id number] MRN: [id number] Date of Birth: 25-Apr-1961

## 2016-08-17 ENCOUNTER — Ambulatory Visit: Payer: BLUE CROSS/BLUE SHIELD | Admitting: Physical Therapy

## 2016-08-17 DIAGNOSIS — M545 Low back pain, unspecified: Secondary | ICD-10-CM

## 2016-08-17 DIAGNOSIS — R262 Difficulty in walking, not elsewhere classified: Secondary | ICD-10-CM

## 2016-08-17 NOTE — Therapy (Signed)
[facility] [address], [place], [address] Phone: [phone]   Fax:  [phone]  Physical Therapy Treatment  Patient Details  Name: Brittany Santos [id number] MRN: [id number] Date of Birth: Jun 27, 1961 No Data Recorded  Encounter Date: 08/17/2016      PT End of Session - 08/17/16 1113    Visit Number 9   Number of Visits 17   Date for PT Re-Evaluation 09/21/16   PT Start Time [id number]   PT Stop Time 1115   PT Time Calculation (min) 43 min   Activity Tolerance Patient tolerated treatment well;No increased pain   Behavior During Therapy WFL for tasks assessed/performed      Past Medical History:  Diagnosis Date  . Allergy   . Arthritis   . Diabetes mellitus without complication ([place])   . GERD (gastroesophageal reflux disease)   . History of colon polyps   . History of diverticulitis   . History of kidney stones 04/15/15    Past Surgical History:  Procedure Laterality Date  . BILATERAL CARPAL TUNNEL RELEASE    . BREAST SURGERY    . cervical neck fusion    . CHOLECYSTECTOMY    . ESOPHAGOGASTRODUODENOSCOPY (EGD) WITH PROPOFOL N/A 05/12/2016   Procedure: ESOPHAGOGASTRODUODENOSCOPY (EGD) WITH PROPOFOL;  Surgeon: Lollie Sails, MD;  Location: [facility] END[facility]COPY;  Service: Endoscopy;  Laterality: N/A;    There were no vitals filed for this visit.      Subjective Assessment - 08/17/16 1049    Subjective Pt states she had increased back pain following last treatment session and reported having to take a pain pill last Friday and Saturday. States her aquatic therapy felt great.   Limitations Sitting;Lifting;Standing;Walking;House hold activities   Diagnostic tests X-ray/MRI indicating L2 compression fracture, though "healing nicely" per last scan.    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 Yes   Pain Score 4    Pain Location Back   Pain Orientation Right   Pain Descriptors / Indicators Sore   Pain Onset More than a month ago      Manual: Cross friction and ischemic trigger point release to R glute max, med, and piriformis x 12 min; pt tenderness decreased from previous sessions but still present and soft tissue restrictions remain; pt reported decreased discomfort/pain with walking following treatment  Therex: R sidelying isometric clamshells with blue strap, 3x10 with 3 sec hold; pt reported improved walking following exercise  bil standing hip abd with GTB, 3x10 each; pt demonstrated bil hip fatigue following  Sitting on grey physioball with alternating LE march without UE support, 5x15-20 each; progressed to LE march with ball toss 3x1 min bouts each; pt demonstrated BLE fatigue  bil side stepping with GTB, 8ft x 3 laps; min verbal cues for proper technique; pt demonstrated and verbalized bil hip fatigue   Multiple bouts of walking in the gym between exercises assessing response to exercises, pt had improved gait speed and quality  10MWT: 10.21 seconds       PT Education - 08/17/16 1051    Education provided (P)  Yes             PT Long Term Goals - 07/20/16 1305      PT LONG TERM GOAL #1   Title Patient will report a modified ODI score of less than 30% disability to demonstrate improved tolerance for ADLs.    Baseline 60%  Time 8   Period Weeks   Status New     PT LONG TERM GOAL #2   Title Patient will complete 56m walk time in less than 10 seconds to return to community mobility.    Baseline >15 seconds    Time 8   Period Weeks   Status New     PT LONG TERM GOAL #3   Title Patient will be able to lift 50# off the floor with an increase in pain no more than 1/10 from baseline to return to work related activities.    Baseline Unable    Time 8   Period Weeks   Status New     PT LONG TERM GOAL #4   Title Patient will be able to stand for at least 1 hour with minimal increase in pain to complete work related  activities.    Time 8   Period Weeks   Status New               Plan - 08/17/16 1117    Clinical Impression Statement Pt is making progress towards goals as evidenced by improved gait speed and activity tolerance at work. Pt had increased pain following last land session, likely due to performing bird dogs, indiciating that pt still needs core and hip strengthening. Pt's hip strengthening regressed this date and pt said that she did not have any back pain following treatment session. Pt needs continued skilled PT intervention to maximize overall function and strength.   Rehab Potential Good   PT Frequency 2x / week   PT Duration 8 weeks   PT Treatment/Interventions Aquatic Therapy;Cryotherapy;Lobbyist;Therapeutic exercise;Therapeutic activities;Patient/family education;Manual techniques;Taping;Dry needling;Gait training;Stair training;Iontophoresis 4mg /ml Dexamethasone;Moist Heat   PT Next Visit Plan Progress isometric gluteal work, avoid piriformis stretching, soft tissue mobilization over any tender areas, utilize IFC as indicated    PT Home Exercise Plan Tennis ball massage over tender area, sidelying clamshells with belt to create isometric work.    Consulted and Agree with Plan of Care Patient      Patient will benefit from skilled therapeutic intervention in order to improve the following deficits and impairments:  Abnormal gait, Pain, Decreased mobility, Increased muscle spasms, Impaired flexibility, Difficulty walking, Decreased balance, Decreased activity tolerance, Decreased endurance, Decreased strength  Visit Diagnosis: Midline low back pain without sciatica  Difficulty in walking, not elsewhere classified     Problem List Patient Active Problem List   Diagnosis Date Noted  . Back pain, chronic 07/06/2016  . Adiposity 07/06/2016  . Skin lesion 06/19/2016  . IBS (irritable bowel syndrome) 12/21/2015  . Environmental allergies  04/21/2015  . Arthritis 04/21/2015  . GERD (gastroesophageal reflux disease) 04/21/2015  . Type 2 diabetes mellitus ([place]) 01/20/2015   [name], SPT  [name] 08/17/2016, 11:27 AM  [facility] [address] [place], [address] Phone: [phone]   Fax:  [phone]  Name: Brittany Santos [id number] MRN: [id number] Date of Birth: Nov 13, 1961

## 2016-08-22 ENCOUNTER — Ambulatory Visit: Payer: BLUE CROSS/BLUE SHIELD | Attending: Sports Medicine | Admitting: Physical Therapy

## 2016-08-22 DIAGNOSIS — R262 Difficulty in walking, not elsewhere classified: Secondary | ICD-10-CM | POA: Insufficient documentation

## 2016-08-22 DIAGNOSIS — M545 Low back pain, unspecified: Secondary | ICD-10-CM

## 2016-08-23 ENCOUNTER — Ambulatory Visit: Payer: BLUE CROSS/BLUE SHIELD | Admitting: Physical Therapy

## 2016-08-23 ENCOUNTER — Encounter: Payer: Self-pay | Admitting: Physical Therapy

## 2016-08-23 DIAGNOSIS — M545 Low back pain, unspecified: Secondary | ICD-10-CM

## 2016-08-23 DIAGNOSIS — R262 Difficulty in walking, not elsewhere classified: Secondary | ICD-10-CM

## 2016-08-23 NOTE — Therapy (Signed)
[facility] [address] [place], [address] Phone: [phone]   Fax:  [phone]  Physical Therapy Treatment  Patient Details  Name: Brittany Santos [id number] MRN: [id number] Date of Birth: 07-Jan-1961 No Data Recorded  Encounter Date: 08/23/2016      PT End of Session - 08/23/16 1213    Visit Number 11   Number of Visits 17   Date for PT Re-Evaluation 09/21/16   PT Start Time 1120   PT Stop Time 1200   PT Time Calculation (min) 40 min   Activity Tolerance Patient tolerated treatment well;No increased pain   Behavior During Therapy WFL for tasks assessed/performed      Past Medical History:  Diagnosis Date  . Allergy   . Arthritis   . Diabetes mellitus without complication ([place])   . GERD (gastroesophageal reflux disease)   . History of colon polyps   . History of diverticulitis   . History of kidney stones 04/15/15    Past Surgical History:  Procedure Laterality Date  . BILATERAL CARPAL TUNNEL RELEASE    . BREAST SURGERY    . cervical neck fusion    . CHOLECYSTECTOMY    . ESOPHAGOGASTRODUODENOSCOPY (EGD) WITH PROPOFOL N/A 05/12/2016   Procedure: ESOPHAGOGASTRODUODENOSCOPY (EGD) WITH PROPOFOL;  Surgeon: Lollie Sails, MD;  Location: [facility] ENDOSCOPY;  Service: Endoscopy;  Laterality: N/A;    There were no vitals filed for this visit.      Subjective Assessment - 08/23/16 1126    Subjective Pt states she "feels okay." She asked that we not perform manual, soft tissue mobilization this date because she feels that increased her pain last session.   Limitations Sitting;Lifting;Standing;Walking;House hold activities   Diagnostic tests X-ray/MRI indicating L2 compression fracture, though "healing nicely" per last scan.    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 Yes   Pain Score 8    Pain Location Back   Pain Orientation Right   Pain Descriptors /  Indicators Sore   Pain Onset More than a month ago           Adult Aquatic Therapy - 08/22/16 0940      Aquatic Therapy Subjective   Subjective pt had no complaints      Moist heat to start therapy x 8 mins (unbilled)  Therex: Plantigrade position with plank holds on elevated mat, 5x30 sec holds to promote core strengthening; min verbal cues for proper positioning  Standing single arm rows with 10# 1x10, 2x15 with 15# sitting on physioball to promote core strengthening and upright posture  Standing lat pull downs with 20#, 3x10; min verbal cues for proper technique  Bil single leg balance on physioball with ball toss, 3x30-60 esc bouts each; CGA for balance, increased difficulty holding RLE up       PT Education - 08/23/16 1213    Education provided Yes   Education Details exercise technique, healing process of her specific injury   Person(s) Educated Patient   Methods Explanation   Comprehension Verbalized understanding             PT Long Term Goals - 07/20/16 1305      PT LONG TERM GOAL #1   Title Patient will report a modified ODI score of less than 30% disability to demonstrate improved tolerance for ADLs.    Baseline 60%   Time 8   Period Weeks   Status  New     PT LONG TERM GOAL #2   Title Patient will complete 59m walk time in less than 10 seconds to return to community mobility.    Baseline >15 seconds    Time 8   Period Weeks   Status New     PT LONG TERM GOAL #3   Title Patient will be able to lift 50# off the floor with an increase in pain no more than 1/10 from baseline to return to work related activities.    Baseline Unable    Time 8   Period Weeks   Status New     PT LONG TERM GOAL #4   Title Patient will be able to stand for at least 1 hour with minimal increase in pain to complete work related activities.    Time 8   Period Weeks   Status New               Plan - 08/23/16 1214    Clinical Impression Statement Pt  continues with LBP this date and request moist heat instead of soft tissue mobilization this date; she reported that the heat felt better and decreased her pain. She tolerated progressed core strengthening and anti-extension and anti-flexion exercises well this date. Pt needs continued skilled PT intervention to maximize overall function and decrease pain.   Rehab Potential Good   PT Frequency 2x / week   PT Duration 8 weeks   PT Treatment/Interventions Aquatic Therapy;Cryotherapy;Lobbyist;Therapeutic exercise;Therapeutic activities;Patient/family education;Manual techniques;Taping;Dry needling;Gait training;Stair training;Iontophoresis 4mg /ml Dexamethasone;Moist Heat   PT Next Visit Plan Progress isometric gluteal work, avoid piriformis stretching, soft tissue mobilization over any tender areas, utilize IFC as indicated    PT Home Exercise Plan Tennis ball massage over tender area, sidelying clamshells with belt to create isometric work.    Consulted and Agree with Plan of Care Patient      Patient will benefit from skilled therapeutic intervention in order to improve the following deficits and impairments:  Abnormal gait, Pain, Decreased mobility, Increased muscle spasms, Impaired flexibility, Difficulty walking, Decreased balance, Decreased activity tolerance, Decreased endurance, Decreased strength  Visit Diagnosis: Midline low back pain without sciatica  Difficulty in walking, not elsewhere classified     Problem List Patient Active Problem List   Diagnosis Date Noted  . Back pain, chronic 07/06/2016  . Adiposity 07/06/2016  . Skin lesion 06/19/2016  . IBS (irritable bowel syndrome) 12/21/2015  . Environmental allergies 04/21/2015  . Arthritis 04/21/2015  . GERD (gastroesophageal reflux disease) 04/21/2015  . Type 2 diabetes mellitus ([place]) 01/20/2015   [name], SPT  [name] 08/23/2016, 12:16 PM  [place] [facility] [address] [place], [address] Phone: [phone]   Fax:  [phone]  Name: Brittany Santos [id number] MRN: [id number] Date of Birth: 10-02-1961

## 2016-08-23 NOTE — Therapy (Addendum)
[place] [facility] SERVICES [address] [place], [place], [address] Phone: [phone]   Fax:  [phone]  Physical Therapy Treatment  Patient Details  Name: Brittany Santos [id number] MRN: [id number] Date of Birth: 04-25-1961 No Data Recorded  Encounter Date: 08/22/2016      PT End of Session - 08/23/16 2229    Visit Number 10   Number of Visits 17   Date for PT Re-Evaluation 09/21/16   PT Start Time 0900   PT Stop Time 0945   PT Time Calculation (min) 45 min   Activity Tolerance Patient tolerated treatment well;No increased pain   Behavior During Therapy WFL for tasks assessed/performed      Past Medical History:  Diagnosis Date  . Allergy   . Arthritis   . Diabetes mellitus without complication ([place])   . GERD (gastroesophageal reflux disease)   . History of colon polyps   . History of diverticulitis   . History of kidney stones 04/15/15    Past Surgical History:  Procedure Laterality Date  . BILATERAL CARPAL TUNNEL RELEASE    . BREAST SURGERY    . cervical neck fusion    . CHOLECYSTECTOMY    . ESOPHAGOGASTRODUODENOSCOPY (EGD) WITH PROPOFOL N/A 05/12/2016   Procedure: ESOPHAGOGASTRODUODENOSCOPY (EGD) WITH PROPOFOL;  Surgeon: Lollie Sails, MD;  Location: [facility] ENDOSCOPY;  Service: Endoscopy;  Laterality: N/A;    There were no vitals filed for this visit.      Subjective Assessment - 08/23/16 2231    Subjective  Pt stated the massage she received at her last land session caused pain that dissolved the following day. Pt continues to find aquatic therapy to be helpful and will look into joining a pool .  Pt used her friend's pool this weekend                     Adult Aquatic Therapy - 08/23/16 2231      Aquatic Therapy Subjective   Subjective pt had no complaints      O: Pt entered/exited the pool via steps with single UE support on rail.  50 ft =1 lap  Exercises performed in 3'6" depth   4 laps  walking backward with noodle in hand for shoulder ER  4 laps sidestepping with noodle held behind back under water for shoulder depression 4 laps grape vine L/R   Lateral step-ups with cuing with noodles for support in BUE  10 each side    5' relaxation            PT Education - 08/23/16 1213    Education provided Yes   Education Details exercise technique, healing process of her specific injury   Person(s) Educated Patient   Methods Explanation   Comprehension Verbalized understanding             PT Long Term Goals - 07/20/16 1305      PT LONG TERM GOAL #1   Title Patient will report a modified ODI score of less than 30% disability to demonstrate improved tolerance for ADLs.    Baseline 60%   Time 8   Period Weeks   Status New     PT LONG TERM GOAL #2   Title Patient will complete 66m walk time in less than 10 seconds to return to community mobility.    Baseline >15 seconds    Time 8   Period Weeks   Status New     PT LONG  TERM GOAL #3   Title Patient will be able to lift 50# off the floor with an increase in pain no more than 1/10 from baseline to return to work related activities.    Baseline Unable    Time 8   Period Weeks   Status New     PT LONG TERM GOAL #4   Title Patient will be able to stand for at least 1 hour with minimal increase in pain to complete work related activities.    Time 8   Period Weeks   Status New               Plan - 08/23/16 2234    Clinical Impression Statement Pt progressed to lateral step-ups in the pool today with cuing for knee alignment to decrease pain. Pt will continue to benefit from skilled PT.    Rehab Potential Good   PT Frequency 2x / week   PT Duration 8 weeks   PT Treatment/Interventions Aquatic Therapy;Cryotherapy;Lobbyist;Therapeutic exercise;Therapeutic activities;Patient/family education;Manual techniques;Taping;Dry needling;Gait training;Stair  training;Iontophoresis 4mg /ml Dexamethasone;Moist Heat   PT Next Visit Plan Progress isometric gluteal work, avoid piriformis stretching, soft tissue mobilization over any tender areas, utilize IFC as indicated    PT Home Exercise Plan Tennis ball massage over tender area, sidelying clamshells with belt to create isometric work.    Consulted and Agree with Plan of Care Patient      Patient will benefit from skilled therapeutic intervention in order to improve the following deficits and impairments:  Abnormal gait, Pain, Decreased mobility, Increased muscle spasms, Impaired flexibility, Difficulty walking, Decreased balance, Decreased activity tolerance, Decreased endurance, Decreased strength  Visit Diagnosis: Midline low back pain without sciatica  Difficulty in walking, not elsewhere classified     Problem List Patient Active Problem List   Diagnosis Date Noted  . Back pain, chronic 07/06/2016  . Adiposity 07/06/2016  . Skin lesion 06/19/2016  . IBS (irritable bowel syndrome) 12/21/2015  . Environmental allergies 04/21/2015  . Arthritis 04/21/2015  . GERD (gastroesophageal reflux disease) 04/21/2015  . Type 2 diabetes mellitus ([place]) 01/20/2015    [name] ,PT, DPT, E-RYT  08/23/2016, 10:34 PM  [place] [facility] SERVICES [address] [place], [place], [address] Phone: [phone]   Fax:  [phone]  Name: Brittany Santos [id number] MRN: [id number] Date of Birth: 05-17-1961

## 2016-08-30 ENCOUNTER — Ambulatory Visit: Payer: BLUE CROSS/BLUE SHIELD | Admitting: Physical Therapy

## 2016-08-30 ENCOUNTER — Encounter: Payer: Self-pay | Admitting: Physical Therapy

## 2016-08-30 DIAGNOSIS — M545 Low back pain, unspecified: Secondary | ICD-10-CM

## 2016-08-30 DIAGNOSIS — R262 Difficulty in walking, not elsewhere classified: Secondary | ICD-10-CM

## 2016-08-30 NOTE — Therapy (Signed)
[place] [facility] [address] [place], [address] Phone: [phone]   Fax:  [phone]  Physical Therapy Treatment  Patient Details  Name: Brittany Santos [id number] MRN: [id number] Date of Birth: 06/26/1961 No Data Recorded  Encounter Date: 08/30/2016      PT End of Session - 08/30/16 1529    Visit Number 13   Number of Visits 17   Date for PT Re-Evaluation 09/21/16   PT Start Time 1525   PT Stop Time 1605   PT Time Calculation (min) 40 min   Activity Tolerance Patient tolerated treatment well;No increased pain   Behavior During Therapy WFL for tasks assessed/performed      Past Medical History:  Diagnosis Date  . Allergy   . Arthritis   . Diabetes mellitus without complication ([place])   . GERD (gastroesophageal reflux disease)   . History of colon polyps   . History of diverticulitis   . History of kidney stones 04/15/15    Past Surgical History:  Procedure Laterality Date  . BILATERAL CARPAL TUNNEL RELEASE    . BREAST SURGERY    . cervical neck fusion    . CHOLECYSTECTOMY    . ESOPHAGOGASTRODUODENOSCOPY (EGD) WITH PROPOFOL N/A 05/12/2016   Procedure: ESOPHAGOGASTRODUODENOSCOPY (EGD) WITH PROPOFOL;  Surgeon: Lollie Sails, MD;  Location: [facility] ENDOSCOPY;  Service: Endoscopy;  Laterality: N/A;    There were no vitals filed for this visit.      Subjective Assessment - 08/30/16 1527    Subjective Pt reports increased pain around R gluteal region.    Currently in Pain? Yes   Pain Score 4    Pain Location Hip   Pain Orientation Right   Pain Descriptors / Indicators Aching      Isometric clamshells with belt, 3x10 with 5-10 sec holds each  Walking multiple laps in gym following isometric clamshells and hip IR/ER ROM assessed; pt reported slightly decreased pain with walking following both  Educated on updated HEP: standing hip abd with YTB progressing to RTB, standing hip extension with YTB  progressing to RTB, and sit <> stand with decreasing seat height initially with BUE support progressing to no UE support      PT Education - 08/30/16 1748    Education provided Yes   Education Details updated HEP   Person(s) Educated Patient   Methods Explanation   Comprehension Verbalized understanding             PT Long Term Goals - 07/20/16 1305      PT LONG TERM GOAL #1   Title Patient will report a modified ODI score of less than 30% disability to demonstrate improved tolerance for ADLs.    Baseline 60%   Time 8   Period Weeks   Status New     PT LONG TERM GOAL #2   Title Patient will complete 38m walk time in less than 10 seconds to return to community mobility.    Baseline >15 seconds    Time 8   Period Weeks   Status New     PT LONG TERM GOAL #3   Title Patient will be able to lift 50# off the floor with an increase in pain no more than 1/10 from baseline to return to work related activities.    Baseline Unable    Time 8   Period Weeks   Status New     PT LONG TERM GOAL #4   Title Patient  will be able to stand for at least 1 hour with minimal increase in pain to complete work related activities.    Time 8   Period Weeks   Status New               Plan - 08/30/16 1752    Clinical Impression Statement Pt reported increased pain at R lower mid back and R gluteal region this date. Pt hip ROM and piriformis length assessed which were both WNL. Pt verbalized that she is not as sore following pool sessions as she is when she leaves land therapy; she states that feels she gets more of a workout with land compared to the pool. PT with speak to her pool PT and discuss future appointment options to either progress pool exercises or increased land therapy sessions. Pt main limiting factor is pain with sit > stand and with bending forward. Pt given updated HEP to address this. Pt needs continued skilled PT intervention to maximize overall function.   Rehab  Potential Good   PT Frequency 2x / week   PT Duration 8 weeks   PT Treatment/Interventions Aquatic Therapy;Cryotherapy;Lobbyist;Therapeutic exercise;Therapeutic activities;Patient/family education;Manual techniques;Taping;Dry needling;Gait training;Stair training;Iontophoresis 4mg /ml Dexamethasone;Moist Heat   PT Next Visit Plan Progress isometric gluteal work, avoid piriformis stretching, soft tissue mobilization over any tender areas, utilize IFC as indicated    PT Home Exercise Plan Tennis ball massage over tender area, sidelying clamshells with belt to create isometric work.    Consulted and Agree with Plan of Care Patient      Patient will benefit from skilled therapeutic intervention in order to improve the following deficits and impairments:  Abnormal gait, Pain, Decreased mobility, Increased muscle spasms, Impaired flexibility, Difficulty walking, Decreased balance, Decreased activity tolerance, Decreased endurance, Decreased strength  Visit Diagnosis: Midline low back pain without sciatica  Difficulty in walking, not elsewhere classified     Problem List Patient Active Problem List   Diagnosis Date Noted  . Back pain, chronic 07/06/2016  . Adiposity 07/06/2016  . Skin lesion 06/19/2016  . IBS (irritable bowel syndrome) 12/21/2015  . Environmental allergies 04/21/2015  . Arthritis 04/21/2015  . GERD (gastroesophageal reflux disease) 04/21/2015  . Type 2 diabetes mellitus ([place]) 01/20/2015   [name], SPT  [name] 08/30/2016, 5:53 PM  [place] [facility] [address], [place], [address] Phone: [phone]   Fax:  [phone]  Name: Brittany Santos [id number] MRN: [id number] Date of Birth: [birth year]

## 2016-08-30 NOTE — Patient Instructions (Signed)
Standing hip abd and hip extension with YTB progressing to RTB Sit <> stand with decreasing seat height

## 2016-08-31 ENCOUNTER — Ambulatory Visit: Payer: BLUE CROSS/BLUE SHIELD | Admitting: Physical Therapy

## 2016-08-31 DIAGNOSIS — M545 Low back pain, unspecified: Secondary | ICD-10-CM

## 2016-08-31 DIAGNOSIS — R262 Difficulty in walking, not elsewhere classified: Secondary | ICD-10-CM

## 2016-09-01 NOTE — Therapy (Signed)
[place] MAIN [facility] SERVICES [address] [place], [place], [address] Phone: [phone]   Fax:  [phone]  Physical Therapy Treatment  Patient Details  Name: Brittany Santos [id number] MRN: [id number] Date of Birth: 1961-08-21 No Data Recorded  Encounter Date: 08/31/2016      PT End of Session - 09/01/16 2016    Visit Number 14   Number of Visits 17   Date for PT Re-Evaluation 09/21/16   PT Start Time 1100   PT Stop Time 1135   PT Time Calculation (min) 35 min   Activity Tolerance Patient tolerated treatment well;No increased pain   Behavior During Therapy WFL for tasks assessed/performed      Past Medical History:  Diagnosis Date  . Allergy   . Arthritis   . Diabetes mellitus without complication ([place])   . GERD (gastroesophageal reflux disease)   . History of colon polyps   . History of diverticulitis   . History of kidney stones 04/15/15    Past Surgical History:  Procedure Laterality Date  . BILATERAL CARPAL TUNNEL RELEASE    . BREAST SURGERY    . cervical neck fusion    . CHOLECYSTECTOMY    . ESOPHAGOGASTRODUODENOSCOPY (EGD) WITH PROPOFOL N/A 05/12/2016   Procedure: ESOPHAGOGASTRODUODENOSCOPY (EGD) WITH PROPOFOL;  Surgeon: Lollie Sails, MD;  Location: [facility] ENDOSCOPY;  Service: Endoscopy;  Laterality: N/A;    There were no vitals filed for this visit.      Subjective Assessment - 09/01/16 2016    Subjective Pt   Limitations Sitting;Lifting;Standing;Walking;House hold activities   Diagnostic tests X-ray/MRI indicating L2 compression fracture, though "healing nicely" per last scan.    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    Baseline 60%   Time 8   Period Weeks   Status New     PT LONG TERM GOAL #2   Title Patient will complete 58m walk time in less than 10 seconds to return to community mobility.    Baseline >15 seconds    Time 8   Period Weeks   Status New     PT LONG TERM GOAL #3   Title Patient will be able to lift 50# off the floor with an increase in pain no more than 1/10 from baseline to return to work related activities.    Baseline Unable    Time 8   Period Weeks   Status New     PT LONG TERM GOAL #4   Title Patient will be able to stand for at least 1 hour with minimal increase in pain to complete work related activities.    Time 8   Period Weeks   Status New               Plan - 09/01/16 2017    Clinical Impression Statement Pt   Rehab  Potential Good   PT Frequency 2x / week   PT Duration 8 weeks   PT Treatment/Interventions Aquatic Therapy;Cryotherapy;Lobbyist;Therapeutic exercise;Therapeutic activities;Patient/family education;Manual techniques;Taping;Dry needling;Gait training;Stair training;Iontophoresis 4mg /ml Dexamethasone;Moist Heat   PT Next Visit Plan Progress isometric gluteal work, avoid piriformis stretching, soft tissue mobilization over any tender areas, utilize IFC as indicated    PT Home Exercise Plan Tennis ball massage over tender area, sidelying clamshells with belt to create isometric work.    Consulted and Agree with Plan of Care Patient      Patient will benefit from skilled therapeutic intervention in order to improve the following deficits and impairments:  Abnormal gait, Pain, Decreased mobility, Increased muscle spasms, Impaired flexibility, Difficulty walking, Decreased balance, Decreased activity tolerance, Decreased endurance, Decreased strength  Visit Diagnosis: Midline low back pain without sciatica  Difficulty  in walking, not elsewhere classified     Problem List Patient Active Problem List   Diagnosis Date Noted  . Back pain, chronic 07/06/2016  . Adiposity 07/06/2016  . Skin lesion 06/19/2016  . IBS (irritable bowel syndrome) 12/21/2015  . Environmental allergies 04/21/2015  . Arthritis 04/21/2015  . GERD (gastroesophageal reflux disease) 04/21/2015  . Type 2 diabetes mellitus ([place]) 01/20/2015    [name] ,PT, DPT, E-RYT  09/01/2016, 8:18 PM  [place] MAIN [facility] SERVICES [address] [place], [place], [address] Phone: [phone]   Fax:  [phone]  Name: Yury Zepf [id number] MRN: [id number] Date of Birth: 01-31-1961

## 2016-09-04 ENCOUNTER — Ambulatory Visit: Payer: BLUE CROSS/BLUE SHIELD | Admitting: Physical Therapy

## 2016-09-07 ENCOUNTER — Ambulatory Visit: Payer: BLUE CROSS/BLUE SHIELD | Admitting: Physical Therapy

## 2016-09-07 ENCOUNTER — Encounter: Payer: Self-pay | Admitting: Physical Therapy

## 2016-09-07 DIAGNOSIS — R262 Difficulty in walking, not elsewhere classified: Secondary | ICD-10-CM

## 2016-09-07 DIAGNOSIS — M545 Low back pain, unspecified: Secondary | ICD-10-CM

## 2016-09-07 NOTE — Therapy (Signed)
[place] [facility] [address] [place], [address] Phone: [phone]   Fax:  [phone]  Physical Therapy Treatment  Patient Details  Name: Brittany Santos [id number] MRN: [id number] Date of Birth: December 18, 1961 No Data Recorded  Encounter Date: 09/07/2016      PT End of Session - 09/07/16 0951    Visit Number 15   Number of Visits 17   Date for PT Re-Evaluation 09/21/16   PT Start Time 0945   PT Stop Time 1030   PT Time Calculation (min) 45 min   Activity Tolerance Patient tolerated treatment well;No increased pain   Behavior During Therapy WFL for tasks assessed/performed      Past Medical History:  Diagnosis Date  . Allergy   . Arthritis   . Diabetes mellitus without complication ([place])   . GERD (gastroesophageal reflux disease)   . History of colon polyps   . History of diverticulitis   . History of kidney stones 04/15/15    Past Surgical History:  Procedure Laterality Date  . BILATERAL CARPAL TUNNEL RELEASE    . BREAST SURGERY    . cervical neck fusion    . CHOLECYSTECTOMY    . ESOPHAGOGASTRODUODENOSCOPY (EGD) WITH PROPOFOL N/A 05/12/2016   Procedure: ESOPHAGOGASTRODUODENOSCOPY (EGD) WITH PROPOFOL;  Surgeon: Lollie Sails, MD;  Location: [facility] ENDOSCOPY;  Service: Endoscopy;  Laterality: N/A;    There were no vitals filed for this visit.      Subjective Assessment - 09/07/16 0950    Subjective Pt reports she had a cortisone injection in her R hip on Monday and states "I'm so much better. Not completely better but it doesn't hurt nearly as bad."   Limitations Sitting;Lifting;Standing;Walking;House hold activities   Diagnostic tests X-ray/MRI indicating L2 compression fracture, though "healing nicely" per last scan.    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 Yes   Pain Score --  "mild fatigue"     Iso ball squeezes (small green ball) with glute  set, 3x10 with 5 sec hold  Bil iso clamshells with belt, 3x10 with 5 sec hold  SLS BLE 30 sec; increased difficulty with RLE and demo trendelenberg on R  Bil SLS on airex with paloff press with RTB, 3x10 each; pt demo trendelenberg on R, cued to correct and pt reported increased fatigue when trying to maintain proper hip position  bil hip hikes on 4" step, 3x10 each; pt reported increased back pain with first set so cued pt to decrease ROM throughout and pt reported no back pain and improved gluteal contraction; pt demo fatigue at end  bil step ups with 1 riser x 10; bil step ups with contralateral march with 1 riser x 10 each; pt reported increased "pain" at upper gluteal/lower back region; SPT and PT educated that her endurance and strength are still improving and this is likely increased fatigue; educated her that she will likely have increased soreness following but that within the next few days she should feel better and be able to do more with less pain      PT Education - 09/07/16 1140    Education provided Yes   Education Details expected soreness following exercise but will help minimize pain in the next few days   Person(s) Educated Patient   Methods Explanation   Comprehension Verbalized understanding             PT Long Term  Goals - 07/20/16 1305      PT LONG TERM GOAL #1   Title Patient will report a modified ODI score of less than 30% disability to demonstrate improved tolerance for ADLs.    Baseline 60%   Time 8   Period Weeks   Status New     PT LONG TERM GOAL #2   Title Patient will complete 87m walk time in less than 10 seconds to return to community mobility.    Baseline >15 seconds    Time 8   Period Weeks   Status New     PT LONG TERM GOAL #3   Title Patient will be able to lift 50# off the floor with an increase in pain no more than 1/10 from baseline to return to work related activities.    Baseline Unable    Time 8   Period Weeks   Status New      PT LONG TERM GOAL #4   Title Patient will be able to stand for at least 1 hour with minimal increase in pain to complete work related activities.    Time 8   Period Weeks   Status New               Plan - 09/07/16 1143    Clinical Impression Statement Pt presents to therapy today with decreased pain in R gluteal region and decreased LBP s/p cortisone shot on Monday (9/18). Pt tolerated hip strengthening this date with minimal increase in pain followng session, which was likely hip musculature fatigue; pt educated that she may have increased soreness/pain initially, but that within the next few days, she should be able to do more and feel better with less pain. Pt verbalized understanding and confirmed that since improving her strength, she is noticing that she can do more with less pain. Due to pt's progress with land therapy and her subjective reports of decreased challenge with aquatic therapy, pt will hold off on aquatic therapy sessions at this time. Pt needs continued skilled PT intervention to maximize overall strength and function.   Rehab Potential Good   PT Frequency 2x / week   PT Duration 8 weeks   PT Treatment/Interventions Aquatic Therapy;Cryotherapy;Lobbyist;Therapeutic exercise;Therapeutic activities;Patient/family education;Manual techniques;Taping;Dry needling;Gait training;Stair training;Iontophoresis 4mg /ml Dexamethasone;Moist Heat   PT Next Visit Plan Progress isometric gluteal work, avoid piriformis stretching, soft tissue mobilization over any tender areas, utilize IFC as indicated    PT Home Exercise Plan Tennis ball massage over tender area, sidelying clamshells with belt to create isometric work.    Consulted and Agree with Plan of Care Patient      Patient will benefit from skilled therapeutic intervention in order to improve the following deficits and impairments:  Abnormal gait, Pain, Decreased mobility, Increased muscle  spasms, Impaired flexibility, Difficulty walking, Decreased balance, Decreased activity tolerance, Decreased endurance, Decreased strength  Visit Diagnosis: Midline low back pain without sciatica  Difficulty in walking, not elsewhere classified     Problem List Patient Active Problem List   Diagnosis Date Noted  . Back pain, chronic 07/06/2016  . Adiposity 07/06/2016  . Skin lesion 06/19/2016  . IBS (irritable bowel syndrome) 12/21/2015  . Environmental allergies 04/21/2015  . Arthritis 04/21/2015  . GERD (gastroesophageal reflux disease) 04/21/2015  . Type 2 diabetes mellitus ([place]) 01/20/2015   [name], SPT  [name] 09/07/2016, 11:48 AM  [place] [facility] [address] [place], [address] Phone:  [phone]   Fax:  [phone]  Name: Jayleah Lovaglio [id number] MRN: [id number] Date of Birth: 02/15/1961

## 2016-09-11 ENCOUNTER — Encounter: Payer: BLUE CROSS/BLUE SHIELD | Admitting: Physical Therapy

## 2016-09-13 ENCOUNTER — Ambulatory Visit: Payer: BLUE CROSS/BLUE SHIELD | Admitting: Physical Therapy

## 2016-09-13 ENCOUNTER — Encounter: Payer: Self-pay | Admitting: Physical Therapy

## 2016-09-13 DIAGNOSIS — M545 Low back pain, unspecified: Secondary | ICD-10-CM

## 2016-09-13 DIAGNOSIS — R262 Difficulty in walking, not elsewhere classified: Secondary | ICD-10-CM

## 2016-09-13 NOTE — Therapy (Signed)
[place] [facility] [address], [place], [address] Phone: [phone]   Fax:  [phone]  Physical Therapy Treatment  Patient Details  Name: Brittany Santos [id number] MRN: [id number] Date of Birth: 29-Feb-1961 No Data Recorded  Encounter Date: 09/13/2016      PT End of Session - 09/13/16 1703    Visit Number 16   Number of Visits 17   Date for PT Re-Evaluation 09/21/16   PT Start Time [id number]   PT Stop Time [id number]   PT Time Calculation (min) 40 min   Activity Tolerance Patient tolerated treatment well;No increased pain   Behavior During Therapy WFL for tasks assessed/performed      Past Medical History:  Diagnosis Date  . Allergy   . Arthritis   . Diabetes mellitus without complication ([place])   . GERD (gastroesophageal reflux disease)   . History of colon polyps   . History of diverticulitis   . History of kidney stones 04/15/15    Past Surgical History:  Procedure Laterality Date  . BILATERAL CARPAL TUNNEL RELEASE    . BREAST SURGERY    . cervical neck fusion    . CHOLECYSTECTOMY    . ESOPHAGOGASTRODUODENOSCOPY (EGD) WITH PROPOFOL N/A 05/12/2016   Procedure: ESOPHAGOGASTRODUODENOSCOPY (EGD) WITH PROPOFOL;  Surgeon: Lollie Sails, MD;  Location: [facility] ENDOSCOPY;  Service: Endoscopy;  Laterality: N/A;    There were no vitals filed for this visit.      Subjective Assessment - 09/13/16 1706    Subjective Pt reports that she has been in severe pain since Thursday night, from her mid back to her mid posterior thigh. She states "it feels like I have a pinched nerve and I can't move it." She reports it hurts to lay down and she doesn't really have a position of comfort, but that she 'gets used to whatever position" she's in.   Limitations Sitting;Lifting;Standing;Walking;House hold activities   Diagnostic tests X-ray/MRI indicating L2 compression fracture, though "healing nicely" per last scan.    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 Yes   Pain Score --  severe   Pain Location Back   Pain Orientation Right;Left;Mid      Moist heat x 20 mins for pain control  Moist heat with IFC to R mid back to gluteal region x 15 mins Parameters: 10.50mA Beat low 80Hz  Carrier frequency 4000Hz   Pt reported decreased pain following IFC.       PT Education - 09/13/16 1747    Education provided Yes   Education Details pain managment at home; different sleeping positions   Person(s) Educated Patient   Methods Explanation   Comprehension Verbalized understanding             PT Long Term Goals - 07/20/16 1305      PT LONG TERM GOAL #1   Title Patient will report a modified ODI score of less than 30% disability to demonstrate improved tolerance for ADLs.    Baseline 60%   Time 8   Period Weeks   Status New     PT LONG TERM GOAL #2   Title Patient will complete 70m walk time in less than 10 seconds to return to community mobility.    Baseline >15 seconds    Time 8   Period Weeks   Status New     PT LONG TERM GOAL #3   Title Patient will  be able to lift 50# off the floor with an increase in pain no more than 1/10 from baseline to return to work related activities.    Baseline Unable    Time 8   Period Weeks   Status New     PT LONG TERM GOAL #4   Title Patient will be able to stand for at least 1 hour with minimal increase in pain to complete work related activities.    Time 8   Period Weeks   Status New               Plan - 09/13/16 1746    Clinical Impression Statement Pt presented to therapy today with increased R sided back pain and gluteal pain. She reported she felt a slight twinge when performing step ups with march last session on Thursday (9/21); she said she did not do anything later that night, but the pain continued to get worse, then she attempted to [place] her lawn Saturday and she has been in "excruciating pain"  ever since. She ambulated into therapy with SPC to in case she gets a back spasm to prevent herself from falling. She is going to see Dr. Alfonso Ramus tomorrow regarding this pain. She tolerated moist heat and IFC with moist heat well for pain control and reported feeling better following IFC and moist heat.   Rehab Potential Good   PT Frequency 2x / week   PT Duration 8 weeks   PT Treatment/Interventions Aquatic Therapy;Cryotherapy;Lobbyist;Therapeutic exercise;Therapeutic activities;Patient/family education;Manual techniques;Taping;Dry needling;Gait training;Stair training;Iontophoresis 4mg /ml Dexamethasone;Moist Heat   PT Next Visit Plan Progress isometric gluteal work, avoid piriformis stretching, soft tissue mobilization over any tender areas, utilize IFC as indicated    PT Home Exercise Plan Tennis ball massage over tender area, sidelying clamshells with belt to create isometric work.    Consulted and Agree with Plan of Care Patient      Patient will benefit from skilled therapeutic intervention in order to improve the following deficits and impairments:  Abnormal gait, Pain, Decreased mobility, Increased muscle spasms, Impaired flexibility, Difficulty walking, Decreased balance, Decreased activity tolerance, Decreased endurance, Decreased strength  Visit Diagnosis: Midline low back pain without sciatica  Difficulty in walking, not elsewhere classified     Problem List Patient Active Problem List   Diagnosis Date Noted  . Back pain, chronic 07/06/2016  . Adiposity 07/06/2016  . Skin lesion 06/19/2016  . IBS (irritable bowel syndrome) 12/21/2015  . Environmental allergies 04/21/2015  . Arthritis 04/21/2015  . GERD (gastroesophageal reflux disease) 04/21/2015  . Type 2 diabetes mellitus ([place]) 01/20/2015   [name], SPT  [name] 09/13/2016, 5:48 PM  [place] [facility] [address] [place], [address] Phone: [phone]   Fax:  [phone]  Name: Nevae Frigon [id number] MRN: [id number] Date of Birth: Oct 10, 1961

## 2016-09-14 ENCOUNTER — Ambulatory Visit: Payer: BLUE CROSS/BLUE SHIELD | Admitting: Physical Therapy

## 2016-09-19 ENCOUNTER — Encounter: Payer: Self-pay | Admitting: Physical Therapy

## 2016-09-19 ENCOUNTER — Ambulatory Visit: Payer: BLUE CROSS/BLUE SHIELD | Admitting: Physical Therapy

## 2016-09-19 ENCOUNTER — Ambulatory Visit: Payer: BLUE CROSS/BLUE SHIELD | Attending: Sports Medicine

## 2016-09-19 DIAGNOSIS — G8929 Other chronic pain: Secondary | ICD-10-CM

## 2016-09-19 DIAGNOSIS — R262 Difficulty in walking, not elsewhere classified: Secondary | ICD-10-CM | POA: Diagnosis not present

## 2016-09-19 DIAGNOSIS — M545 Low back pain: Secondary | ICD-10-CM | POA: Insufficient documentation

## 2016-09-19 NOTE — Therapy (Signed)
[place] [facility] [address], [place], [address] Phone: [phone]   Fax:  [phone]  Physical Therapy Treatment  Patient Details  Name: Brittany Santos [id number] MRN: [id number] Date of Birth: January 04, 1961 No Data Recorded  Encounter Date: 09/19/2016      PT End of Session - 09/19/16 1631    Visit Number 17   Number of Visits 17   Date for PT Re-Evaluation 09/21/16   PT Start Time 1635   PT Stop Time 1710   PT Time Calculation (min) 35 min   Activity Tolerance Patient tolerated treatment well   Behavior During Therapy [facility] for tasks assessed/performed      Past Medical History:  Diagnosis Date  . Allergy   . Arthritis   . Diabetes mellitus without complication ([place])   . GERD (gastroesophageal reflux disease)   . History of colon polyps   . History of diverticulitis   . History of kidney stones 04/15/15    Past Surgical History:  Procedure Laterality Date  . BILATERAL CARPAL TUNNEL RELEASE    . BREAST SURGERY    . cervical neck fusion    . CHOLECYSTECTOMY    . ESOPHAGOGASTRODUODENOSCOPY (EGD) WITH PROPOFOL N/A 05/12/2016   Procedure: ESOPHAGOGASTRODUODENOSCOPY (EGD) WITH PROPOFOL;  Surgeon: Lollie Sails, MD;  Location: [facility] ENDOSCOPY;  Service: Endoscopy;  Laterality: N/A;    There were no vitals filed for this visit.      Subjective Assessment - 09/19/16 1629    Subjective Pt states that she went back to see her physician at [place]. She was placed on muscle relaxers and anti-inflammatories by her physician because she was having so much pain. She stopped taking the muscle relaxers because they were making her too tired. She has continued with the anti-infalmmatories which have helped. She is performing HEP but was unable to perform them over the weekend due to increased pain. She is scheduled to return to work this Friday and work every Friday going forward. No specific questions  or concerns currently.    Limitations Sitting;Lifting;Standing;Walking;House hold activities   Diagnostic tests X-ray/MRI indicating L2 compression fracture, though "healing nicely" per last scan.    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 Yes   Pain Score 3    Pain Location Back   Pain Orientation Right;Mid;Lower   Pain Descriptors / Indicators Aching   Pain Type Chronic pain   Pain Onset More than a month ago   Pain Frequency Constant   Multiple Pain Sites No       TREATMENT    THER-EX Iso ball squeezes (small basketball) in hooklying with glute set, 3 x 10 with 5 sec hold Bil iso clamshells with belt, 3x10 with 5 sec hold Isometric manually resisted lumbar rotation 5 sec hold 2 x 10 each direction; Sidelying hip circles CW/CCW x 10 each bilaterally, with cues to prevent rolling onto back; Sidelying hip abduction x 10 bilaterally; TrA contractions with 5 second hold x 10, added alternating LE marches x 10; Plantigrade position with red tband resisted hip abduction and extension bilaterally x 10 each;  Pt requires intermittent rest breaks due to fatigue. Cues and education provided regarding proper form/technique with exercises. Considerable decrease in R hip strength noted during sidelying exercises.                       PT Education -  09/19/16 1630    Education provided Yes   Education Details pain management and graded exercise intensity   Person(s) Educated Patient   Methods Explanation   Comprehension Verbalized understanding             PT Long Term Goals - 07/20/16 1305      PT LONG TERM GOAL #1   Title Patient will report a modified ODI score of less than 30% disability to demonstrate improved tolerance for ADLs.    Baseline 60%   Time 8   Period Weeks   Status New     PT LONG TERM GOAL #2   Title Patient will complete 1m walk time in less than 10 seconds to return to community  mobility.    Baseline >15 seconds    Time 8   Period Weeks   Status New     PT LONG TERM GOAL #3   Title Patient will be able to lift 50# off the floor with an increase in pain no more than 1/10 from baseline to return to work related activities.    Baseline Unable    Time 8   Period Weeks   Status New     PT LONG TERM GOAL #4   Title Patient will be able to stand for at least 1 hour with minimal increase in pain to complete work related activities.    Time 8   Period Weeks   Status New               Plan - 09/19/16 1631    Clinical Impression Statement Pt demonstrates improved pain tolerance today to activity. Most exercises are performed in hooklying/sidelying positions to minimize axial loading of vertebrae. Pt demonstrates considerably more weakness in R hip during sidelying exercises. Pt encouraged to continue HEP and follow-up as scheduled. Her progress remains limited due to pain. At next visit will reassess progress and recert/discharge pending progress with goals.    Rehab Potential Good   PT Frequency 2x / week   PT Duration 8 weeks   PT Treatment/Interventions Aquatic Therapy;Cryotherapy;Lobbyist;Therapeutic exercise;Therapeutic activities;Patient/family education;Manual techniques;Taping;Dry needling;Gait training;Stair training;Iontophoresis 4mg /ml Dexamethasone;Moist Heat   PT Next Visit Plan Repeat outcome measures/update goals and recert vs discharge as appropriate. Progress isometric gluteal work, avoid piriformis stretching, soft tissue mobilization over any tender areas, utilize IFC as indicated    PT Home Exercise Plan Tennis ball massage over tender area, sidelying clamshells with belt to create isometric work.    Consulted and Agree with Plan of Care Patient      Patient will benefit from skilled therapeutic intervention in order to improve the following deficits and impairments:  Abnormal gait, Pain, Decreased mobility,  Increased muscle spasms, Impaired flexibility, Difficulty walking, Decreased balance, Decreased activity tolerance, Decreased endurance, Decreased strength  Visit Diagnosis: Chronic midline low back pain without sciatica  Difficulty in walking, not elsewhere classified     Problem List Patient Active Problem List   Diagnosis Date Noted  . Back pain, chronic 07/06/2016  . Adiposity 07/06/2016  . Skin lesion 06/19/2016  . IBS (irritable bowel syndrome) 12/21/2015  . Environmental allergies 04/21/2015  . Arthritis 04/21/2015  . GERD (gastroesophageal reflux disease) 04/21/2015  . Type 2 diabetes mellitus ([place]) 01/20/2015    Phillips Grout PT, DPT   Huprich,Jason 09/20/2016, 11:46 AM  [place] [facility] [address] [place], [address] Phone: [phone]   Fax:  [phone]  Name:  Brittany Santos [id number] MRN: [id number] Date of Birth: 30-May-1961

## 2016-09-21 ENCOUNTER — Ambulatory Visit: Payer: BLUE CROSS/BLUE SHIELD | Admitting: Physical Therapy

## 2016-09-21 ENCOUNTER — Ambulatory Visit: Payer: BLUE CROSS/BLUE SHIELD

## 2016-09-21 DIAGNOSIS — G8929 Other chronic pain: Secondary | ICD-10-CM | POA: Diagnosis not present

## 2016-09-21 DIAGNOSIS — M545 Low back pain: Principal | ICD-10-CM

## 2016-09-21 DIAGNOSIS — R262 Difficulty in walking, not elsewhere classified: Secondary | ICD-10-CM

## 2016-09-21 NOTE — Therapy (Signed)
[place] [facility] [address], [place], [address] Phone: [phone]   Fax:  [phone]  Physical Therapy Treatment  Patient Details  Name: Brittany Santos GURKYHCWC MRN: [id number] Date of Birth: 11/21/1961 No Data Recorded  Encounter Date: 09/21/2016      PT End of Session - 09/22/16 1232    Visit Number 18   Number of Visits 23   Date for PT Re-Evaluation 11/02/16   PT Start Time 1635   PT Stop Time 1725   PT Time Calculation (min) 50 min   Activity Tolerance Patient tolerated treatment well   Behavior During Therapy [facility] for tasks assessed/performed      Past Medical History:  Diagnosis Date  . Allergy   . Arthritis   . Diabetes mellitus without complication ([place])   . GERD (gastroesophageal reflux disease)   . History of colon polyps   . History of diverticulitis   . History of kidney stones 04/15/15    Past Surgical History:  Procedure Laterality Date  . BILATERAL CARPAL TUNNEL RELEASE    . BREAST SURGERY    . cervical neck fusion    . CHOLECYSTECTOMY    . ESOPHAGOGASTRODUODENOSCOPY (EGD) WITH PROPOFOL N/A 05/12/2016   Procedure: ESOPHAGOGASTRODUODENOSCOPY (EGD) WITH PROPOFOL;  Surgeon: Lollie Sails, MD;  Location: [facility] ENDOSCOPY;  Service: Endoscopy;  Laterality: N/A;    There were no vitals filed for this visit.      Subjective Assessment - 09/21/16 1635    Subjective Pt denies increase in hip or back soreness after last therapy session. She reports some increased R low back/posterior hip pain upon arrival. Pt rates pain as 3/10. HEP is going well. No specific questions or concerns currently.    Limitations Sitting;Lifting;Standing;Walking;House hold activities   Diagnostic tests X-ray/MRI indicating L2 compression fracture, though "healing nicely" per last scan.    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 Yes   Pain Score 3    Pain  Location Back   Pain Orientation Right;Lower   Pain Descriptors / Indicators Aching   Pain Type Chronic pain   Pain Onset More than a month ago   Pain Frequency Constant   Multiple Pain Sites No      THER-EX NuStep L2 x 5 minutes for warm-up during history (2 minutes unbilled); Iso ball squeezes(green ball)in hooklying with glute set, 2 x 10 with 5 sec hold Bil iso clamshells with belt, 2 x 10 with 5 sec hold Isometric manually resisted lumbar rotation 5 sec hold x 10 each direction; TrA contraction with SLR x 10 each; Sidelying hip circles CW/CCW x 10 each bilaterally, with cues to prevent rolling onto back; Red tband resisted hip abduction and extension bilaterally 2 x 10 each; Table plank with alternating shoulder taps 2 x 10 each; Bilateral lateral step ups to 6" step x 10;   Repeated outcome measures with patient, discussed plan of care, updated goals; Education regarding proper lifting mechanics from floor height; Pt requires intermittent rest breaks due to fatigue. Cues and education provided regarding proper form/technique with  Pt provided red, green, and blue latex-free therabands to use for home exercises.       [facility] PT Assessment - 09/22/16 0001      Observation/Other Assessments   Other Surveys  Other Surveys   Modified [place] 42%     Standardized Balance Assessment   Standardized Balance Assessment 10 meter  walk test   10 Meter Walk 8.9                             PT Education - 09/21/16 1637    Education provided Yes   Education Details HEP reinforced, pain management and graded exercise intensity. Decreased therapy frequency.    Person(s) Educated Patient   Methods Explanation   Comprehension Verbalized understanding             PT Long Term Goals - 09/21/16 1717      PT LONG TERM GOAL #1   Title Patient will report a modified ODI score of less than 30% disability to demonstrate improved tolerance for ADLs.     Baseline 60%, 09/21/16: 42%   Time 8   Period Weeks   Status Not Met     PT LONG TERM GOAL #2   Title Patient will complete 68mwalk time in less than 10 seconds to return to community mobility.    Baseline >15 seconds , 09/21/16: 8.9 seconds   Time 8   Period Weeks   Status Achieved     PT LONG TERM GOAL #3   Title Patient will be able to lift 50# off the floor with an increase in pain no more than 1/10 from baseline to return to work related activities.    Baseline Unable, 09/21/16: Unable to comfortably lift 50#, demonstrates good body mechanics while lifting 20# from 6" step   Time 8   Period Weeks   Status On-going     PT LONG TERM GOAL #4   Title Patient will be able to stand for at least 1 hour with minimal increase in pain to complete work related activities.    Baseline 09/21/16: 15 minutes   Time 8   Period Weeks   Status On-going               Plan - 09/22/16 1233    Clinical Impression Statement Pt demonstrates significant reduction in her modified ODI score since starting therapy. However she remains limited in standing tolerance to approximately 15 minutes before pain starts to increase. She is also limited in the amount of weight she can lift from the floor. Pt unable to safely and comfortably lift 50# from the floor without increase in back pain which is part of her regular job requirement. Reinforced home exercise program with patient today. Decreased therapy freuqency to 1x/wk and will continue to work toward independence with home program in order to return to full function at work without pain.    Rehab Potential Good   PT Frequency 1x / week   PT Duration 4 weeks   PT Treatment/Interventions Aquatic Therapy;Cryotherapy;ELobbyistTherapeutic exercise;Therapeutic activities;Patient/family education;Manual techniques;Taping;Dry needling;Gait training;Stair training;Iontophoresis '4mg'$ /ml Dexamethasone;Moist Heat   PT Next Visit Plan  Assess progress at home. Decide whether to make additional appointments. Progress isometric gluteal work, avoid piriformis stretching, soft tissue mobilization over any tender areas, utilize IFC as indicated    PT Home Exercise Plan Tennis ball massage over tender area, sidelying clamshells with belt to create isometric work.    Consulted and Agree with Plan of Care Patient      Patient will benefit from skilled therapeutic intervention in order to improve the following deficits and impairments:  Abnormal gait, Pain, Decreased mobility, Increased muscle spasms, Impaired flexibility, Difficulty walking, Decreased balance, Decreased activity tolerance, Decreased endurance, Decreased strength  Visit Diagnosis: Chronic midline low back  pain without sciatica - Plan: PT plan of care cert/re-cert  Difficulty in walking, not elsewhere classified - Plan: PT plan of care cert/re-cert     Problem List Patient Active Problem List   Diagnosis Date Noted  . Back pain, chronic 07/06/2016  . Adiposity 07/06/2016  . Skin lesion 06/19/2016  . IBS (irritable bowel syndrome) 12/21/2015  . Environmental allergies 04/21/2015  . Arthritis 04/21/2015  . GERD (gastroesophageal reflux disease) 04/21/2015  . Type 2 diabetes mellitus ([place]) 01/20/2015   Phillips Grout PT, DPT   Miakoda Mcmillion 09/22/2016, 12:43 PM  [place] [facility] [address] [place], [address] Phone: [phone]   Fax:  [phone]  Name: Aarion Kittrell YYPEJYLTE MRN: [id number] Date of Birth: 10-Sep-1961

## 2016-09-26 ENCOUNTER — Ambulatory Visit: Payer: BLUE CROSS/BLUE SHIELD | Admitting: Physical Therapy

## 2016-09-28 ENCOUNTER — Ambulatory Visit: Payer: BLUE CROSS/BLUE SHIELD | Admitting: Physical Therapy

## 2016-10-03 ENCOUNTER — Encounter: Payer: BLUE CROSS/BLUE SHIELD | Admitting: Physical Therapy

## 2016-10-04 ENCOUNTER — Encounter: Payer: BLUE CROSS/BLUE SHIELD | Admitting: Physical Therapy

## 2016-10-05 ENCOUNTER — Other Ambulatory Visit: Payer: Self-pay | Admitting: Nurse Practitioner

## 2016-10-09 DIAGNOSIS — E119 Type 2 diabetes mellitus without complications: Secondary | ICD-10-CM | POA: Diagnosis not present

## 2016-10-09 DIAGNOSIS — H524 Presbyopia: Secondary | ICD-10-CM | POA: Diagnosis not present

## 2016-10-09 DIAGNOSIS — H5213 Myopia, bilateral: Secondary | ICD-10-CM | POA: Diagnosis not present

## 2016-10-09 DIAGNOSIS — H52223 Regular astigmatism, bilateral: Secondary | ICD-10-CM | POA: Diagnosis not present

## 2016-10-09 LAB — HM DIABETES EYE EXAM

## 2016-10-11 ENCOUNTER — Encounter: Payer: Self-pay | Admitting: Family Medicine

## 2016-11-16 DIAGNOSIS — D1801 Hemangioma of skin and subcutaneous tissue: Secondary | ICD-10-CM | POA: Diagnosis not present

## 2016-11-24 DIAGNOSIS — R300 Dysuria: Secondary | ICD-10-CM | POA: Diagnosis not present

## 2016-12-19 ENCOUNTER — Ambulatory Visit: Payer: BLUE CROSS/BLUE SHIELD | Admitting: Family Medicine

## 2016-12-20 ENCOUNTER — Ambulatory Visit: Payer: BLUE CROSS/BLUE SHIELD | Admitting: Family Medicine

## 2016-12-22 ENCOUNTER — Ambulatory Visit (INDEPENDENT_AMBULATORY_CARE_PROVIDER_SITE_OTHER): Payer: BLUE CROSS/BLUE SHIELD | Admitting: Family Medicine

## 2016-12-22 ENCOUNTER — Encounter: Payer: Self-pay | Admitting: Family Medicine

## 2016-12-22 VITALS — BP 137/78 | HR 88 | Temp 97.9°F | Resp 14 | Wt 238.8 lb

## 2016-12-22 DIAGNOSIS — E119 Type 2 diabetes mellitus without complications: Secondary | ICD-10-CM

## 2016-12-22 DIAGNOSIS — J329 Chronic sinusitis, unspecified: Secondary | ICD-10-CM | POA: Insufficient documentation

## 2016-12-22 DIAGNOSIS — J019 Acute sinusitis, unspecified: Secondary | ICD-10-CM | POA: Insufficient documentation

## 2016-12-22 DIAGNOSIS — K219 Gastro-esophageal reflux disease without esophagitis: Secondary | ICD-10-CM

## 2016-12-22 LAB — CBC
HCT: 40.8 % (ref 36.0–46.0)
Hemoglobin: 13.7 g/dL (ref 12.0–15.0)
MCHC: 33.6 g/dL (ref 30.0–36.0)
MCV: 90.3 fl (ref 78.0–100.0)
PLATELETS: 237 10*3/uL (ref 150.0–400.0)
RBC: 4.52 Mil/uL (ref 3.87–5.11)
RDW: 14.6 % (ref 11.5–15.5)
WBC: 7.3 10*3/uL (ref 4.0–10.5)

## 2016-12-22 LAB — LIPID PANEL
CHOL/HDL RATIO: 2
Cholesterol: 126 mg/dL (ref 0–200)
HDL: 55 mg/dL (ref >39.00)
LDL CALC: 55 mg/dL (ref 0–99)
NONHDL: 70.77
Triglycerides: 81 mg/dL (ref 0.0–149.0)
VLDL: 16.2 mg/dL (ref 0.0–40.0)

## 2016-12-22 LAB — COMPREHENSIVE METABOLIC PANEL
ALT: 20 U/L (ref 0–35)
AST: 21 U/L (ref 0–37)
Albumin: 4.3 g/dL (ref 3.5–5.2)
Alkaline Phosphatase: 72 U/L (ref 39–117)
BILIRUBIN TOTAL: 0.7 mg/dL (ref 0.2–1.2)
BUN: 14 mg/dL (ref 6–23)
CO2: 32 meq/L (ref 19–32)
Calcium: 9.7 mg/dL (ref 8.4–10.5)
Chloride: 102 mEq/L (ref 96–112)
Creatinine, Ser: 0.79 mg/dL (ref 0.40–1.20)
GFR: 79.78 mL/min (ref >60.00)
GLUCOSE: 155 mg/dL — AB (ref 70–99)
Potassium: 4 mEq/L (ref 3.5–5.1)
SODIUM: 139 meq/L (ref 135–145)
Total Protein: 7.5 g/dL (ref 6.0–8.3)

## 2016-12-22 LAB — HEMOGLOBIN A1C: Hgb A1c MFr Bld: 6.1 % (ref 4.6–6.5)

## 2016-12-22 MED ORDER — AMOXICILLIN-POT CLAVULANATE 875-125 MG PO TABS: 1.0000 | ORAL_TABLET | Freq: Two times a day (BID) | ORAL | 0 refills | Status: DC

## 2016-12-22 NOTE — Assessment & Plan Note (Signed)
Stable on Protonix.  Continue. 

## 2016-12-22 NOTE — Progress Notes (Signed)
Pre visit review using our clinic review tool, if applicable. No additional management support is needed unless otherwise documented below in the visit note. 

## 2016-12-22 NOTE — Assessment & Plan Note (Signed)
New acute problem. Likely viral. Advised continued use of flonase and sudafed. Augmentin to be fill if she worsens.

## 2016-12-22 NOTE — Progress Notes (Signed)
Subjective:  Patient ID: Brittany Santos, female    DOB: Feb 11, 1961  Age: 56 y.o. MRN: [id number]  CC: Follow up  HPI:  55 year old female with DM 2, GERD presents for follow up. Also complains of sinus issues (she is concerned about sinusitis).  DM  Blood sugars readings - 120's - 130's.  Hypoglycemia - No.  Medications - Metformin.  Adverse effects - No.  Compliance - Yes.  GERD  Stable on Protonix. No longer taking Carafate.  ? Sinusitis  Patient reports that she's been sick for the past week.  She's had congestion, sinus pressure. She reports mucus production from the nose. She's also had postnasal drip.  No fevers or chills.  No reports of purulent nasal discharge.  She has been using Flonase and Sudafed with some improvement.  No known exacerbating factors.  No other associated symptoms.  No other complaints at this time.  Social Hx   Social History   Social History  . Marital status: Single    Spouse name: N/A  . Number of children: N/A  . Years of education: N/A   Social History Main Topics  . Smoking status: Never Smoker  . Smokeless tobacco: Never Used  . Alcohol use No  . Drug use: No  . Sexual activity: No   Other Topics Concern  . None   Social History Narrative   Works- [employer]    Mother lives with pt   No children    1 dog lives inside    Right handed    Caffeine- 1 piece chocolate occasionally    Associate degree   Enjoys- gardening and going to concerts    Review of Systems  Constitutional: Negative.   HENT: Positive for congestion, postnasal drip and sinus pressure.   Musculoskeletal: Positive for back pain.   Objective:  BP 137/78   Pulse 88   Temp 97.9 F (36.6 C) (Oral)   Resp 14   Wt 238 lb 12.8 oz (108.3 kg)   LMP  (LMP Unknown)   SpO2 98%   BMI 41.97 kg/m   BP/Weight 12/22/2016 07/12/3663 4/0/3474  Systolic BP 259 563 875  Diastolic BP 78 87 70  Wt. (Lbs) 238.8 - 225  BMI 41.97 - 39.52    Physical Exam  Constitutional: She is oriented to person, place, and time. She appears well-developed. No distress.  HENT:  Mouth/Throat: Oropharynx is clear and moist.  Normal TM's bilaterally.  Cardiovascular: Normal rate and regular rhythm.   Pulmonary/Chest: Effort normal and breath sounds normal.  Neurological: She is alert and oriented to person, place, and time.  Psychiatric: She has a normal mood and affect.  Vitals reviewed.   Lab Results  Component Value Date   WBC 10.1 10/21/2015   HGB 13.6 10/21/2015   HCT 41.3 10/21/2015   PLT 200 10/21/2015   GLUCOSE 148 (H) 12/21/2015   CHOL 135 06/19/2016   TRIG 69.0 06/19/2016   HDL 55.00 06/19/2016   LDLCALC 66 06/19/2016   ALT 14 12/21/2015   AST 16 12/21/2015   NA 138 12/21/2015   K 3.8 12/21/2015   CL 101 12/21/2015   CREATININE 0.68 12/21/2015   BUN 16 12/21/2015   CO2 32 12/21/2015   TSH 2.05 08/12/2015   INR 1.0 06/07/2009   HGBA1C 6.4 06/19/2016   MICROALBUR <0.7 06/19/2016    Assessment & Plan:   Problem List Items Addressed This Visit    Type 2 diabetes mellitus ([place]) -  Primary    Stable. At goal. A1C today. Continue metformin.       Relevant Orders   Lipid panel   Hemoglobin A1c   Sinusitis    New acute problem. Likely viral. Advised continued use of flonase and sudafed. Augmentin to be fill if she worsens.      GERD (gastroesophageal reflux disease)    Stable on Protonix. Continue.      Relevant Orders   CBC   Comp Met (CMET)     Meds ordered this encounter  Medications  . DISCONTD: amoxicillin-clavulanate (AUGMENTIN) 875-125 MG tablet    Sig: Take 1 tablet by mouth 2 (two) times daily.    Dispense:  20 tablet    Refill:  0  . diclofenac (VOLTAREN) 75 MG EC tablet  . Calcium-Magnesium-Vitamin D (CALCIUM 500 PO)    Sig: Take by mouth.   Follow-up: 6 months  [place] DO [facility]

## 2016-12-22 NOTE — Patient Instructions (Signed)
We will call with your labs.  Continue your meds.  Continue the flonase and sudafed.  Follow up in 6 months.  Take care  Dr. Lacinda Axon

## 2016-12-22 NOTE — Assessment & Plan Note (Signed)
Stable. At goal. A1C today. Continue metformin.

## 2017-01-05 DIAGNOSIS — D2372 Other benign neoplasm of skin of left lower limb, including hip: Secondary | ICD-10-CM | POA: Diagnosis not present

## 2017-01-05 DIAGNOSIS — M21622 Bunionette of left foot: Secondary | ICD-10-CM | POA: Diagnosis not present

## 2017-01-05 DIAGNOSIS — M21962 Unspecified acquired deformity of left lower leg: Secondary | ICD-10-CM | POA: Diagnosis not present

## 2017-02-12 ENCOUNTER — Telehealth (INDEPENDENT_AMBULATORY_CARE_PROVIDER_SITE_OTHER): Payer: Self-pay | Admitting: Physical Medicine and Rehabilitation

## 2017-02-12 NOTE — Telephone Encounter (Signed)
Yes, chronic problem

## 2017-02-12 NOTE — Telephone Encounter (Signed)
Patient scheduled for 02/22/17 at 0915.

## 2017-02-22 ENCOUNTER — Ambulatory Visit (INDEPENDENT_AMBULATORY_CARE_PROVIDER_SITE_OTHER): Payer: BLUE CROSS/BLUE SHIELD | Admitting: Physical Medicine and Rehabilitation

## 2017-02-22 ENCOUNTER — Ambulatory Visit (INDEPENDENT_AMBULATORY_CARE_PROVIDER_SITE_OTHER): Payer: Self-pay

## 2017-02-22 ENCOUNTER — Encounter (INDEPENDENT_AMBULATORY_CARE_PROVIDER_SITE_OTHER): Payer: Self-pay | Admitting: Physical Medicine and Rehabilitation

## 2017-02-22 VITALS — BP 148/90 | HR 79

## 2017-02-22 DIAGNOSIS — M25551 Pain in right hip: Secondary | ICD-10-CM

## 2017-02-22 NOTE — Patient Instructions (Signed)

## 2017-02-22 NOTE — Progress Notes (Signed)
Brittany Santos ZOXWRUEAV - 56 y.o. female MRN [id number]  Date of birth: 01-27-1961  Office Visit Note: Visit Date: 02/22/2017 PCP: Coral Spikes, DO Referred by: Coral Spikes, DO  Subjective: Chief Complaint  Patient presents with  . Right Hip - Pain   HPI: Brittany Santos is a pleasant 56 year old female with chronic worsening history of right low back and right hip and groin and leg pain. Did well with last injection which was a right intra-articular hip injection for several months. Increased pain last several weeks. Into groin and down leg to foot. Worse with walking and standing. Shooting pain down leg at times. Her case is interesting in that she was initially followed by Dr. Alfonso Ramus with pain that seemed to be more of an L5 distribution radicular pain. MRI of the lumbar spine was performed which was fairly unrevealing other than just some degenerative changes and disc bulging. Spinal injections are really not beneficial at all. Because she was having groin pain as well we ended up completing a hip injection diagnostically and she did extremely well. This is confirm the diagnosis the most of her pain is coming from the hip joint. 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 We did discuss today the issue with hip surgery as well as other alternatives to injections. I would like to get her to have a consultation with Dr. Ninfa Linden in our office. She is agreeable to this but does not want to do this immediately.    ROS Otherwise per HPI.  Assessment & Plan: Visit Diagnoses:  1. Pain in right hip     Plan: Findings:  Diagnostic and therapeutic right intra-articular hip injection with fluoroscopic guidance. These have helped her tremendously in the past. The referral pattern does go past the knee into the foot it she does even comment on a little bit of numbness feeling. However with the hip injection she gets fairly complete resolution of  her symptoms and this is well documented. I do want her to see Dr. Ninfa Linden in our office for consultation of her hip at some point. She is agreeable of this but does not want to schedule that immediately. I do see referral patterns that seemed to be more radicular past the knee with hip problems at times. I think this may be some enervation of the joint capsule with the sciatic nerve posteriorly.     Meds & Orders: No orders of the defined types were placed in this encounter.   Orders Placed This Encounter  Procedures  . Large Joint Injection/Arthrocentesis  . XR C-ARM NO REPORT    Follow-up: Return if symptoms worsen or fail to improve.   Procedures: Large Joint Inj Date/Time: 02/22/2017 9:45 AM Performed by: Magnus Sinning Authorized by: Magnus Sinning   Consent Given by:  Patient Site marked: the procedure site was marked   Timeout: prior to procedure the correct patient, procedure, and site was verified   Indications:  Pain and diagnostic evaluation Location:  Hip Site:  R hip joint Prep: patient was prepped and draped in usual sterile fashion   Needle Size:  22 G Needle Length:  3.5 inches Approach:  Anterior Ultrasound Guidance: No   Fluoroscopic Guidance: Yes   Arthrogram: No   Medications:  80 mg triamcinolone acetonide 40 MG/ML; 3 mL bupivacaine 0.5 % Aspiration Attempted: Yes   Patient tolerance:  Patient tolerated the procedure well with no immediate complications  There was  excellent flow of contrast producing a partial arthrogram of the hip. The patient did have relief of her symptoms during the anesthetic phase of the injection.    No notes on file   Clinical History: No specialty comments available.  She reports that she has never smoked. She has never used smokeless tobacco.   Recent Labs  06/19/16 0833 12/22/16 0823  HGBA1C 6.4 6.1    Objective:  VS:  HT:    WT:   BMI:     BP:(!) 148/90  HR:79bpm  TEMP: ( )  RESP:  Physical Exam    Musculoskeletal:  Painful range of motion of the right hip particularly with groin pain with internal rotation. She has good distal strength.    Ortho Exam Imaging: Xr C-arm No Report  Result Date: 02/22/2017 Please see Notes or Procedures tab for imaging impression.   Past Medical/Family/Surgical/Social History: Medications & Allergies reviewed per EMR Patient Active Problem List   Diagnosis Date Noted  . Sinusitis 12/22/2016  . Back pain, chronic 07/06/2016  . Skin lesion 06/19/2016  . IBS (irritable bowel syndrome) 12/21/2015  . Arthritis 04/21/2015  . GERD (gastroesophageal reflux disease) 04/21/2015  . Type 2 diabetes mellitus ([place]) 01/20/2015   Past Medical History:  Diagnosis Date  . Allergy   . Arthritis   . Diabetes mellitus without complication ([place])   . GERD (gastroesophageal reflux disease)   . History of colon polyps   . History of diverticulitis   . History of kidney stones 04/15/15   Family History  Problem Relation Age of Onset  . Hypertension Brother   . Diabetes Neg Hx   . Breast cancer Mother   . Cancer Sister 62    Blood cancer (unknown)  . Cancer Maternal Aunt     Lung  . Cancer Maternal Uncle 30    kidney  . Heart attack Paternal Uncle   . Aneurysm Paternal Grandmother    Past Surgical History:  Procedure Laterality Date  . BILATERAL CARPAL TUNNEL RELEASE    . BREAST SURGERY    . cervical neck fusion    . CHOLECYSTECTOMY    . ESOPHAGOGASTRODUODENOSCOPY (EGD) WITH PROPOFOL N/A 05/12/2016   Procedure: ESOPHAGOGASTRODUODENOSCOPY (EGD) WITH PROPOFOL;  Surgeon: Lollie Sails, MD;  [facility] END[facility]COPY;  Service: Endoscopy;  Laterality: N/A;   Social History   Occupational History  . Not on file.   Social History Main Topics  . Smoking status: Never Smoker  . Smokeless tobacco: Never Used  . Alcohol use No  . Drug use: No  . Sexual activity: No

## 2017-02-23 MED ORDER — BUPIVACAINE HCL 0.5 % IJ SOLN
3.0000 mL | INTRAMUSCULAR | Status: AC | PRN
  Administered 2017-02-22: 3 mL via INTRA_ARTICULAR

## 2017-02-23 MED ORDER — TRIAMCINOLONE ACETONIDE 40 MG/ML IJ SUSP
80.0000 mg | INTRAMUSCULAR | Status: AC | PRN
  Administered 2017-02-22: 80 mg via INTRA_ARTICULAR

## 2017-04-05 ENCOUNTER — Other Ambulatory Visit: Payer: Self-pay | Admitting: Nurse Practitioner

## 2017-04-05 DIAGNOSIS — S93601A Unspecified sprain of right foot, initial encounter: Secondary | ICD-10-CM | POA: Diagnosis not present

## 2017-06-27 ENCOUNTER — Ambulatory Visit: Payer: BLUE CROSS/BLUE SHIELD | Admitting: Family Medicine

## 2017-07-18 ENCOUNTER — Telehealth (INDEPENDENT_AMBULATORY_CARE_PROVIDER_SITE_OTHER): Payer: Self-pay

## 2017-07-18 NOTE — Telephone Encounter (Signed)
Left vm asking if 07/26/17 @ 1:45 ok?

## 2017-07-18 NOTE — Telephone Encounter (Signed)
Pt called back. Scheduled her for 07/26/17 @ 1:45.

## 2017-07-18 NOTE — Telephone Encounter (Signed)
Patient called requesting another right hip injection. Had one on 02/22/17. Ok to repeat?

## 2017-07-18 NOTE — Telephone Encounter (Signed)
ok 

## 2017-07-23 ENCOUNTER — Ambulatory Visit (INDEPENDENT_AMBULATORY_CARE_PROVIDER_SITE_OTHER): Payer: BLUE CROSS/BLUE SHIELD | Admitting: Family Medicine

## 2017-07-23 ENCOUNTER — Encounter: Payer: Self-pay | Admitting: Family Medicine

## 2017-07-23 VITALS — BP 128/88 | HR 87 | Temp 97.4°F | Resp 16 | Wt 240.0 lb

## 2017-07-23 DIAGNOSIS — E119 Type 2 diabetes mellitus without complications: Secondary | ICD-10-CM

## 2017-07-23 DIAGNOSIS — Z1231 Encounter for screening mammogram for malignant neoplasm of breast: Secondary | ICD-10-CM | POA: Diagnosis not present

## 2017-07-23 LAB — MICROALBUMIN / CREATININE URINE RATIO
CREATININE, U: 107.1 mg/dL
MICROALB/CREAT RATIO: 0.7 mg/g (ref 0.0–30.0)

## 2017-07-23 LAB — HEMOGLOBIN A1C: Hgb A1c MFr Bld: 6.8 % — ABNORMAL HIGH (ref 4.6–6.5)

## 2017-07-23 NOTE — Patient Instructions (Signed)
Continue your medications.  We will call with the lab results.  Follow up in 6 months.   Take care  Dr. Lacinda Axon

## 2017-07-23 NOTE — Assessment & Plan Note (Addendum)
Stable, at goal. Continue metformin.  Foot exam performed today. A1c and microalbumin today as well.

## 2017-07-23 NOTE — Progress Notes (Signed)
Subjective:  Patient ID: Brittany Santos BJYNWGNFA, female    DOB: November 21, 1961  Age: 56 y.o. MRN: [id number]  CC: Follow up  HPI:  56 year old female with GERD, IBS, DM 2 presents for follow-up.  DM 2  Stable on metformin. At goal. Sugars less than 150.  Tolerating metformin. No side effects.  Needs foot exam and microalbumin today as well as hemoglobin A1c.  Social Hx   Social History   Social History  . Marital status: Single    Spouse name: N/A  . Number of children: N/A  . Years of education: N/A   Social History Main Topics  . Smoking status: Never Smoker  . Smokeless tobacco: Never Used  . Alcohol use No  . Drug use: No  . Sexual activity: No   Other Topics Concern  . None   Social History Narrative   Works- [employer]    Mother lives with pt   No children    1 dog lives inside    Right handed    Caffeine- 1 piece chocolate occasionally    Associate degree   Enjoys- gardening and going to concerts     Review of Systems  Constitutional: Negative.   Musculoskeletal:       Knee pain.   Objective:  BP 128/88 (BP Location: Left Arm, Patient Position: Sitting, Cuff Size: Large)   Pulse 87   Temp (!) 97.4 F (36.3 C) (Oral)   Resp 16   Wt 240 lb (108.9 kg)   LMP  (LMP Unknown) Comment: Patient states she had her "uterus scraped" sometime in the past 10 years.   SpO2 97%   BMI 42.18 kg/m   BP/Weight 07/23/2017 03/23/9628 04/18/8412  Systolic BP 244 010 272  Diastolic BP 88 90 78  Wt. (Lbs) 240 - 238.8  BMI 42.18 - 41.97   Physical Exam  Constitutional: She is oriented to person, place, and time. She appears well-developed. No distress.  Cardiovascular: Normal rate and regular rhythm.   No murmur heard. Pulmonary/Chest: Effort normal. She has no wheezes. She has no rales.  Neurological: She is alert and oriented to person, place, and time.  Psychiatric: She has a normal mood and affect.  Vitals reviewed.  Lab Results  Component Value Date   WBC 7.3 12/22/2016   HGB 13.7 12/22/2016   HCT 40.8 12/22/2016   PLT 237.0 12/22/2016   GLUCOSE 155 (H) 12/22/2016   CHOL 126 12/22/2016   TRIG 81.0 12/22/2016   HDL 55.00 12/22/2016   LDLCALC 55 12/22/2016   ALT 20 12/22/2016   AST 21 12/22/2016   NA 139 12/22/2016   K 4.0 12/22/2016   CL 102 12/22/2016   CREATININE 0.79 12/22/2016   BUN 14 12/22/2016   CO2 32 12/22/2016   TSH 2.05 08/12/2015   INR 1.0 06/07/2009   HGBA1C 6.1 12/22/2016   MICROALBUR <0.7 06/19/2016    Assessment & Plan:   Problem List Items Addressed This Visit    Type 2 diabetes mellitus ([place]) - Primary    Stable, at goal. Continue metformin.  Foot exam performed today. A1c and microalbumin today as well.      Relevant Orders   Hemoglobin A1c   Microalbumin / creatinine urine ratio    Other Visit Diagnoses    Screening mammogram, encounter for       Relevant Orders   MM DIAG BREAST TOMO BILATERAL     Follow-up: 6 months.  [place]  [place]  

## 2017-07-26 ENCOUNTER — Ambulatory Visit (INDEPENDENT_AMBULATORY_CARE_PROVIDER_SITE_OTHER): Payer: BLUE CROSS/BLUE SHIELD | Admitting: Physical Medicine and Rehabilitation

## 2017-07-26 ENCOUNTER — Ambulatory Visit (INDEPENDENT_AMBULATORY_CARE_PROVIDER_SITE_OTHER): Payer: BLUE CROSS/BLUE SHIELD

## 2017-07-26 ENCOUNTER — Encounter (INDEPENDENT_AMBULATORY_CARE_PROVIDER_SITE_OTHER): Payer: Self-pay | Admitting: Physical Medicine and Rehabilitation

## 2017-07-26 DIAGNOSIS — M25551 Pain in right hip: Secondary | ICD-10-CM

## 2017-07-26 NOTE — Progress Notes (Deleted)
Right hip and groin pain down leg to foot. Tightness in foot. States she did well with last injection for over 4 months and then gradual increase in pain for the past 2 to 3 weeks.

## 2017-07-26 NOTE — Patient Instructions (Signed)

## 2017-07-26 NOTE — Progress Notes (Signed)
Brittany Santos [id number] - 56 y.o. female MRN [id number]  Date of birth: 1961/03/29  Office Visit Note: Visit Date: 07/26/2017 PCP: Brittany Spikes, DO Referred by: Brittany Spikes, DO  Subjective: Chief Complaint  Patient presents with  . Right Hip - Pain   HPI: Brittany Santos is a 56 year old female that I last saw in March of this past year and completed a right intra-articular hip injection. She gets approximately 3 of these injections per year with actually very good relief of her hip and groin pain. She has some atypical referral pattern pain that's more of an L5 distribution but it does seemingly get much better after hip injection intra-articularly. She's had no real new issues recently. She reports gradual increase his symptoms over the last 2-3 weeks and becoming quite severe.    ROS Otherwise per HPI.  Assessment & Plan: Visit Diagnoses:  1. Pain in right hip     Plan: Findings:  Diagnostic and therapeutic right intra-articular hip injection with fluoroscopic guidance. Patient did have relief in anesthetic phase of the injection.    Meds & Orders: No orders of the defined types were placed in this encounter.   Orders Placed This Encounter  Procedures  . Large Joint Injection/Arthrocentesis  . XR C-ARM NO REPORT    Follow-up: Return if symptoms worsen or fail to improve.   Procedures: Intra-articular hip injection fluoroscopic guidance Date/Time: 07/26/2017 2:10 PM Performed by: Magnus Sinning Authorized by: Magnus Sinning   Consent Given by:  Patient Site marked: the procedure site was marked   Timeout: prior to procedure the correct patient, procedure, and site was verified   Indications:  Pain and diagnostic evaluation Location:  Hip Site:  R hip joint Prep: patient was prepped and draped in usual sterile fashion   Needle Size:  22 G Needle Length:  3.5 inches Approach:  Anterior Ultrasound Guidance: No   Fluoroscopic Guidance: Yes   Arthrogram: No   Medications:   3 mL bupivacaine 0.5 %; 80 mg triamcinolone acetonide 40 MG/ML Aspiration Attempted: Yes   Patient tolerance:  Patient tolerated the procedure well with no immediate complications  There was excellent flow of contrast producing a partial arthrogram of the hip. The patient did have relief of symptoms during the anesthetic phase of the injection.     No notes on file   Clinical History: No specialty comments available.  She reports that she has never smoked. She has never used smokeless tobacco.   Recent Labs  12/22/16 0823 07/23/17 0818  HGBA1C 6.1 6.8*    Objective:  VS:  HT:    WT:   BMI:     BP:   HR: bpm  TEMP: ( )  RESP:  Physical Exam  Musculoskeletal:  Patient ambulates without aid. She has good distal strength. She has pain with hip rotation. She has no pain over the greater trochanter. She has a negative slump test.    Ortho Exam Imaging: No results found.  Past Medical/Family/Surgical/Social History: Medications & Allergies reviewed per EMR Patient Active Problem List   Diagnosis Date Noted  . Back pain, chronic 07/06/2016  . IBS (irritable bowel syndrome) 12/21/2015  . GERD (gastroesophageal reflux disease) 04/21/2015  . Type 2 diabetes mellitus ([place]) 01/20/2015   Past Medical History:  Diagnosis Date  . Allergy   . Arthritis   . Diabetes mellitus without complication ([place])   . GERD (gastroesophageal reflux disease)   . History of colon polyps   . History of  diverticulitis   . History of kidney stones 04/15/15   Family History  Problem Relation Age of Onset  . Hypertension Brother   . Diabetes Neg Hx   . Breast cancer Mother   . Cancer Sister 76       Blood cancer (unknown)  . Cancer Maternal Aunt        Lung  . Cancer Maternal Uncle 30       kidney  . Heart attack Paternal Uncle   . Aneurysm Paternal Grandmother    Past Surgical History:  Procedure Laterality Date  . BILATERAL CARPAL TUNNEL RELEASE    . BREAST SURGERY    . cervical  neck fusion    . CHOLECYSTECTOMY    . ESOPHAGOGASTRODUODENOSCOPY (EGD) WITH PROPOFOL N/A 05/12/2016   Procedure: ESOPHAGOGASTRODUODENOSCOPY (EGD) WITH PROPOFOL;  Surgeon: Lollie Sails, MD;  Location: [facility] ENDOSCOPY;  Service: Endoscopy;  Laterality: N/A;   Social History   Occupational History  . Not on file.   Social History Main Topics  . Smoking status: Never Smoker  . Smokeless tobacco: Never Used  . Alcohol use No  . Drug use: No  . Sexual activity: No

## 2017-07-26 NOTE — Progress Notes (Unsigned)
Fluoro Time: 23 sec MGy: 23.73

## 2017-07-30 MED ORDER — BUPIVACAINE HCL 0.5 % IJ SOLN
3.0000 mL | INTRAMUSCULAR | Status: AC | PRN
  Administered 2017-07-26: 3 mL via INTRA_ARTICULAR

## 2017-07-30 MED ORDER — TRIAMCINOLONE ACETONIDE 40 MG/ML IJ SUSP
80.0000 mg | INTRAMUSCULAR | Status: AC | PRN
  Administered 2017-07-26: 80 mg via INTRA_ARTICULAR

## 2017-08-06 DIAGNOSIS — K219 Gastro-esophageal reflux disease without esophagitis: Secondary | ICD-10-CM | POA: Diagnosis not present

## 2017-08-06 DIAGNOSIS — Z86010 Personal history of colonic polyps: Secondary | ICD-10-CM | POA: Diagnosis not present

## 2017-08-23 DIAGNOSIS — L0211 Cutaneous abscess of neck: Secondary | ICD-10-CM | POA: Diagnosis not present

## 2017-08-23 DIAGNOSIS — L03221 Cellulitis of neck: Secondary | ICD-10-CM | POA: Diagnosis not present

## 2017-08-24 DIAGNOSIS — Z1231 Encounter for screening mammogram for malignant neoplasm of breast: Secondary | ICD-10-CM | POA: Diagnosis not present

## 2017-08-24 LAB — HM MAMMOGRAPHY

## 2017-09-28 ENCOUNTER — Ambulatory Visit (INDEPENDENT_AMBULATORY_CARE_PROVIDER_SITE_OTHER): Payer: BLUE CROSS/BLUE SHIELD | Admitting: Maternal Newborn

## 2017-09-28 ENCOUNTER — Encounter: Payer: Self-pay | Admitting: Maternal Newborn

## 2017-09-28 VITALS — BP 130/90 | HR 101 | Ht 64.5 in | Wt 240.0 lb

## 2017-09-28 DIAGNOSIS — Z01419 Encounter for gynecological examination (general) (routine) without abnormal findings: Secondary | ICD-10-CM | POA: Diagnosis not present

## 2017-09-28 DIAGNOSIS — Z124 Encounter for screening for malignant neoplasm of cervix: Secondary | ICD-10-CM | POA: Diagnosis not present

## 2017-09-28 NOTE — Progress Notes (Signed)
Gynecology Annual Exam  PCP: Coral Spikes, DO  Chief Complaint:  Chief Complaint  Patient presents with  . Gynecologic Exam    History of Present Illness:Patient is a 56 y.o. G0P0000 presents for annual exam. The patient has no complaints today.   LMP: No LMP recorded (lmp unknown). Patient is postmenopausal. She is not having any bleeding. Postcoital Bleeding: not applicable Dysmenorrhea: not applicable   The patient is not currently sexually active. She denies dyspareunia.  The patient occasionally performs self breast exams.  There is notable family history of breast or ovarian cancer in her family.  The patient wears seatbelts: yes.   The patient has regular exercise: no.    The patient denies current symptoms of depression.     Review of Systems: Review of Systems  Constitutional: Negative for fever, malaise/fatigue and weight loss.  HENT: Positive for congestion. Negative for hearing loss and sinus pain.        With seasonal allergies  Eyes: Negative.   Respiratory: Positive for cough. Negative for shortness of breath and wheezing.        With seasonal allergies  Cardiovascular: Negative for chest pain and palpitations.  Gastrointestinal: Negative for abdominal pain, constipation and nausea.  Genitourinary: Negative for dysuria, flank pain and frequency.  Musculoskeletal: Positive for back pain and joint pain. Negative for myalgias and neck pain.  Neurological: Negative for dizziness, tingling, sensory change and headaches.  Endo/Heme/Allergies: Negative.   Psychiatric/Behavioral: Negative for depression. The patient is not nervous/anxious and does not have insomnia.   All other systems reviewed and are negative.   Past Medical History:  Past Medical History:  Diagnosis Date  . Allergy   . Arthritis   . Diabetes mellitus without complication ([place])   . GERD (gastroesophageal reflux disease)   . History of colon polyps   . History of diverticulitis   .  History of kidney stones 04/15/15    Past Surgical History:  Past Surgical History:  Procedure Laterality Date  . BILATERAL CARPAL TUNNEL RELEASE    . BREAST SURGERY    . cervical neck fusion    . CHOLECYSTECTOMY    . ESOPHAGOGASTRODUODENOSCOPY (EGD) WITH PROPOFOL N/A 05/12/2016   Procedure: ESOPHAGOGASTRODUODENOSCOPY (EGD) WITH PROPOFOL;  Surgeon: Lollie Sails, MD;  Location: [facility] ENDOSCOPY;  Service: Endoscopy;  Laterality: N/A;    Gynecologic History:  No LMP recorded (lmp unknown). Patient is postmenopausal. Last Pap: 2015 Results were:  no abnormalities. Last mammogram: 08/24/2017. Results were: BI-RAD I. Obstetric History: G0P0000  Family History:  Family History  Problem Relation Age of Onset  . Hypertension Brother   . Breast cancer Mother   . Cancer Sister 28       Blood cancer (unknown)  . Cancer Maternal Aunt        Lung  . Cancer Maternal Uncle 30       kidney  . Heart attack Paternal Uncle   . Aneurysm Paternal Grandmother   . Diabetes Neg Hx     Social History:  Social History   Social History  . Marital status: Single    Spouse name: N/A  . Number of children: N/A  . Years of education: N/A   Occupational History  . Not on file.   Social History Main Topics  . Smoking status: Never Smoker  . Smokeless tobacco: Never Used  . Alcohol use No  . Drug use: No  . Sexual activity: No   Other Topics Concern  .  Not on file   Social History Narrative   Works- [employer]    Mother lives with pt   No children    1 dog lives inside    Right handed    Caffeine- 1 piece chocolate occasionally    Associate degree   Enjoys- gardening and going to concerts     Allergies:  Allergies  Allergen Reactions  . Vicodin [Hydrocodone-Acetaminophen] Itching and Rash    Medications: Prior to Admission medications   Medication Sig Start Date End Date Taking? Authorizing Provider  BIOTIN PO Take 1 tablet by mouth daily.    [provider]  Calcium-Magnesium-Vitamin D (CALCIUM 500 PO) Take by mouth.    [provider]  cetirizine (ZYRTEC) 10 MG tablet Take 10 mg by mouth daily.    [provider]  diclofenac (VOLTAREN) 75 MG EC tablet  11/13/16   [provider]  glucose blood test strip Use as instructed Dx: E11.9 09/29/15   Rubbie Battiest, RN  Lancet Devices (ONE TOUCH DELICA LANCING DEV) MISC Use as directed Dx: E11.9 09/29/15   Rubbie Battiest, RN  metFORMIN (GLUCOPHAGE) 500 MG tablet TAKE 2 TABLETS (1,000MG ) ATBEDTIME 10/05/16   Coral Spikes, DO  Multiple Vitamin (MULTIVITAMIN) capsule Take 1 capsule by mouth daily.    [provider]  progesterone (PROMETRIUM) 100 MG capsule Take 100 mg by mouth daily. 07/26/15   Elgie Collard, MD  vitamin B-12 (CYANOCOBALAMIN) 100 MCG tablet Take 100 mcg by mouth daily.    [provider]    Physical Exam Vitals: Blood pressure 130/90, pulse (!) 101, height 5' 4.5" (1.638 m), weight 240 lb (108.9 kg).  General: NAD HEENT: normocephalic, anicteric Thyroid: no enlargement, no palpable nodules Pulmonary: No increased work of breathing, CTAB Cardiovascular: RRR, distal pulses 2+ Breast: Breasts symmetrical, no tenderness, no palpable nodules or masses, no skin or nipple retraction present, no nipple discharge.  No axillary or supraclavicular lymphadenopathy. Abdomen: NABS, obese, soft, non-tender, non-distended.  Umbilicus without lesions.  No hepatomegaly, splenomegaly or masses palpable. No evidence of hernia  Genitourinary:  External: Normal external female genitalia.  Normal urethral meatus, normal  Bartholin's and Skene's glands.    Vagina: Normal vaginal mucosa, no evidence of prolapse.    Cervix: Grossly normal in appearance, no bleeding  Uterus: Non-enlarged, mobile, normal contour.  No CMT  Adnexa: ovaries non-enlarged, no adnexal masses  Rectal: deferred  Lymphatic: no evidence of inguinal lymphadenopathy Extremities: no  edema, erythema, or tenderness Neurologic: Grossly intact Psychiatric: mood appropriate, affect full   Assessment: 56 y.o. G0P0000 routine annual exam.  Plan: Problem List Items Addressed This Visit   Visit Diagnoses    Encounter for annual routine gynecological examination    -  Primary   Pap smear for cervical cancer screening       Relevant Orders   Pap IG and HPV (high risk) DNA detection      1) Mammogram - recommend yearly screening mammogram.  Mammogram is up to date.  2) STI screening was offered and declined.  3) ASCCP guidelines and rationale discussed.  Patient opts for every 3 year screening interval.  4) Osteoporosis:  - per USPTF routine screening DEXA at age 56  5) Routine healthcare maintenance including cholesterol, diabetes screening discussed;managed by PCP.  6) Colonoscopy due in 2021.   7) Maternal history of breast (age 15)  and ovarian cancer (age 55). Discussed MyRisk genetic testing with patient. She is interested, but wants  to speak with insurance to see if it is covered before proceeding.  8) Follow up 1 year for routine annual.  Avel Sensor, CNM 09/28/2017  3:59 PM

## 2017-10-01 ENCOUNTER — Other Ambulatory Visit: Payer: Self-pay | Admitting: Maternal Newborn

## 2017-10-02 LAB — PAP IG AND HPV HIGH-RISK
HPV, high-risk: NEGATIVE
PAP SMEAR COMMENT: 0

## 2017-10-20 ENCOUNTER — Other Ambulatory Visit: Payer: Self-pay | Admitting: Family Medicine

## 2017-11-13 ENCOUNTER — Telehealth (INDEPENDENT_AMBULATORY_CARE_PROVIDER_SITE_OTHER): Payer: Self-pay | Admitting: Physical Medicine and Rehabilitation

## 2017-11-13 NOTE — Telephone Encounter (Signed)
Ok to repeat 

## 2017-11-14 NOTE — Telephone Encounter (Signed)
Scheduled for 11/23/17 at 0945.

## 2017-11-16 DIAGNOSIS — J019 Acute sinusitis, unspecified: Secondary | ICD-10-CM | POA: Diagnosis not present

## 2017-11-16 DIAGNOSIS — K121 Other forms of stomatitis: Secondary | ICD-10-CM | POA: Diagnosis not present

## 2017-11-16 DIAGNOSIS — B9689 Other specified bacterial agents as the cause of diseases classified elsewhere: Secondary | ICD-10-CM | POA: Diagnosis not present

## 2017-11-16 DIAGNOSIS — H66003 Acute suppurative otitis media without spontaneous rupture of ear drum, bilateral: Secondary | ICD-10-CM | POA: Diagnosis not present

## 2017-11-23 ENCOUNTER — Ambulatory Visit (INDEPENDENT_AMBULATORY_CARE_PROVIDER_SITE_OTHER): Payer: BLUE CROSS/BLUE SHIELD | Admitting: Physical Medicine and Rehabilitation

## 2017-11-23 ENCOUNTER — Ambulatory Visit (INDEPENDENT_AMBULATORY_CARE_PROVIDER_SITE_OTHER): Payer: BLUE CROSS/BLUE SHIELD

## 2017-11-23 ENCOUNTER — Encounter (INDEPENDENT_AMBULATORY_CARE_PROVIDER_SITE_OTHER): Payer: Self-pay | Admitting: Physical Medicine and Rehabilitation

## 2017-11-23 DIAGNOSIS — M25551 Pain in right hip: Secondary | ICD-10-CM | POA: Diagnosis not present

## 2017-11-23 NOTE — Progress Notes (Deleted)
Right hip pain. Radiating pain to right foot, right shoe feels tight. Worse pain 2 weeks. No injury.  No contrast allergy. No blood thinner. No driver.

## 2017-11-23 NOTE — Patient Instructions (Signed)

## 2017-11-23 NOTE — Progress Notes (Signed)
Brittany Santos [id number] - 56 y.o. female MRN [id number]  Date of birth: Oct 22, 1961  Office Visit Note: Visit Date: 11/23/2017 PCP: Coral Spikes, DO Referred by: Coral Spikes, DO  Subjective: Chief Complaint  Patient presents with  . Right Hip - Pain   HPI: Brittany Santos is a 56 year old that I know quite well followed by Dr. Alfonso Ramus case is always interesting in that she gets pretty classic L5 radicular complaints of the top of the foot.  Feels like she gets swelling or it feels swollen.  She has had workup from the standpoint of lumbar spine spine injections without relief.  Injections have given her significant relief of even the radicular complaints.  Hip is not really shown much in the way of pathology.  We will not had MRI arthrogram that I am aware of.  We discussed this at length today.  Last injection was in August and so she is done extremely well again.  Injections seem to last anywhere from 4-8 months.  She has had extensive physical therapy for her back pain.  She did have small L2 after fall in 2017.    ROS Otherwise per HPI.  Assessment & Plan: Visit Diagnoses:  1. Pain in right hip     Plan: Findings:  Intra-articular right hip injection with fluoroscopic guidance.  Patient did have relief during the anesthetic phase.  Depending on outcome would consider evaluation by Dr. Ninfa Linden in our office.    Meds & Orders: No orders of the defined types were placed in this encounter.   Orders Placed This Encounter  Procedures  . Large Joint Inj: R hip joint  . XR C-ARM NO REPORT    Follow-up: Return if symptoms worsen or fail to improve.   Procedures: Large Joint Inj: R hip joint on 11/23/2017 9:55 AM Indications: diagnostic evaluation and pain Details: 22 G 3.5 in needle, fluoroscopy-guided anterior approach  Arthrogram: No  Medications: 80 mg triamcinolone acetonide 40 MG/ML; 3 mL bupivacaine 0.5 % Outcome: tolerated well, no immediate complications  There was  excellent flow of contrast producing a partial arthrogram of the hip. The patient did have relief of symptoms during the anesthetic phase of the injection. Procedure, treatment alternatives, risks and benefits explained, specific risks discussed. Consent was given by the patient. Immediately prior to procedure a time out was called to verify the correct patient, procedure, equipment, support staff and site/side marked as required. Patient was prepped and draped in the usual sterile fashion.      No notes on file   Clinical History: MRI LUMBAR SPINE WITHOUT CONTRAST  TECHNIQUE: Multiplanar, multisequence MR imaging of the lumbar spine was performed. No intravenous contrast was administered.  COMPARISON: Lumbar MRI dated 04/28/2016.  FINDINGS: Segmentation: Standard.  Alignment: Physiologic.  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 There has been mild further interval loss of height of the L2 vertebral body. There is no edema within the vertebral body to suggest acute or subacute fracture. There is no significant retropulsion of bony elements. No new compression deformity is identified. Bone marrow signal is otherwise unremarkable. There are multilevel small Schmorl's nodes that are unchanged.  Conus medullaris: Extends to the L1-2 level and appears normal.  Paraspinal and other soft tissues: Negative.  Disc levels:  T12-L1: Small disc bulge without significant foraminal narrowing or canal stenosis.  L1-2: Small disc bulge without significant foraminal narrowing or canal stenosis.  L2-3: Moderate disc  bulge and mild ligamentum flavum hypertrophy with mild lateral recess and foraminal narrowing. No significant canal stenosis.  L3-4: Small disc bulge and mild facet and flavum hypertrophy with minimal bilateral foraminal narrowing. No significant canal stenosis.  L4-5: Small  disc bulge and mild facet and ligamentum flavum hypertrophy with minimal bilateral foraminal narrowing. No significant canal stenosis.  L5-S1: No significant disc displacement, foraminal narrowing, or canal stenosis.  IMPRESSION: 1. Mild interval loss of height of the L2 vertebral body, but no bone marrow edema to suggest acute or subacute fracture, likely interval change related to initial injury. No significant retropulsion of bony elements. 2. Mild lumbar spondylosis without significant interval change. No high-grade canal stenosis or foraminal narrowing.   Electronically Signed By: Kristine Garbe M.D. On: 07/06/2016 15:51  MRI PELVIS WITHOUT CONTRAST  TECHNIQUE: Multiplanar, multisequence MR imaging was performed. No intravenous contrast was administered.  COMPARISON:  None.  FINDINGS: Both hips are normally located. Mild to moderate hip joint degenerative changes bilaterally but no findings for stress fracture or avascular necrosis. No hip joint effusion. No periarticular fluid collections to suggest a paralabral cyst mild gluteus medius tendinitis bilaterally but no findings for trochanteric bursitis. The surrounding hip and pelvic musculature is otherwise unremarkable. There is mild bilateral hamstring tendinopathy noted.  The pubic symphysis and SI joints are intact. No pelvic fractures or bone lesions. No obvious lower lumbar disc protrusions or spinal stenosis.  No significant intrapelvic abnormalities are identified. Moderate sigmoid diverticulosis is noted. The uterus and ovaries appear normal. Nabothian cysts are noted. No inguinal mass or adenopathy.  IMPRESSION: Mild to moderate hip joint degenerative changes bilaterally but no stress fracture or AVN.  Mild gluteus medius tendinitis bilaterally and mild bilateral hamstring tendinopathy.   Electronically Signed   By: Kalman Jewels M.D.   On: 11/27/2013 10:12  She reports  that  has never smoked. she has never used smokeless tobacco.  Recent Labs    12/22/16 0823 07/23/17 0818  HGBA1C 6.1 6.8*    Objective:  VS:  HT:    WT:   BMI:     BP:   HR: bpm  TEMP: ( )  RESP:  Physical Exam  Musculoskeletal:  Negative slump test bilaterally is of hip rotation.    Ortho Exam Imaging: No results found.  Past Medical/Family/Surgical/Social History: Medications & Allergies reviewed per EMR Patient Active Problem List   Diagnosis Date Noted  . Back pain, chronic 07/06/2016  . IBS (irritable bowel syndrome) 12/21/2015  . GERD (gastroesophageal reflux disease) 04/21/2015  . Type 2 diabetes mellitus ([place]) 01/20/2015   Past Medical History:  Diagnosis Date  . Allergy   . Arthritis   . Diabetes mellitus without complication ([place])   . GERD (gastroesophageal reflux disease)   . History of colon polyps   . History of diverticulitis   . History of kidney stones 04/15/15   Family History  Problem Relation Age of Onset  . Hypertension Brother   . Breast cancer Mother   . Cancer Sister 78       Blood cancer (unknown)  . Cancer Maternal Aunt        Lung  . Cancer Maternal Uncle 30       kidney  . Heart attack Paternal Uncle   . Aneurysm Paternal Grandmother   . Diabetes Neg Hx    Past Surgical History:  Procedure Laterality Date  . BILATERAL CARPAL TUNNEL RELEASE    . BREAST SURGERY    . cervical neck  fusion    . CHOLECYSTECTOMY    . ESOPHAGOGASTRODUODENOSCOPY (EGD) WITH PROPOFOL N/A 05/12/2016   Procedure: ESOPHAGOGASTRODUODENOSCOPY (EGD) WITH PROPOFOL;  Surgeon: Lollie Sails, MD;  Location: [facility] ENDOSCOPY;  Service: Endoscopy;  Laterality: N/A;   Social History   Occupational History  . Not on file  Tobacco Use  . Smoking status: Never Smoker  . Smokeless tobacco: Never Used  Substance and Sexual Activity  . Alcohol use: No    Alcohol/week: 0.0 oz  . Drug use: No  . Sexual activity: No

## 2017-11-27 MED ORDER — TRIAMCINOLONE ACETONIDE 40 MG/ML IJ SUSP
80.0000 mg | INTRAMUSCULAR | Status: AC | PRN
  Administered 2017-11-23: 80 mg via INTRA_ARTICULAR

## 2017-11-27 MED ORDER — BUPIVACAINE HCL 0.5 % IJ SOLN
3.0000 mL | INTRAMUSCULAR | Status: AC | PRN
  Administered 2017-11-23: 3 mL via INTRA_ARTICULAR

## 2018-01-04 DIAGNOSIS — M7541 Impingement syndrome of right shoulder: Secondary | ICD-10-CM | POA: Diagnosis not present

## 2018-01-11 DIAGNOSIS — J019 Acute sinusitis, unspecified: Secondary | ICD-10-CM | POA: Diagnosis not present

## 2018-01-11 DIAGNOSIS — J0100 Acute maxillary sinusitis, unspecified: Secondary | ICD-10-CM | POA: Diagnosis not present

## 2018-01-11 DIAGNOSIS — R430 Anosmia: Secondary | ICD-10-CM | POA: Diagnosis not present

## 2018-01-19 IMAGING — RF DG ESOPHAGUS
8 of 12 series · 12 of 24 positions shown · non-contrast
Comparison: None.

CLINICAL DATA: Difficulty swallowing for 2-3 weeks

EXAM:
ESOPHOGRAM / BARIUM SWALLOW / BARIUM TABLET STUDY
TECHNIQUE: Combined double contrast and single contrast examination performed
using effervescent crystals, thick barium liquid, and thin barium
liquid. The patient was observed with fluoroscopy swallowing a 13 mm
barium sulphate tablet.
FLUOROSCOPY TIME:  Radiation Exposure Index (as provided by the
fluoroscopic device): 6.3 mGy

[Series 1: cp_standard · 0.51mm/px · 2 of 20 frames shown (1 of 8)]
[frame 11/20]
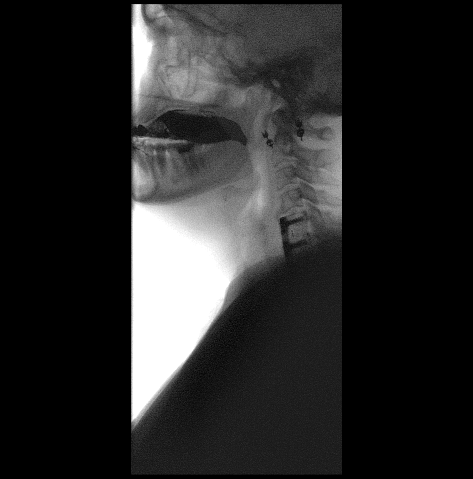
[frame 18/20]
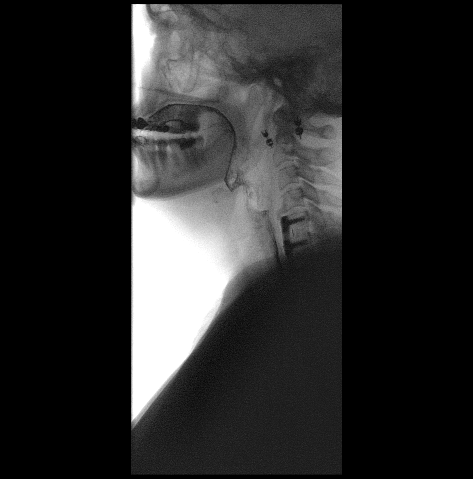

[Series 2: cp_standard · 0.51mm/px · 2 of 45 frames shown (2 of 8)]
[frame 21/45]
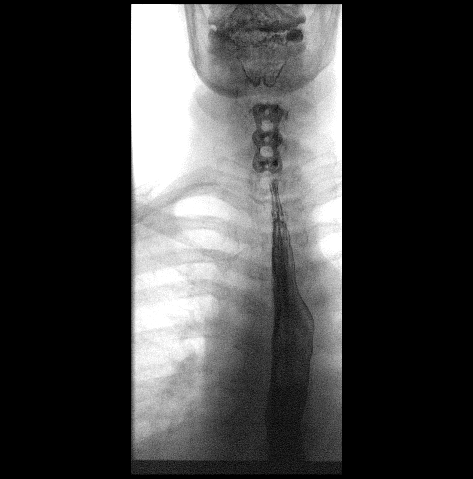
[frame 39/45]
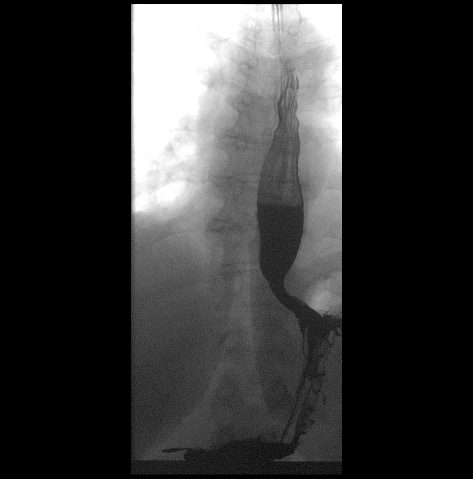

[Series 3: cp_standard · 0.51mm/px · 2 of 35 frames shown (3 of 8)]
[frame 6/35]
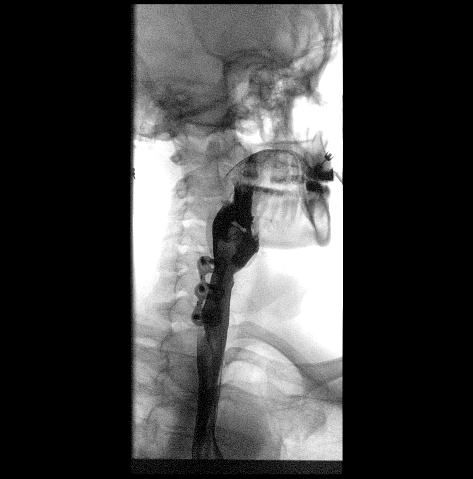
[frame 30/35]
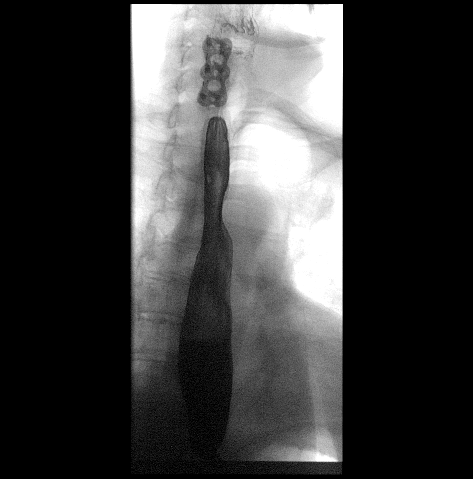

[Series 5: cp_standard · 0.51mm/px · 2 of 16 frames shown (4 of 8)]
[frame 3/16]
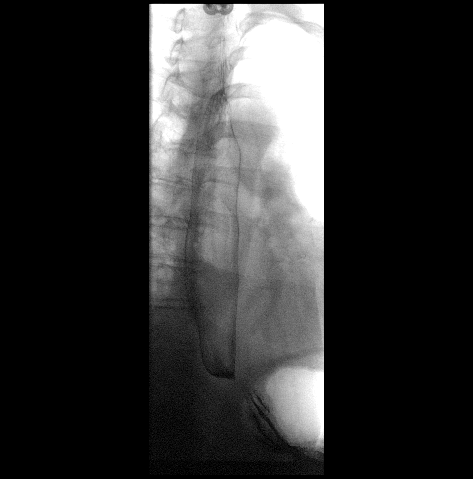
[frame 9/16]
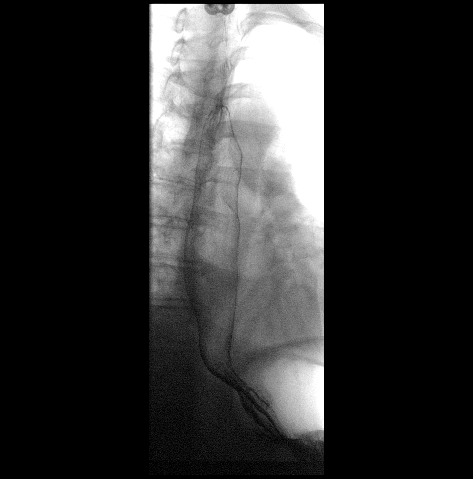

[Series 6: cp_standard · 0.27mm/px · 1 of 1 slices shown (5 of 8)]
[im 1/1]
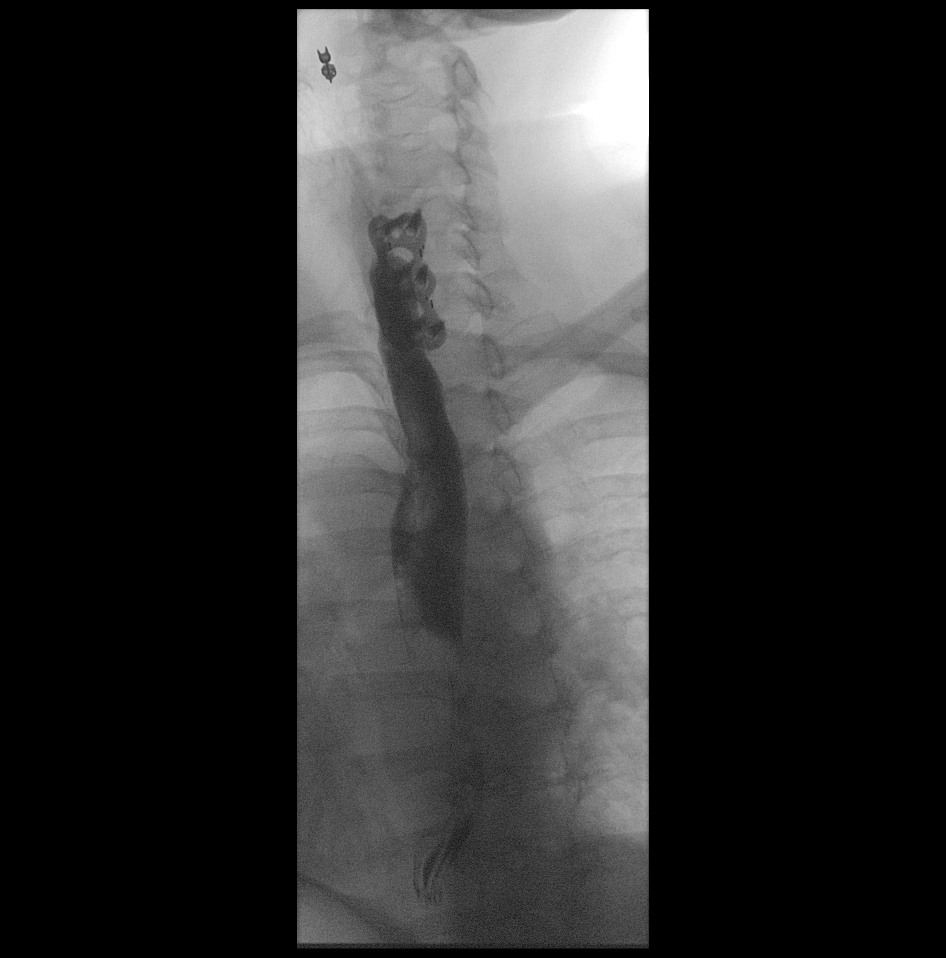

[Series 8: cp_standard · 0.27mm/px · 1 of 1 slices shown (6 of 8)]
[im 1/1]
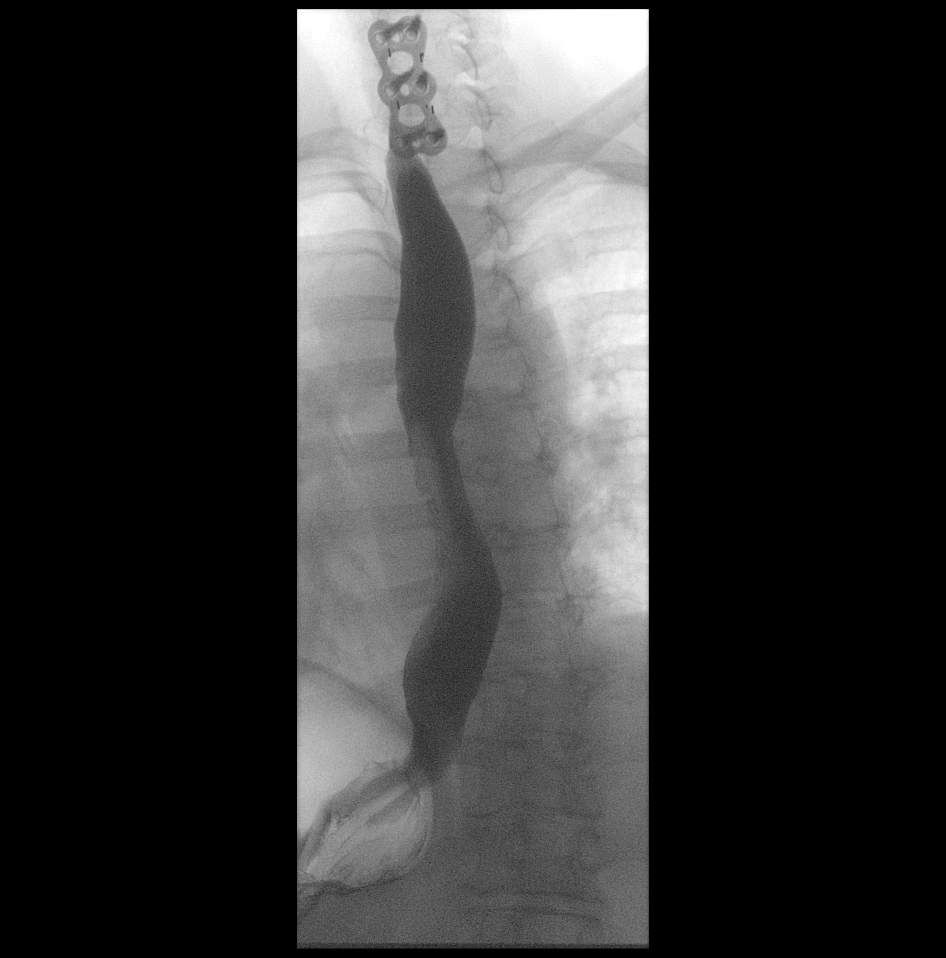

[Series 10: cp_standard · 0.27mm/px · 1 of 1 slices shown (7 of 8)]
[im 1/1]
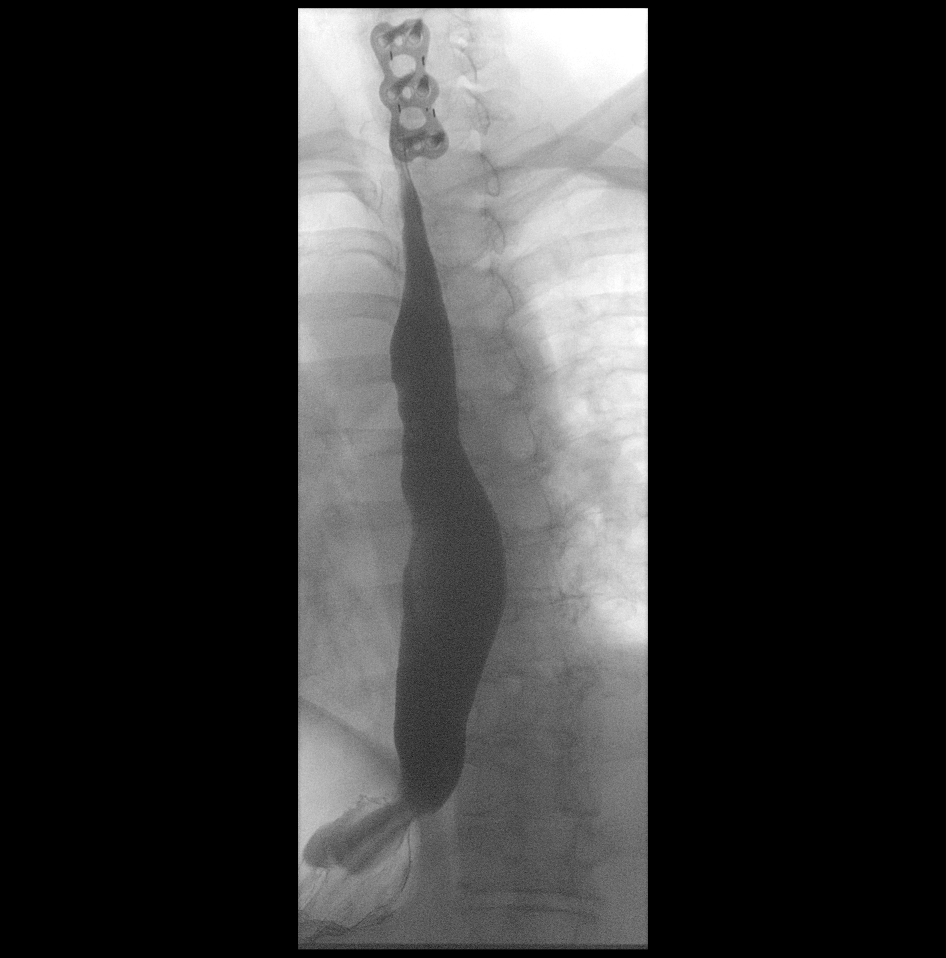

[Series 12: cp_standard · 0.26mm/px · 1 of 1 slices shown (8 of 8)]
[im 1/1]
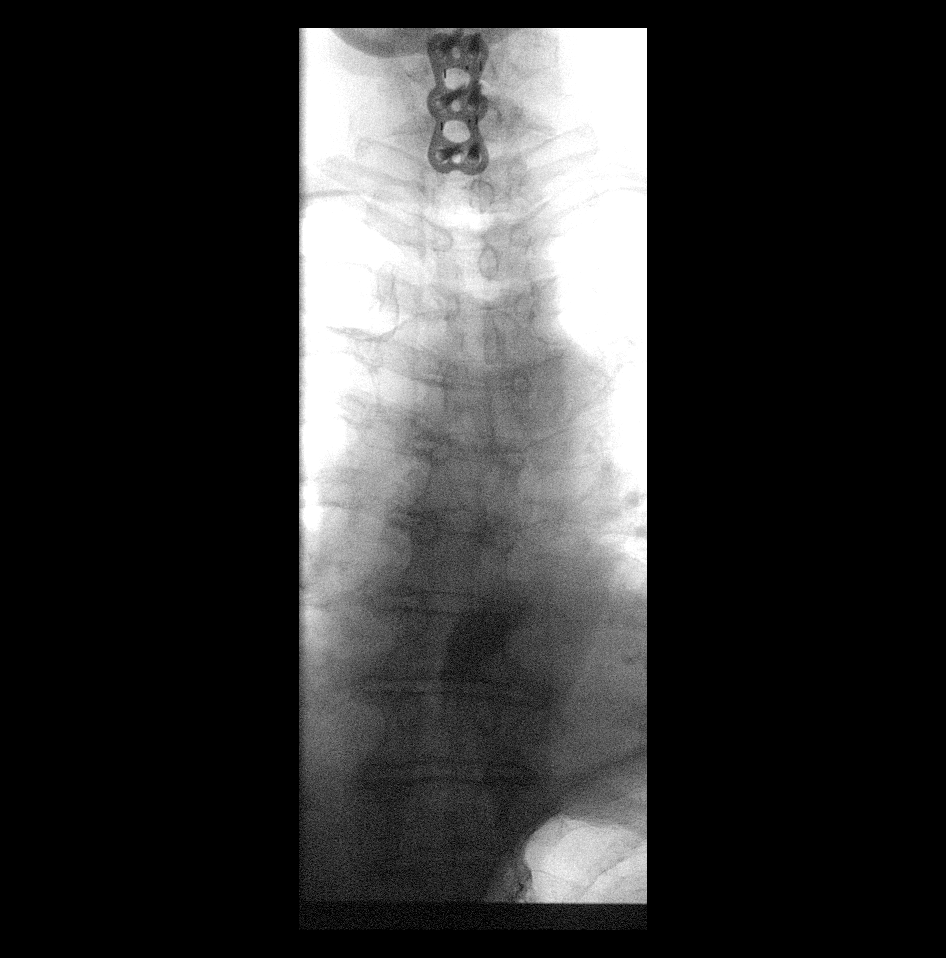

[12 of 24 positions shown; findings below may reference images not displayed]

FINDINGS: There was normal pharyngeal anatomy and motility. Contrast flowed
freely through the esophagus without evidence of stricture or mass.
There was normal esophageal mucosa without evidence of irregularity
or ulceration. Esophageal motility was normal. Moderate
gastroesophageal reflux. No definite hiatal hernia was demonstrated.

At the end of the examination a 13 mm barium tablet was administered
which transited through the esophagus and esophagogastric junction
without delay.
IMPRESSION: 1. Moderate gastroesophageal reflux.

## 2018-01-25 DIAGNOSIS — K219 Gastro-esophageal reflux disease without esophagitis: Secondary | ICD-10-CM | POA: Diagnosis not present

## 2018-01-25 DIAGNOSIS — J301 Allergic rhinitis due to pollen: Secondary | ICD-10-CM | POA: Diagnosis not present

## 2018-01-25 DIAGNOSIS — J320 Chronic maxillary sinusitis: Secondary | ICD-10-CM | POA: Diagnosis not present

## 2018-01-28 ENCOUNTER — Ambulatory Visit: Payer: BLUE CROSS/BLUE SHIELD | Admitting: Family Medicine

## 2018-03-05 ENCOUNTER — Telehealth: Payer: Self-pay | Admitting: Family Medicine

## 2018-03-05 DIAGNOSIS — J329 Chronic sinusitis, unspecified: Secondary | ICD-10-CM

## 2018-03-05 NOTE — Telephone Encounter (Signed)
[facility] sent a fax stating pt was called and left a msg for pt to call back and schedule. Pt did not call to sched appt.

## 2018-03-05 NOTE — Telephone Encounter (Signed)
The patient was previously a Dr. Lacinda Axon patient.  I do not see where we have referred her to ENT.  Please follow-up with her regarding this to see where the referral came from.  Thanks.

## 2018-03-11 ENCOUNTER — Telehealth (INDEPENDENT_AMBULATORY_CARE_PROVIDER_SITE_OTHER): Payer: Self-pay | Admitting: Physical Medicine and Rehabilitation

## 2018-03-11 NOTE — Telephone Encounter (Signed)
Please contact the patient and see if she knows why she was referred to ENT previously and see if she knows who referred her.  It does not appear this came from our office.  Thanks.

## 2018-03-11 NOTE — Telephone Encounter (Signed)
Yes ok 

## 2018-03-11 NOTE — Telephone Encounter (Signed)
Dr Caryl Bis I checked as well I donot see a referral in pt chart at all. Please advise?

## 2018-03-12 NOTE — Telephone Encounter (Signed)
Scheduled for 03/25/18 at 1530.

## 2018-03-14 ENCOUNTER — Ambulatory Visit (INDEPENDENT_AMBULATORY_CARE_PROVIDER_SITE_OTHER): Payer: BLUE CROSS/BLUE SHIELD

## 2018-03-14 ENCOUNTER — Ambulatory Visit (INDEPENDENT_AMBULATORY_CARE_PROVIDER_SITE_OTHER): Payer: BLUE CROSS/BLUE SHIELD | Admitting: Physical Medicine and Rehabilitation

## 2018-03-14 ENCOUNTER — Encounter (INDEPENDENT_AMBULATORY_CARE_PROVIDER_SITE_OTHER): Payer: Self-pay | Admitting: Physical Medicine and Rehabilitation

## 2018-03-14 DIAGNOSIS — M25551 Pain in right hip: Secondary | ICD-10-CM

## 2018-03-14 NOTE — Patient Instructions (Signed)

## 2018-03-14 NOTE — Progress Notes (Signed)
Brittany Santos - 57 y.o. female MRN [id number]  Date of birth: 1961-11-27  Office Visit Note: Visit Date: 03/14/2018 PCP: Leone Haven, MD Referred by: Leone Haven, MD  Subjective: Chief Complaint  Patient presents with  . Lower Back - Pain  . Right Hip - Pain   HPI: Mrs. Wilcoxen is a 57 year old female who is well-known to [place].  She is followed by Dr. Berle Mull at [facility] orthopedics.  She has had issues which have mimicked a right L5 radiculitis radiculopathy over the years which seem to be related to pathology of the right hip.  She has had some mild degenerative changes of the hip and questionable labral tear or other issue.  Intra-articular hip injections have been very beneficial for her and will usually give her great relief lasting between 6 months to 4 months.  The last time I saw her was in December.  She has had an issue with an L2 compression fracture which is old at this point and not causing any issue.  She has had fairly recent lumbar spine imaging not showing anything at least around L5.  She has had prior L5 type injections in the remote past with no relief.  We will get a repeat the hip injection today.  She will continue to follow with Dr. Alfonso Ramus.   ROS Otherwise per HPI.  Assessment & Plan: Visit Diagnoses:  1. Pain in right hip     Plan: No additional findings.   Meds & Orders: No orders of the defined types were placed in this encounter.  No orders of the defined types were placed in this encounter.   Follow-up: No follow-ups on file.   Procedures: Large Joint Inj: R hip joint on 03/14/2018 3:41 PM Indications: diagnostic evaluation and pain Details: 22 G 3.5 in needle, fluoroscopy-guided anterior approach  Arthrogram: No  Medications: 80 mg triamcinolone acetonide 40 MG/ML; 3 mL bupivacaine 0.5 % Outcome: tolerated well, no immediate complications  There was excellent flow of contrast producing a partial arthrogram of the hip.  The patient did have relief of symptoms during the anesthetic phase of the injection. Procedure, treatment alternatives, risks and benefits explained, specific risks discussed. Consent was given by the patient. Immediately prior to procedure a time out was called to verify the correct patient, procedure, equipment, support staff and site/side marked as required. Patient was prepped and draped in the usual sterile fashion.      No notes on file   Clinical History: MRI LUMBAR SPINE WITHOUT CONTRAST  TECHNIQUE: Multiplanar, multisequence MR imaging of the lumbar spine was performed. No intravenous contrast was administered.  COMPARISON: Lumbar MRI dated 04/28/2016.  FINDINGS: Segmentation: Standard.  Alignment: Physiologic.  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 There has been mild further interval loss of height of the L2 vertebral body. There is no edema within the vertebral body to suggest acute or subacute fracture. There is no significant retropulsion of bony elements. No new compression deformity is identified. Bone marrow signal is otherwise unremarkable. There are multilevel small Schmorl's nodes that are unchanged.  Conus medullaris: Extends to the L1-2 level and appears normal.  Paraspinal and other soft tissues: Negative.  Disc levels:  T12-L1: Small disc bulge without significant foraminal narrowing or canal stenosis.  L1-2: Small disc bulge without significant foraminal narrowing or canal stenosis.  L2-3: Moderate disc bulge and mild ligamentum flavum hypertrophy with mild lateral recess  and foraminal narrowing. No significant canal stenosis.  L3-4: Small disc bulge and mild facet and flavum hypertrophy with minimal bilateral foraminal narrowing. No significant canal stenosis.  L4-5: Small disc bulge and mild facet and ligamentum flavum hypertrophy with  minimal bilateral foraminal narrowing. No significant canal stenosis.  L5-S1: No significant disc displacement, foraminal narrowing, or canal stenosis.  IMPRESSION: 1. Mild interval loss of height of the L2 vertebral body, but no bone marrow edema to suggest acute or subacute fracture, likely interval change related to initial injury. No significant retropulsion of bony elements. 2. Mild lumbar spondylosis without significant interval change. No high-grade canal stenosis or foraminal narrowing.   Electronically Signed By: Kristine Garbe M.D. On: 07/06/2016 15:51  MRI PELVIS WITHOUT CONTRAST  TECHNIQUE: Multiplanar, multisequence MR imaging was performed. No intravenous contrast was administered.  COMPARISON:  None.  FINDINGS: Both hips are normally located. Mild to moderate hip joint degenerative changes bilaterally but no findings for stress fracture or avascular necrosis. No hip joint effusion. No periarticular fluid collections to suggest a paralabral cyst mild gluteus medius tendinitis bilaterally but no findings for trochanteric bursitis. The surrounding hip and pelvic musculature is otherwise unremarkable. There is mild bilateral hamstring tendinopathy noted.  The pubic symphysis and SI joints are intact. No pelvic fractures or bone lesions. No obvious lower lumbar disc protrusions or spinal stenosis.  No significant intrapelvic abnormalities are identified. Moderate sigmoid diverticulosis is noted. The uterus and ovaries appear normal. Nabothian cysts are noted. No inguinal mass or adenopathy.  IMPRESSION: Mild to moderate hip joint degenerative changes bilaterally but no stress fracture or AVN.  Mild gluteus medius tendinitis bilaterally and mild bilateral hamstring tendinopathy.   Electronically Signed   By: Kalman Jewels M.D.   On: 11/27/2013 10:12   She reports that she has never smoked. She has never used smokeless tobacco.    Recent Labs    07/23/17 0818  HGBA1C 6.8*    Objective:  VS:  HT:    WT:   BMI:     BP:   HR: bpm  TEMP: ( )  RESP:  Physical Exam  Musculoskeletal:  Patient ambulates without aid with a slightly antalgic gait to the right.  She has a little bit of pain over the right greater trochanter she does have internal rotation pain is really in range of motion.  She has good distal strength.    Ortho Exam Imaging: No results found.  Past Medical/Family/Surgical/Social History: Medications & Allergies reviewed per EMR, new medications updated. Patient Active Problem List   Diagnosis Date Noted  . Back pain, chronic 07/06/2016  . IBS (irritable bowel syndrome) 12/21/2015  . GERD (gastroesophageal reflux disease) 04/21/2015  . Type 2 diabetes mellitus ([place]) 01/20/2015   Past Medical History:  Diagnosis Date  . Allergy   . Arthritis   . Diabetes mellitus without complication ([place])   . GERD (gastroesophageal reflux disease)   . History of colon polyps   . History of diverticulitis   . History of kidney stones 04/15/15   Family History  Problem Relation Age of Onset  . Hypertension Brother   . Breast cancer Mother   . Cancer Sister 84       Blood cancer (unknown)  . Cancer Maternal Aunt        Lung  . Cancer Maternal Uncle 30       kidney  . Heart attack Paternal Uncle   . Aneurysm Paternal Grandmother   . Diabetes Neg Hx  Past Surgical History:  Procedure Laterality Date  . BILATERAL CARPAL TUNNEL RELEASE    . BREAST SURGERY    . cervical neck fusion    . CHOLECYSTECTOMY    . ESOPHAGOGASTRODUODENOSCOPY (EGD) WITH PROPOFOL N/A 05/12/2016   Procedure: ESOPHAGOGASTRODUODENOSCOPY (EGD) WITH PROPOFOL;  Surgeon: Lollie Sails, MD;  Location: [facility] ENDOSCOPY;  Service: Endoscopy;  Laterality: N/A;   Social History   Occupational History  . Not on file  Tobacco Use  . Smoking status: Never Smoker  . Smokeless tobacco: Never Used  Substance and Sexual Activity  .  Alcohol use: No    Alcohol/week: 0.0 oz  . Drug use: No  . Sexual activity: Never

## 2018-03-14 NOTE — Progress Notes (Signed)
Numeric Pain Rating Scale and Functional Assessment Average Pain 9   In the last MONTH (on 0-10 scale) has pain interfered with the following?  1. General activity like being  able to carry out your everyday physical activities such as walking, climbing stairs, carrying groceries, or moving a chair?  Rating(7)   +Driver, -BT, -Dye Allergies. 

## 2018-03-18 ENCOUNTER — Ambulatory Visit: Payer: BLUE CROSS/BLUE SHIELD | Admitting: Family Medicine

## 2018-03-18 NOTE — Telephone Encounter (Signed)
I called pt and she stated that she sees the ENT continuously for on going issues with having sinus infections and allergies.

## 2018-03-18 NOTE — Telephone Encounter (Signed)
Referral placed.

## 2018-03-22 MED ORDER — TRIAMCINOLONE ACETONIDE 40 MG/ML IJ SUSP
80.0000 mg | INTRAMUSCULAR | Status: AC | PRN
  Administered 2018-03-14: 80 mg via INTRA_ARTICULAR

## 2018-03-22 MED ORDER — BUPIVACAINE HCL 0.5 % IJ SOLN
3.0000 mL | INTRAMUSCULAR | Status: AC | PRN
Start: 2018-03-14 — End: 2018-03-14
  Administered 2018-03-14: 3 mL via INTRA_ARTICULAR

## 2018-03-25 ENCOUNTER — Ambulatory Visit (INDEPENDENT_AMBULATORY_CARE_PROVIDER_SITE_OTHER): Payer: BLUE CROSS/BLUE SHIELD | Admitting: Physical Medicine and Rehabilitation

## 2018-04-07 ENCOUNTER — Other Ambulatory Visit: Payer: Self-pay | Admitting: Internal Medicine

## 2018-04-25 DIAGNOSIS — E139 Other specified diabetes mellitus without complications: Secondary | ICD-10-CM | POA: Diagnosis not present

## 2018-04-25 DIAGNOSIS — D2372 Other benign neoplasm of skin of left lower limb, including hip: Secondary | ICD-10-CM | POA: Diagnosis not present

## 2018-04-25 DIAGNOSIS — M21962 Unspecified acquired deformity of left lower leg: Secondary | ICD-10-CM | POA: Diagnosis not present

## 2018-04-25 DIAGNOSIS — M21622 Bunionette of left foot: Secondary | ICD-10-CM | POA: Diagnosis not present

## 2018-04-26 ENCOUNTER — Encounter: Payer: Self-pay | Admitting: Family Medicine

## 2018-04-26 ENCOUNTER — Other Ambulatory Visit: Payer: Self-pay

## 2018-04-26 ENCOUNTER — Telehealth: Payer: Self-pay | Admitting: Family Medicine

## 2018-04-26 ENCOUNTER — Ambulatory Visit (INDEPENDENT_AMBULATORY_CARE_PROVIDER_SITE_OTHER): Payer: BLUE CROSS/BLUE SHIELD | Admitting: Family Medicine

## 2018-04-26 VITALS — BP 136/90 | HR 85 | Temp 97.5°F | Ht 64.5 in | Wt 240.4 lb

## 2018-04-26 DIAGNOSIS — R0681 Apnea, not elsewhere classified: Secondary | ICD-10-CM

## 2018-04-26 DIAGNOSIS — R030 Elevated blood-pressure reading, without diagnosis of hypertension: Secondary | ICD-10-CM

## 2018-04-26 DIAGNOSIS — G8929 Other chronic pain: Secondary | ICD-10-CM

## 2018-04-26 DIAGNOSIS — R600 Localized edema: Secondary | ICD-10-CM | POA: Diagnosis not present

## 2018-04-26 DIAGNOSIS — M545 Low back pain, unspecified: Secondary | ICD-10-CM

## 2018-04-26 DIAGNOSIS — E119 Type 2 diabetes mellitus without complications: Secondary | ICD-10-CM | POA: Diagnosis not present

## 2018-04-26 DIAGNOSIS — G4733 Obstructive sleep apnea (adult) (pediatric): Secondary | ICD-10-CM | POA: Insufficient documentation

## 2018-04-26 DIAGNOSIS — L989 Disorder of the skin and subcutaneous tissue, unspecified: Secondary | ICD-10-CM

## 2018-04-26 DIAGNOSIS — L84 Corns and callosities: Secondary | ICD-10-CM | POA: Insufficient documentation

## 2018-04-26 DIAGNOSIS — I10 Essential (primary) hypertension: Secondary | ICD-10-CM | POA: Insufficient documentation

## 2018-04-26 LAB — COMPREHENSIVE METABOLIC PANEL
ALBUMIN: 3.9 g/dL (ref 3.5–5.2)
ALK PHOS: 58 U/L (ref 39–117)
ALT: 16 U/L (ref 0–35)
AST: 15 U/L (ref 0–37)
BUN: 13 mg/dL (ref 6–23)
CO2: 30 mEq/L (ref 19–32)
Calcium: 9.3 mg/dL (ref 8.4–10.5)
Chloride: 102 mEq/L (ref 96–112)
Creatinine, Ser: 0.69 mg/dL (ref 0.40–1.20)
GFR: 92.82 mL/min (ref >60.00)
Glucose, Bld: 198 mg/dL — ABNORMAL HIGH (ref 70–99)
POTASSIUM: 4 meq/L (ref 3.5–5.1)
SODIUM: 139 meq/L (ref 135–145)
TOTAL PROTEIN: 6.7 g/dL (ref 6.0–8.3)
Total Bilirubin: 0.5 mg/dL (ref 0.2–1.2)

## 2018-04-26 LAB — CBC
HCT: 38.3 % (ref 36.0–46.0)
Hemoglobin: 12.9 g/dL (ref 12.0–15.0)
MCHC: 33.6 g/dL (ref 30.0–36.0)
MCV: 90.5 fl (ref 78.0–100.0)
Platelets: 251 10*3/uL (ref 150.0–400.0)
RBC: 4.24 Mil/uL (ref 3.87–5.11)
RDW: 15 % (ref 11.5–15.5)
WBC: 8.3 10*3/uL (ref 4.0–10.5)

## 2018-04-26 LAB — LIPID PANEL
CHOL/HDL RATIO: 2
CHOLESTEROL: 129 mg/dL (ref 0–200)
HDL: 52.6 mg/dL (ref >39.00)
LDL CALC: 60 mg/dL (ref 0–99)
NonHDL: 75.96
Triglycerides: 81 mg/dL (ref 0.0–149.0)
VLDL: 16.2 mg/dL (ref 0.0–40.0)

## 2018-04-26 LAB — HEMOGLOBIN A1C: HEMOGLOBIN A1C: 7.8 % — AB (ref 4.6–6.5)

## 2018-04-26 LAB — TSH: TSH: 1.85 u[IU]/mL (ref 0.35–4.50)

## 2018-04-26 MED ORDER — METFORMIN HCL 500 MG PO TABS: 1000.0000 mg | ORAL_TABLET | Freq: Two times a day (BID) | ORAL | 1 refills | Status: DC

## 2018-04-26 NOTE — Assessment & Plan Note (Signed)
Suspect venous insufficiency.  No CHF symptoms.  She will elevate her legs.  We will check lab work to evaluate for other causes.

## 2018-04-26 NOTE — Telephone Encounter (Signed)
This has been sent to her mail order pharmacy.

## 2018-04-26 NOTE — Assessment & Plan Note (Signed)
Benign exam today.  Will refer to physical therapy to do her exercises with her.

## 2018-04-26 NOTE — Telephone Encounter (Signed)
Please advise 

## 2018-04-26 NOTE — Addendum Note (Signed)
Addended by: Leone Haven on: 04/26/2018 05:26 PM   Modules accepted: Orders

## 2018-04-26 NOTE — Assessment & Plan Note (Signed)
Check A1c.  Continue metformin.  She will keep her appointment with ophthalmology.

## 2018-04-26 NOTE — Telephone Encounter (Signed)
Pt stated she  will need [facility] that includes the increased dose. Pt stated she does not want to run out of Metformin.  [facility] [place] [place] [facility] [employer] [employer] [phone] (Phone) [phone] (Fax       Notes recorded by Leone Haven, MD on 04/26/2018 at 12:53 PM EDT Please let the patient know that her A1c is worse at 7.8. I would like to increase her metformin dose to 1000 mg by mouth twice daily and plan to recheck in 3 months with an office visit. If she needs a refill on the metformin for this dosing please let me know. Her other lab work was acceptable. Thanks.

## 2018-04-26 NOTE — Assessment & Plan Note (Signed)
Sleep study ordered

## 2018-04-26 NOTE — Assessment & Plan Note (Signed)
Improved on recheck though still borderline.  Discussed checking it daily at home.  Discussed having her return for nurse visit though she was hesitant to do that.  She will call us in 2 weeks with her blood pressures.  If elevated at home we will add blood pressure medication.

## 2018-04-26 NOTE — Assessment & Plan Note (Signed)
She will continue to see podiatry.  I did discuss with her history of diabetes that there is always a risk with surgery in the area not healing well.  Advised that she speak with podiatry to get more detail about what kind of surgery they would be doing.

## 2018-04-26 NOTE — Patient Instructions (Addendum)
Nice to see you. Please start checking your blood pressure daily for the next 2 weeks and then we will have you return for nurse visit for blood pressure check. We will have labs done today and contact you with the results. Please try to prop your legs up as much as possible. We will refer you for physical therapy and to see dermatology.

## 2018-04-26 NOTE — Progress Notes (Signed)
[name], MD Phone: [phone]  Brittany Santos is a 57 y.o. female who presents today for f/u.  DIABETES Disease Monitoring: Blood Sugar ranges-130-200, higher values when she was on prednisone Polyuria/phagia/dipsia- dipsia though this is chronic      Optho- has an appointment Medications: Compliance- metformin Hypoglycemic symptoms- no  Patient has chronic back pain related to a fracture in her back previously.  She tries to do home exercises though this causes more discomfort.  Does note a right hip issue with possible nerve impingement there that radiates down her leg.  She sees orthopedics for that.  No incontinence.  No saddle anesthesia.  No numbness or weakness.  She has had some edema in her bilateral lower extremities recently.  Notes slight chronic erythema to the anterior lower legs.  Unchanged from prior.  Edema goes down if she props her legs up.  No orthopnea or PND.  Blood pressure is elevated today.  No history of this.  Does not check at home.  No chest pain or shortness of breath.  She reports a friend advised her that she was having apneic episodes when they were on vacation.  She does snore.  She does not wake up well rested.  She does have hypersomnia.  She does not fall asleep while driving.  She is seeing podiatry for calluses on the bottom of her left foot.  They are thinking about shaving the bone off to help with this.  She reports some chronic skin changes in her bilateral legs.  She reports she saw dermatology previously and they advised her that these are things that elderly people get.  They have been persistent and she has developed a few new spots.  Social History   Tobacco Use  Smoking Status Never Smoker  Smokeless Tobacco Never Used     ROS see history of present illness  Objective  Physical Exam Vitals:   04/26/18 0803 04/26/18 0833  BP: (!) 150/90 136/90  Pulse: 85   Temp: (!) 97.5 F (36.4 C)   SpO2: 98%     BP  Readings from Last 3 Encounters:  04/26/18 136/90  09/28/17 130/90  07/23/17 128/88   Wt Readings from Last 3 Encounters:  04/26/18 240 lb 6.4 oz (109 kg)  09/28/17 240 lb (108.9 kg)  07/23/17 240 lb (108.9 kg)    Physical Exam  Constitutional: No distress.  Cardiovascular: Normal rate, regular rhythm and normal heart sounds.  2+ DP pulses bilaterally  Pulmonary/Chest: Effort normal and breath sounds normal.  Musculoskeletal:  Trace edema bilateral lower extremities with slight erythema to the anterior lower shins with no induration, warmth, or tenderness, no midline spine tenderness, no midline spine step-off, no muscular back tenderness  Neurological: She is alert.  5/5 strength bilateral quads, hamstrings, plantar flexion, and dorsiflexion, sensation light touch intact bilateral lower extremities  Skin: Skin is warm and dry. She is not diaphoretic.  Scattered slightly annular raised lesions on bilateral lower extremities measuring at most 1 cm     Assessment/Plan: Please see individual problem list.  Type 2 diabetes mellitus Check A1c.  Continue metformin.  She will keep her appointment with ophthalmology.  Back pain, chronic Benign exam today.  Will refer to physical therapy to do her exercises with her.  Skin lesion Refer to dermatology for evaluation given persistence.  Bilateral leg edema Suspect venous insufficiency.  No CHF symptoms.  She will elevate her legs.  We will check lab work to evaluate for other causes.  Witnessed apneic spells Sleep study ordered.  Foot callus She will continue to see podiatry.  I did discuss with her history of diabetes that there is always a risk with surgery in the area not healing well.  Advised that she speak with podiatry to get more detail about what kind of surgery they would be doing.  Elevated blood pressure reading Improved on recheck though still borderline.  Discussed checking it daily at home.  Discussed having her  return for nurse visit though she was hesitant to do that.  She will call us in 2 weeks with her blood pressures.  If elevated at home we will add blood pressure medication.   Orders Placed This Encounter  Procedures  . Comp Met (CMET)  . Lipid panel  . HgB A1c  . CBC  . TSH  . Ambulatory referral to Dermatology    Referral Priority:   Routine    Referral Type:   Consultation    Referral Reason:   Specialty Services Required    Requested Specialty:   Dermatology    Number of Visits Requested:   1  . Ambulatory referral to Physical Therapy    Referral Priority:   Routine    Referral Type:   Physical Medicine    Referral Reason:   Specialty Services Required    Requested Specialty:   Physical Therapy    Number of Visits Requested:   1  . Split night study    Standing Status:   Future    Standing Expiration Date:   04/27/2019    Order Specific Question:   Where should this test be performed:    Answer:   [place]    No orders of the defined types were placed in this encounter.    [name], MD [place]

## 2018-04-26 NOTE — Assessment & Plan Note (Signed)
Refer to dermatology for evaluation given persistence.

## 2018-05-06 LAB — HM DIABETES EYE EXAM

## 2018-05-15 ENCOUNTER — Ambulatory Visit: Payer: BLUE CROSS/BLUE SHIELD | Attending: Family Medicine | Admitting: Physical Therapy

## 2018-05-15 ENCOUNTER — Encounter: Payer: Self-pay | Admitting: Physical Therapy

## 2018-05-15 DIAGNOSIS — R262 Difficulty in walking, not elsewhere classified: Secondary | ICD-10-CM | POA: Diagnosis not present

## 2018-05-15 DIAGNOSIS — M5441 Lumbago with sciatica, right side: Secondary | ICD-10-CM | POA: Insufficient documentation

## 2018-05-15 DIAGNOSIS — M62830 Muscle spasm of back: Secondary | ICD-10-CM | POA: Diagnosis not present

## 2018-05-15 DIAGNOSIS — G8929 Other chronic pain: Secondary | ICD-10-CM | POA: Diagnosis not present

## 2018-05-15 NOTE — Therapy (Signed)
[address] [place], [place], [address] Phone: [phone]   Fax:  [phone]  Physical Therapy Evaluation  Patient Details  Name: Brittany Santos XMIWOEHOZ MRN: [id number] Date of Birth: 09/03/1961 Referring Provider: Caryl Bis   Encounter Date: 05/15/2018  PT End of Session - 05/15/18 1728    Visit Number  1    Date for PT Re-Evaluation  07/15/18    PT Start Time  1655    PT Stop Time  1750    PT Time Calculation (min)  55 min    Activity Tolerance  Patient limited by pain    Behavior During Therapy  [facility] for tasks assessed/performed       Past Medical History:  Diagnosis Date  . Allergy   . Arthritis   . Diabetes mellitus without complication ([place])   . GERD (gastroesophageal reflux disease)   . History of colon polyps   . History of diverticulitis   . History of kidney stones 04/15/15    Past Surgical History:  Procedure Laterality Date  . BILATERAL CARPAL TUNNEL RELEASE    . BREAST SURGERY    . cervical neck fusion    . CHOLECYSTECTOMY    . ESOPHAGOGASTRODUODENOSCOPY (EGD) WITH PROPOFOL N/A 05/12/2016   Procedure: ESOPHAGOGASTRODUODENOSCOPY (EGD) WITH PROPOFOL;  Surgeon: Lollie Sails, MD;  Location: [facility] ENDOSCOPY;  Service: Endoscopy;  Laterality: N/A;    There were no vitals filed for this visit.   Subjective Assessment - 05/15/18 1657    Subjective  Patient had a fall about 2 years ago, she reports a compression fracture in the lumbar spine.  She was out of work for 3-4 months.  She had PT to help her walk again.  She reports that since that time she has continued to have pain.  She reports that she isalso having right hip pain.      Limitations  Sitting;Lifting;Standing;Walking;House hold activities    Diagnostic tests  X-ray/MRI indicating L2 compression fracture    Patient Stated Goals  have less pain    Currently in Pain?  Yes    Pain Score  8     Pain Location  Back into the  right groin    Pain Orientation  Right;Lower    Pain Descriptors / Indicators  Aching;Burning    Pain Type  Chronic pain    Pain Onset  More than a month ago    Pain Frequency  Constant    Aggravating Factors   sitting, bending, worse at the end of the day pain up to 10/10    Pain Relieving Factors  lie down, stretch out, at best 2-3/10    Effect of Pain on Daily Activities  just suffer with everything,          [facility] PT Assessment - 05/15/18 0001      Assessment   Medical Diagnosis  low back pain, right hip pain    Referring Provider  [place]    Onset Date/Surgical Date  04/16/16    Prior Therapy  2 years ago      Precautions   Precautions  None      Balance Screen   Has the patient fallen in the past 6 months  No    Has the patient had a decrease in activity level because of a fear of falling?   No    Is the patient reluctant to leave their home because of a fear of falling?  No      Home Environment   Additional Comments  does the housework, her mom lives with her and she does have to do some care for her.  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    Leisure  no exercise      Posture/Postural Control   Posture Comments  patient sits very upright, gaurded posture and motions      ROM / Strength   AROM / PROM / Strength  AROM;Strength      AROM   Overall AROM Comments  Lumbar ROM decreased 50% with pain      Strength   Overall Strength Comments  4-/5 for LE's with pain in the back and the right hip      Flexibility   Soft Tissue Assessment /Muscle Length  yes    Hamstrings  very tight with pain in the back and down the leg    Piriformis  very tight with pain in the hip and the back      Palpation   Palpation comment  she is very tender in the lateral right  thigh, the right buttock the lumbar spine, spasms in the lumbar area      Ambulation/Gait   Gait Comments  antalgic on the right, slightly stooped forward                Objective measurements completed on examination: See above findings.      [place] Adult PT Treatment/Exercise - 05/15/18 0001      Modalities   Modalities  Electrical Stimulation;Moist Heat      Moist Heat Therapy   Number Minutes Moist Heat  15 Minutes    Moist Heat Location  Lumbar Spine      Electrical Stimulation   Electrical Stimulation Location  low back     Electrical Stimulation Action  IFC    Electrical Stimulation Parameters  supine    Electrical Stimulation Goals  Pain             PT Education - 05/15/18 1727    Education provided  Yes    Education Details  WMs flexion, piriformis stertch    Person(s) Educated  Patient    Methods  Explanation;Demonstration;Handout    Comprehension  Verbalized understanding       PT Short Term Goals - 05/15/18 1732      PT SHORT TERM GOAL #1   Title  independent with initial HEP    Time  2    Period  Weeks    Status  New        PT Long Term Goals - 05/15/18 1733      PT LONG TERM GOAL #1   Title  decrease pain 25%    Time  8    Period  Weeks    Status  New      PT LONG TERM GOAL #2   Title  increase lumbar ROM 25%    Time  8    Period  Weeks    Status  New      PT LONG TERM GOAL #3   Title  Patient will be able to lift 30# off the floor with an increase in pain no more than  1/10 from baseline to return to work related activities.     Time  8    Period  Weeks    Status  New      PT LONG TERM GOAL #4   Title  Patient will be able to stand for at least 30 min with minimal increase in pain to complete work related activities.     Time  8    Period  Weeks    Status  New             Plan - 05/15/18 1729    Clinical Impression Statement  Patient had a fall about 2 years ago and sustained a lumbar compression fracture.   She reports that she has been dealing with pain sincce that time.  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  She is very gaurded with motions and transfers, she had ROM decreased 50%, she is very tight in the LE and back mms, positive SLR at 45 degrees, painful piriformis stretch, very tender in the back and the right hip    Clinical Presentation  Stable    Clinical Decision Making  Low    Rehab Potential  Fair    PT Frequency  2x / week    PT Duration  8 weeks    PT Treatment/Interventions  Cryotherapy;Lobbyist;Therapeutic exercise;Therapeutic activities;Patient/family education;Manual techniques;Taping;Dry needling;Gait training;Stair training;Iontophoresis 4mg /ml Dexamethasone;Moist Heat;ADLs/Self Care Home Management    PT Next Visit Plan  see if she felt like the estim was beneficial, slow prgoressioin of exercise    Consulted and Agree with Plan of Care  Patient       Patient will benefit from skilled therapeutic intervention in order to improve the following deficits and impairments:  Abnormal gait, Pain, Decreased mobility, Increased muscle spasms, Impaired flexibility, Difficulty walking, Decreased balance, Decreased activity tolerance, Decreased endurance, Decreased strength  Visit Diagnosis: Chronic right-sided low back pain with right-sided sciatica - Plan: PT plan of care cert/re-cert  Difficulty in walking, not elsewhere classified - Plan: PT plan of care cert/re-cert  Muscle spasm of back - Plan: PT plan of care cert/re-cert     Problem List Patient Active Problem List   Diagnosis Date Noted  . Bilateral leg edema 04/26/2018  . Witnessed apneic spells 04/26/2018  . Foot callus 04/26/2018  . Elevated blood pressure reading 04/26/2018  . Back pain, chronic 07/06/2016  . Skin lesion 06/19/2016  . IBS (irritable bowel syndrome) 12/21/2015  . GERD  (gastroesophageal reflux disease) 04/21/2015  . Type 2 diabetes mellitus ([place]) 01/20/2015    Sumner Boast., PT 05/15/2018, 5:43 PM  [address] [place], [place], [address] Phone: [phone]   Fax:  [phone]  Name: Brittany Santos [id number] MRN: [id number] Date of Birth: [birth year]

## 2018-05-17 ENCOUNTER — Ambulatory Visit: Payer: BLUE CROSS/BLUE SHIELD | Attending: Neurology

## 2018-05-17 DIAGNOSIS — G4733 Obstructive sleep apnea (adult) (pediatric): Secondary | ICD-10-CM | POA: Insufficient documentation

## 2018-05-17 DIAGNOSIS — F5101 Primary insomnia: Secondary | ICD-10-CM | POA: Insufficient documentation

## 2018-05-17 DIAGNOSIS — R0683 Snoring: Secondary | ICD-10-CM | POA: Diagnosis not present

## 2018-05-23 ENCOUNTER — Ambulatory Visit: Payer: BLUE CROSS/BLUE SHIELD | Admitting: Physical Therapy

## 2018-05-27 DIAGNOSIS — L565 Disseminated superficial actinic porokeratosis (DSAP): Secondary | ICD-10-CM | POA: Diagnosis not present

## 2018-05-28 ENCOUNTER — Ambulatory Visit: Payer: BLUE CROSS/BLUE SHIELD

## 2018-05-28 ENCOUNTER — Ambulatory Visit: Payer: BLUE CROSS/BLUE SHIELD | Admitting: Physical Therapy

## 2018-05-29 ENCOUNTER — Encounter: Payer: Self-pay | Admitting: Family Medicine

## 2018-05-30 ENCOUNTER — Ambulatory Visit: Payer: BLUE CROSS/BLUE SHIELD

## 2018-05-30 ENCOUNTER — Ambulatory Visit: Payer: BLUE CROSS/BLUE SHIELD | Admitting: Physical Therapy

## 2018-06-03 ENCOUNTER — Ambulatory Visit: Payer: BLUE CROSS/BLUE SHIELD | Attending: Family Medicine

## 2018-06-03 DIAGNOSIS — M5441 Lumbago with sciatica, right side: Secondary | ICD-10-CM | POA: Diagnosis not present

## 2018-06-03 DIAGNOSIS — M62830 Muscle spasm of back: Secondary | ICD-10-CM | POA: Diagnosis not present

## 2018-06-03 DIAGNOSIS — R262 Difficulty in walking, not elsewhere classified: Secondary | ICD-10-CM | POA: Diagnosis not present

## 2018-06-03 DIAGNOSIS — G8929 Other chronic pain: Secondary | ICD-10-CM

## 2018-06-03 NOTE — Therapy (Signed)
[facility] [address], [place], [address] Phone: [phone]   Fax:  [phone]  Physical Therapy Treatment  Patient Details  Name: Brittany Santos XAJOINOMV MRN: [id number] Date of Birth: 10-Mar-1961 Referring Provider: Caryl Bis   Encounter Date: 06/03/2018  PT End of Session - 06/03/18 0943    Visit Number  2    Date for PT Re-Evaluation  07/15/18    PT Start Time  0943    PT Stop Time  1031    PT Time Calculation (min)  48 min    Activity Tolerance  Patient limited by pain    Behavior During Therapy  [facility] for tasks assessed/performed       Past Medical History:  Diagnosis Date  . Allergy   . Arthritis   . Diabetes mellitus without complication ([place])   . GERD (gastroesophageal reflux disease)   . History of colon polyps   . History of diverticulitis   . History of kidney stones 04/15/15    Past Surgical History:  Procedure Laterality Date  . BILATERAL CARPAL TUNNEL RELEASE    . BREAST SURGERY    . cervical neck fusion    . CHOLECYSTECTOMY    . ESOPHAGOGASTRODUODENOSCOPY (EGD) WITH PROPOFOL N/A 05/12/2016   Procedure: ESOPHAGOGASTRODUODENOSCOPY (EGD) WITH PROPOFOL;  Surgeon: Lollie Sails, MD;  Location: [facility] ENDOSCOPY;  Service: Endoscopy;  Laterality: N/A;    There were no vitals filed for this visit.  Subjective Assessment - 06/03/18 0945    Subjective  Its hard to stand up straight. Sitting at a desk working at home. Sitting for prolonged periods is ok but when she stands up, her back and R anterior to posterior hip pain bothers her. Has 2 problems. Hx of low back fracture (tailbone and L5 vertebrae) 04/16/2016  as well as R hip pain.  7/10 R hip and 5-6/10 low back pain currently.  Also took an antiinflammatory this morning. Laying on R or L side tends to help her back pain.  The supine R piriformis HEP bothers her and she stopped doing it.  Still doing her lower trunk rotation and seated  transversus abdominis and pelvic floor contraction HEP and they seem to be doing ok.     Limitations  Sitting;Lifting;Standing;Walking;House hold activities    Diagnostic tests  X-ray/MRI indicating L2 compression fracture    Patient Stated Goals  have less pain    Currently in Pain?  Yes    Pain Score  7  R anterior hip pain    Pain Onset  More than a month ago         [facility] PT Assessment - 06/03/18 0951      Posture/Postural Control   Posture Comments  R iliac crest, greater trochanter higher, slight posterior R pelvic tilt. Decreased lumbar lordosis, slight L lateral shift.       AROM   Lumbar Flexion  90 %. Returning to neutral bothers her back bilateral lumbar paraspinal discomfort when returning    Lumbar Extension  60% with slight central low back pain.     Lumbar - Right Side Bend  30% with R low back/iliac creast pain    Lumbar - Left Side Bend  60% with R lateral low back pulling sensation    Lumbar - Right Rotation  100%/Full sitting    Lumbar - Left Rotation  90%/WFL sitting  Objectives  Standing back extension tends to help  Therapeutic Exercises  Lumbar AROM 1x each way  Flexion  Extension  R side bending  L side bending  Seated R rotation  Seated L rotation  Sitting with lumbar towel roll, to the R side. Pt states back feels a little better with it  Seated L hip extension isometrics 10x5 seconds for 3 sets   Seated R hip flexion isometrics 5x5 seconds for 2 sets (better able to perform after seated L hip extension isometrics exercise)   Seated L shoulder extension isometrics 10x5 seconds for 2 sets  Standing bilateral shoulder extension resisting red band 10x5 seconds for 2 sets   Standing R hip flexion with bilateral UE assist 7x. R low back discomfort.   Standing R hip extension with bilateral UE assist 5x. R low back irritation  Forward step up onto 4 inch step with L LE and R UE assist  5x3     Improved exercise technique, movement at target joints, use of target muscles after mod verbal, visual, tactile cues.   Worked on improving L glute muscle,  R hip flexor muscle and trunk muscle activation to promote better lumbopelvic posture. Decreased R hip pain to 2-3/10, and low back pain to 4/10 at end of session.      PT Education - 06/03/18 1004    Education provided  Yes    Education Details  ther-ex, HEP    Person(s) Educated  Patient    Methods  Demonstration;Explanation;Tactile cues;Verbal cues;Handout    Comprehension  Returned demonstration;Verbalized understanding          PT Short Term Goals - 05/15/18 1732      PT SHORT TERM GOAL #1   Title  independent with initial HEP    Time  2    Period  Weeks    Status  New        PT Long Term Goals - 05/15/18 1733      PT LONG TERM GOAL #1   Title  decrease pain 25%    Time  8    Period  Weeks    Status  New      PT LONG TERM GOAL #2   Title  increase lumbar ROM 25%    Time  8    Period  Weeks    Status  New      PT LONG TERM GOAL #3   Title  Patient will be able to lift 30# off the floor with an increase in pain no more than 1/10 from baseline to return to work related activities.     Time  8    Period  Weeks    Status  New      PT LONG TERM GOAL #4   Title  Patient will be able to stand for at least 30 min with minimal increase in pain to complete work related activities.     Time  8    Period  Weeks    Status  New            Plan - 06/03/18 0942    Clinical Impression Statement  Worked on improving L glute muscle,  R hip flexor muscle and trunk muscle activation to promote better lumbopelvic posture. Decreased R hip pain to 2-3/10, and low back pain to 4/10 at end of session.     Rehab Potential  Fair    PT Frequency  2x / week    PT Duration  8 weeks    PT Treatment/Interventions  Cryotherapy;Lobbyist;Therapeutic exercise;Therapeutic  activities;Patient/family education;Manual techniques;Taping;Dry needling;Gait training;Stair training;Iontophoresis 4mg /ml Dexamethasone;Moist Heat;ADLs/Self Care Home Management    PT Next Visit Plan  see if she felt like the estim was beneficial, slow prgoressioin of exercise    Consulted and Agree with Plan of Care  Patient       Patient will benefit from skilled therapeutic intervention in order to improve the following deficits and impairments:  Abnormal gait, Pain, Decreased mobility, Increased muscle spasms, Impaired flexibility, Difficulty walking, Decreased balance, Decreased activity tolerance, Decreased endurance, Decreased strength  Visit Diagnosis: Chronic right-sided low back pain with right-sided sciatica  Difficulty in walking, not elsewhere classified  Muscle spasm of back     Problem List Patient Active Problem List   Diagnosis Date Noted  . Bilateral leg edema 04/26/2018  . Witnessed apneic spells 04/26/2018  . Foot callus 04/26/2018  . Elevated blood pressure reading 04/26/2018  . Back pain, chronic 07/06/2016  . Skin lesion 06/19/2016  . IBS (irritable bowel syndrome) 12/21/2015  . GERD (gastroesophageal reflux disease) 04/21/2015  . Type 2 diabetes mellitus ([place]) 01/20/2015    Joneen Boers PT, DPT   06/03/2018, 12:52 PM   [facility] [address] [place], [address] Phone: [phone]   Fax:  [phone]  Name: Lakendra Helling [id number] MRN: [id number] Date of Birth: 12/03/1961

## 2018-06-03 NOTE — Patient Instructions (Signed)
Seated hip extension isometrics   Sitting on a chair,    Squeeze your rear end muscles together and press your L foot onto the floor.     Hold for 5 seconds    Repeat 10 times   Perform 2-3 sets daily.      This is a corrective exercise. Once you no longer have symptoms, you can stop.   

## 2018-06-05 ENCOUNTER — Ambulatory Visit: Payer: BLUE CROSS/BLUE SHIELD

## 2018-06-05 DIAGNOSIS — G8929 Other chronic pain: Secondary | ICD-10-CM | POA: Diagnosis not present

## 2018-06-05 DIAGNOSIS — M5441 Lumbago with sciatica, right side: Principal | ICD-10-CM

## 2018-06-05 DIAGNOSIS — R262 Difficulty in walking, not elsewhere classified: Secondary | ICD-10-CM | POA: Diagnosis not present

## 2018-06-05 DIAGNOSIS — M62830 Muscle spasm of back: Secondary | ICD-10-CM | POA: Diagnosis not present

## 2018-06-05 NOTE — Therapy (Signed)
[facility] [address] [place], [address] Phone: [phone]   Fax:  [phone]  Physical Therapy Treatment  Patient Details  Name: Brittany Santos WFUXNATFT MRN: [id number] Date of Birth: 1961-08-24 Referring Provider: Caryl Bis   Encounter Date: 06/05/2018  PT End of Session - 06/05/18 1727    Visit Number  3    Date for PT Re-Evaluation  07/15/18    PT Start Time  7062    PT Stop Time  1820    PT Time Calculation (min)  53 min    Activity Tolerance  Patient limited by pain    Behavior During Therapy  [facility] for tasks assessed/performed       Past Medical History:  Diagnosis Date  . Allergy   . Arthritis   . Diabetes mellitus without complication ([place])   . GERD (gastroesophageal reflux disease)   . History of colon polyps   . History of diverticulitis   . History of kidney stones 04/15/15    Past Surgical History:  Procedure Laterality Date  . BILATERAL CARPAL TUNNEL RELEASE    . BREAST SURGERY    . cervical neck fusion    . CHOLECYSTECTOMY    . ESOPHAGOGASTRODUODENOSCOPY (EGD) WITH PROPOFOL N/A 05/12/2016   Procedure: ESOPHAGOGASTRODUODENOSCOPY (EGD) WITH PROPOFOL;  Surgeon: Lollie Sails, MD;  Location: [facility] ENDOSCOPY;  Service: Endoscopy;  Laterality: N/A;    There were no vitals filed for this visit.  Subjective Assessment - 06/05/18 1728    Subjective  Spent 10 hours today driving and sitting in a car. When she got back home, it took her some time to get out of the car. R knee felt like her knee cap wanted to pop out.  It has happened before but a long time ago.  2-3/10 back pain, 6/10 R hip pain currently. Feels R LE radiating symptoms.     Limitations  Sitting;Lifting;Standing;Walking;House hold activities    Diagnostic tests  X-ray/MRI indicating L2 compression fracture    Patient Stated Goals  have less pain    Currently in Pain?  Yes    Pain Score  6  R LE    Pain Onset  More than  a month ago                               PT Education - 06/05/18 1732    Education provided  Yes    Education Details  ther-ex    [employer]) Educated  Patient    Methods  Explanation;Demonstration;Tactile cues;Verbal cues    Comprehension  Returned demonstration;Verbalized understanding      Objectives     Therapeutic Exercises   Seated L hip extension isometrics 10x5 seconds for 2 sets   Seated bilateral shoulder extension isometrics, hands on thighs to promote trunk muscle use 10x5 seconds for 2 sets   Seated R trunk rotation 10x5 seconds. R lateral thigh pulling sensation which eases with rest  Seated L trunk rotation 3x5 seconds. Back muscle knotting sensation  Seated R hip extension, 10x5 seconds, R foot not touching floor for knee joint comfort  Seated hip adduction ball and glute max squeeze 10x5 seconds for 2 sets   Standing low rows resisting red band with scapular retraction 10x5 seconds for 2 sets    Forward step up onto 4 inch step with L LE and R UE assist 10x3  Standing L LE leg press  resisting blue band with bilateral UE assist 10x2  R LE discomfort from glute squeeze per pt.   D/C exercise    Sitting with R foot propped on step. Decreased R LE discomfort.   Sitting R hip extension isometrics again, feet not touching floor for knee comfort 10x5 seconds   Improved exercise technique, movement at target joints, use of target muscles after mod verbal, visual, tactile cues.    Pt demonstrates patellar symptoms based on subjective reports. Added seated hip adduction ball and glute max squeeze to help promote VMO muscle use to help address.  Decreased back and R LE pain with exercises to promote trunk and glute muscle activation bilaterally. R LE symptoms however increased following standing L LE leg press exercise. Decreased pain with seated R hip extension isometrics.3-4/10 R LE pain and 3/10 low back pain after session.         PT Short Term Goals - 05/15/18 1732      PT SHORT TERM GOAL #1   Title  independent with initial HEP    Time  2    Period  Weeks    Status  New        PT Long Term Goals - 05/15/18 1733      PT LONG TERM GOAL #1   Title  decrease pain 25%    Time  8    Period  Weeks    Status  New      PT LONG TERM GOAL #2   Title  increase lumbar ROM 25%    Time  8    Period  Weeks    Status  New      PT LONG TERM GOAL #3   Title  Patient will be able to lift 30# off the floor with an increase in pain no more than 1/10 from baseline to return to work related activities.     Time  8    Period  Weeks    Status  New      PT LONG TERM GOAL #4   Title  Patient will be able to stand for at least 30 min with minimal increase in pain to complete work related activities.     Time  8    Period  Weeks    Status  New            Plan - 06/05/18 1726    Clinical Impression Statement  Pt demonstrates patellar symptoms based on subjective reports. Added seated hip adduction ball and glute max squeeze to help promote VMO muscle use to help address.  Decreased back and R LE pain with exercises to promote trunk and glute muscle activation bilaterally. R LE symptoms however increased following standing L LE leg press exercise. Decreased pain with seated R hip extension isometrics.3-4/10 R LE pain and 3/10 low back pain after session.     Rehab Potential  Fair    PT Frequency  2x / week    PT Duration  8 weeks    PT Treatment/Interventions  Cryotherapy;Lobbyist;Therapeutic exercise;Therapeutic activities;Patient/family education;Manual techniques;Taping;Dry needling;Gait training;Stair training;Iontophoresis 4mg /ml Dexamethasone;Moist Heat;ADLs/Self Care Home Management    PT Next Visit Plan  see if she felt like the estim was beneficial, slow prgoressioin of exercise    Consulted and Agree with Plan of Care  Patient       Patient will benefit from  skilled therapeutic intervention in order to improve the following deficits and impairments:  Abnormal gait, Pain, Decreased  mobility, Increased muscle spasms, Impaired flexibility, Difficulty walking, Decreased balance, Decreased activity tolerance, Decreased endurance, Decreased strength  Visit Diagnosis: Chronic right-sided low back pain with right-sided sciatica  Difficulty in walking, not elsewhere classified  Muscle spasm of back     Problem List Patient Active Problem List   Diagnosis Date Noted  . Bilateral leg edema 04/26/2018  . Witnessed apneic spells 04/26/2018  . Foot callus 04/26/2018  . Elevated blood pressure reading 04/26/2018  . Back pain, chronic 07/06/2016  . Skin lesion 06/19/2016  . IBS (irritable bowel syndrome) 12/21/2015  . GERD (gastroesophageal reflux disease) 04/21/2015  . Type 2 diabetes mellitus ([place]) 01/20/2015    Joneen Boers PT, DPT   06/05/2018, 6:30 PM  [facility] [address] [place], [address] Phone: [phone]   Fax:  [phone]  Name: Brittany Santos [id number] MRN: [id number] Date of Birth: 1961/03/31

## 2018-06-06 ENCOUNTER — Telehealth (INDEPENDENT_AMBULATORY_CARE_PROVIDER_SITE_OTHER): Payer: Self-pay | Admitting: *Deleted

## 2018-06-07 ENCOUNTER — Other Ambulatory Visit (INDEPENDENT_AMBULATORY_CARE_PROVIDER_SITE_OTHER): Payer: Self-pay | Admitting: Physical Medicine and Rehabilitation

## 2018-06-07 DIAGNOSIS — M25551 Pain in right hip: Secondary | ICD-10-CM

## 2018-06-07 NOTE — Telephone Encounter (Signed)
MRI of right hip ordered, can you tell Brittany Santos to switch from my que to hers? [name] usually does this

## 2018-06-07 NOTE — Progress Notes (Signed)
Chronic worse severe hip pain, usually relieved with injection, but less effective and now with worsening from PT.

## 2018-06-07 NOTE — Telephone Encounter (Signed)
Order changed over to my workq

## 2018-06-07 NOTE — Telephone Encounter (Signed)
Called pt and advised.  

## 2018-06-11 DIAGNOSIS — G4733 Obstructive sleep apnea (adult) (pediatric): Secondary | ICD-10-CM | POA: Diagnosis not present

## 2018-06-13 ENCOUNTER — Ambulatory Visit
Admission: RE | Admit: 2018-06-13 | Discharge: 2018-06-13 | Disposition: A | Discharge status: Home / Self Care | Payer: BLUE CROSS/BLUE SHIELD | Source: Ambulatory Visit | Attending: Physical Medicine and Rehabilitation | Admitting: Physical Medicine and Rehabilitation

## 2018-06-13 DIAGNOSIS — M25551 Pain in right hip: Secondary | ICD-10-CM | POA: Diagnosis not present

## 2018-06-14 ENCOUNTER — Encounter (INDEPENDENT_AMBULATORY_CARE_PROVIDER_SITE_OTHER): Payer: Self-pay | Admitting: Physical Medicine and Rehabilitation

## 2018-06-17 ENCOUNTER — Ambulatory Visit: Payer: BLUE CROSS/BLUE SHIELD

## 2018-06-19 ENCOUNTER — Ambulatory Visit: Payer: BLUE CROSS/BLUE SHIELD

## 2018-06-25 ENCOUNTER — Ambulatory Visit: Payer: BLUE CROSS/BLUE SHIELD

## 2018-06-26 ENCOUNTER — Ambulatory Visit (INDEPENDENT_AMBULATORY_CARE_PROVIDER_SITE_OTHER): Payer: BLUE CROSS/BLUE SHIELD | Admitting: Orthopaedic Surgery

## 2018-06-26 ENCOUNTER — Encounter

## 2018-06-26 ENCOUNTER — Encounter (INDEPENDENT_AMBULATORY_CARE_PROVIDER_SITE_OTHER): Payer: Self-pay | Admitting: Orthopaedic Surgery

## 2018-06-26 DIAGNOSIS — M25551 Pain in right hip: Secondary | ICD-10-CM

## 2018-06-26 NOTE — Progress Notes (Signed)
Office Visit Note   Patient: Brittany Santos [id number]           Date of Birth: September 05, 1961           MRN: [id number] Visit Date: 06/26/2018              Requested by: Leone Haven, MD [address] [place], [place] [address] PCP: Leone Haven, MD   Assessment & Plan: Visit Diagnoses:  1. Pain of right hip joint     Plan: I am at a loss of why the patient has the severe pain that she has in the right hip that radiates all the way down to her foot.  Certainly component of this may be lumbar spine related and apparently she is been worked up for this before and has a history of an L2 compression fracture.  In light of the MRI findings showing normal cartilage in her hip joint, she definitely does not need a hip replacement.  Certainly there is findings around the joint as it relates to the intracapsular ligament areas and other issues that at least would benefit from an opinion from 1 of my colleagues who does perform hip arthroscopic surgery to see if she may be a candidate for this type of intervention.  Since she has had injections in her hip before that had helped in the past this may be option however I cautioned her that her weight may be a complicating feature for hip arthroscopic intervention and that the hip arthroscopy specialist may feel that there is not an intervention is truly warranted for hip that would take care of the pain that she is having.  I would like to send her to Dr. Victorino December with [facility] for second opinion.  Follow-Up Instructions: We will make a referral to a hip arthroscopy specialist to get an opinion of whether or not an intervention is needed for the right hip.  Orders:  No orders of the defined types were placed in this encounter.  No orders of the defined types were placed in this encounter.     Procedures: No procedures performed   Clinical Data: No additional findings.   Subjective: Chief Complaint  Patient  presents with  . Right Hip - Pain  The patient is referred to me Dr. Ernestina Patches here in the office who is provided interventions in her lumbar spine and in her right hip from a physiatry standpoint.  She is someone who is been dealing with debilitating right hip pain and right back pain with symptoms going all the way down her foot and ankle for some time now.  She ambulates using a cane.  She has a BMI of almost 41.  She gets numbness and tingling going down into her foot.  She has had steroid injections under direct fluoroscopy in her right hip that used to help her but now do not.  She is had a recent MRI of her right hip that was seen by Dr. Ernestina Patches and thus led to her referral to me to further evaluate her right hip.  Again she reports pain from globally around her hip down her thigh and into her knee almost like a tight band she states around her leg but it does go all the way to her foot.  HPI  Review of Systems She currently denies any other systemic illnesses.  She denies any chest pain, shortness of breath, fever, chills, nausea, vomiting.    Objective: Vital Signs: LMP  (  LMP Unknown) Comment: Patient states she had her "uterus scraped" sometime in the past 10 years.   Physical Exam She is alert and oriented x3 and in no acute distress.  She is ambulate with a cane. Ortho Exam On examination she is moderately to significantly obese.  She is pleasant to talk to.  It is difficult to get a good exam out of her leg due to significant pain with any attempts to put her hip knee or leg to motion.  It is a global pain that seems to be debilitating. Specialty Comments:  No specialty comments available.  Imaging: No results found. MRI is seen and I have reviewed this independently as well as reviewed the radiologist report.  The cartilage of her hip is entirely intact in terms of the femoral head and acetabulum.  They report partial tearing of the iliofemoral ligament and the anterior joint capsule  which could be related to trauma or even injections.  There is edema in the right quadratus femoris muscle and they talk about the possibility of ischiofemoral impingement.  There is only a minimal joint effusion.  There is no evidence of avascular necrosis or fracture.  PMFS History: Patient Active Problem List   Diagnosis Date Noted  . Bilateral leg edema 04/26/2018  . Witnessed apneic spells 04/26/2018  . Foot callus 04/26/2018  . Elevated blood pressure reading 04/26/2018  . Back pain, chronic 07/06/2016  . Skin lesion 06/19/2016  . IBS (irritable bowel syndrome) 12/21/2015  . GERD (gastroesophageal reflux disease) 04/21/2015  . Type 2 diabetes mellitus ([place]) 01/20/2015   Past Medical History:  Diagnosis Date  . Allergy   . Arthritis   . Diabetes mellitus without complication ([place])   . GERD (gastroesophageal reflux disease)   . History of colon polyps   . History of diverticulitis   . History of kidney stones 04/15/15    Family History  Problem Relation Age of Onset  . Hypertension Brother   . Breast cancer Mother   . Cancer Sister 40       Blood cancer (unknown)  . Cancer Maternal Aunt        Lung  . Cancer Maternal Uncle 30       kidney  . Heart attack Paternal Uncle   . Aneurysm Paternal Grandmother   . Diabetes Neg Hx     Past Surgical History:  Procedure Laterality Date  . BILATERAL CARPAL TUNNEL RELEASE    . BREAST SURGERY    . cervical neck fusion    . CHOLECYSTECTOMY    . ESOPHAGOGASTRODUODENOSCOPY (EGD) WITH PROPOFOL N/A 05/12/2016   Procedure: ESOPHAGOGASTRODUODENOSCOPY (EGD) WITH PROPOFOL;  Surgeon: Lollie Sails, MD;  Location: [facility] ENDOSCOPY;  Service: Endoscopy;  Laterality: N/A;   Social History   Occupational History  . Not on file  Tobacco Use  . Smoking status: Never Smoker  . Smokeless tobacco: Never Used  Substance and Sexual Activity  . Alcohol use: No    Alcohol/week: 0.0 oz  . Drug use: No  . Sexual activity: Never

## 2018-06-28 ENCOUNTER — Other Ambulatory Visit (INDEPENDENT_AMBULATORY_CARE_PROVIDER_SITE_OTHER): Payer: Self-pay

## 2018-06-28 DIAGNOSIS — M25551 Pain in right hip: Secondary | ICD-10-CM

## 2018-07-01 ENCOUNTER — Telehealth (INDEPENDENT_AMBULATORY_CARE_PROVIDER_SITE_OTHER): Payer: Self-pay | Admitting: Orthopaedic Surgery

## 2018-07-01 NOTE — Telephone Encounter (Signed)
It looks like (from the last ov note) that Dr. Ninfa Linden was sending the patient to [facility] for a 2nd opinion. Do you have any information on this?

## 2018-07-01 NOTE — Telephone Encounter (Signed)
Patient called this morning stating that she saw Dr. Ninfa Linden on Wednesday, the 10th and he stated that she did not have a hip issue and that he was going to consult with a back specialist, but has not heard anything.  CB#[phone].  Thank you.

## 2018-07-01 NOTE — Telephone Encounter (Signed)
I just sent referral to [facility],pending appt

## 2018-07-08 DIAGNOSIS — L565 Disseminated superficial actinic porokeratosis (DSAP): Secondary | ICD-10-CM | POA: Diagnosis not present

## 2018-07-11 DIAGNOSIS — G4733 Obstructive sleep apnea (adult) (pediatric): Secondary | ICD-10-CM | POA: Diagnosis not present

## 2018-07-11 DIAGNOSIS — M7061 Trochanteric bursitis, right hip: Secondary | ICD-10-CM | POA: Diagnosis not present

## 2018-07-22 DIAGNOSIS — G4733 Obstructive sleep apnea (adult) (pediatric): Secondary | ICD-10-CM | POA: Diagnosis not present

## 2018-07-24 DIAGNOSIS — M5441 Lumbago with sciatica, right side: Principal | ICD-10-CM

## 2018-07-24 DIAGNOSIS — M62830 Muscle spasm of back: Secondary | ICD-10-CM

## 2018-07-24 DIAGNOSIS — R262 Difficulty in walking, not elsewhere classified: Secondary | ICD-10-CM

## 2018-07-24 DIAGNOSIS — G8929 Other chronic pain: Secondary | ICD-10-CM

## 2018-07-24 NOTE — Therapy (Signed)
[facility] [address], [place], [address] Phone: [phone]   Fax:  [phone]  Physical Therapy Discharge Summary   Patient Details  Name: Brittany Santos MRN: [id number] Date of Birth: 1961/02/05 Referring Provider: Caryl Bis   Encounter Date: 07/24/2018    Past Medical History:  Diagnosis Date  . Allergy   . Arthritis   . Diabetes mellitus without complication ([place])   . GERD (gastroesophageal reflux disease)   . History of colon polyps   . History of diverticulitis   . History of kidney stones 04/15/15    Past Surgical History:  Procedure Laterality Date  . BILATERAL CARPAL TUNNEL RELEASE    . BREAST SURGERY    . cervical neck fusion    . CHOLECYSTECTOMY    . ESOPHAGOGASTRODUODENOSCOPY (EGD) WITH PROPOFOL N/A 05/12/2016   Procedure: ESOPHAGOGASTRODUODENOSCOPY (EGD) WITH PROPOFOL;  Surgeon: Lollie Sails, MD;  Location: [facility] ENDOSCOPY;  Service: Endoscopy;  Laterality: N/A;    There were no vitals filed for this visit.                            Pt was able to attend 3 physical therapy visits. Pt called the clinic and left a message on 06/14/2018 to cancel all appointments secondary to worsening hip pain and wanting to have an MRI done. Skilled physical therapy services discharged secondary to pt request.     PT Short Term Goals - 07/24/18 1734      PT SHORT TERM GOAL #1   Title  independent with initial HEP    Time  2    Period  Weeks    Status  Unable to assess        PT Long Term Goals - 07/24/18 1734      PT LONG TERM GOAL #1   Title  decrease pain 25%    Time  8    Period  Weeks    Status  Unable to assess      PT LONG TERM GOAL #2   Title  increase lumbar ROM 25%    Time  8    Period  Weeks    Status  Unable to assess      PT LONG TERM GOAL #3   Title  Patient will be able to lift 30# off the floor with an increase in pain no more  than 1/10 from baseline to return to work related activities.     Time  8    Period  Weeks    Status  Unable to assess      PT LONG TERM GOAL #4   Title  Patient will be able to stand for at least 30 min with minimal increase in pain to complete work related activities.     Time  8    Period  Weeks    Status  Unable to assess            Plan - 07/24/18 1738    Clinical Impression Statement  Pt was able to attend 3 physical therapy visits. Pt called the clinic and left a message on 06/14/2018 to cancel all appointments secondary to worsening hip pain and wanting to have an MRI done. Skilled physical therapy services discharged secondary to pt request.    Rehab Potential  Fair    PT Treatment/Interventions  Therapeutic exercise;Therapeutic activities;Patient/family education;Manual techniques    Consulted and Agree  with Plan of Care  Patient       Patient will benefit from skilled therapeutic intervention in order to improve the following deficits and impairments:  Abnormal gait, Pain, Decreased mobility, Increased muscle spasms, Impaired flexibility, Difficulty walking, Decreased balance, Decreased activity tolerance, Decreased endurance, Decreased strength  Visit Diagnosis: Chronic right-sided low back pain with right-sided sciatica  Difficulty in walking, not elsewhere classified  Muscle spasm of back     Problem List Patient Active Problem List   Diagnosis Date Noted  . Bilateral leg edema 04/26/2018  . Witnessed apneic spells 04/26/2018  . Foot callus 04/26/2018  . Elevated blood pressure reading 04/26/2018  . Back pain, chronic 07/06/2016  . Skin lesion 06/19/2016  . IBS (irritable bowel syndrome) 12/21/2015  . GERD (gastroesophageal reflux disease) 04/21/2015  . Type 2 diabetes mellitus ([place]) 01/20/2015   Thank you for your referral.  Joneen Boers PT, DPT   07/24/2018, 5:40 PM  [facility] [address] [place], [address] Phone: [phone]   Fax:  [phone]  Name: Brittany Santos MRN: [id number] Date of Birth: 1961/12/04

## 2018-08-11 DIAGNOSIS — G4733 Obstructive sleep apnea (adult) (pediatric): Secondary | ICD-10-CM | POA: Diagnosis not present

## 2018-08-16 ENCOUNTER — Ambulatory Visit: Payer: BLUE CROSS/BLUE SHIELD | Admitting: Family Medicine

## 2018-08-16 ENCOUNTER — Ambulatory Visit (INDEPENDENT_AMBULATORY_CARE_PROVIDER_SITE_OTHER): Payer: BLUE CROSS/BLUE SHIELD

## 2018-08-16 ENCOUNTER — Ambulatory Visit (INDEPENDENT_AMBULATORY_CARE_PROVIDER_SITE_OTHER): Payer: BLUE CROSS/BLUE SHIELD | Admitting: Family Medicine

## 2018-08-16 ENCOUNTER — Other Ambulatory Visit: Payer: BLUE CROSS/BLUE SHIELD

## 2018-08-16 ENCOUNTER — Encounter: Payer: Self-pay | Admitting: Family Medicine

## 2018-08-16 VITALS — BP 140/94 | HR 81 | Temp 97.4°F | Wt 239.2 lb

## 2018-08-16 DIAGNOSIS — G8929 Other chronic pain: Secondary | ICD-10-CM

## 2018-08-16 DIAGNOSIS — M5441 Lumbago with sciatica, right side: Secondary | ICD-10-CM

## 2018-08-16 DIAGNOSIS — E119 Type 2 diabetes mellitus without complications: Secondary | ICD-10-CM

## 2018-08-16 DIAGNOSIS — G4733 Obstructive sleep apnea (adult) (pediatric): Secondary | ICD-10-CM

## 2018-08-16 DIAGNOSIS — I10 Essential (primary) hypertension: Secondary | ICD-10-CM

## 2018-08-16 DIAGNOSIS — S32020A Wedge compression fracture of second lumbar vertebra, initial encounter for closed fracture: Secondary | ICD-10-CM | POA: Diagnosis not present

## 2018-08-16 DIAGNOSIS — L989 Disorder of the skin and subcutaneous tissue, unspecified: Secondary | ICD-10-CM | POA: Diagnosis not present

## 2018-08-16 LAB — BASIC METABOLIC PANEL
BUN: 16 mg/dL (ref 6–23)
CALCIUM: 9.6 mg/dL (ref 8.4–10.5)
CO2: 29 meq/L (ref 19–32)
CREATININE: 0.72 mg/dL (ref 0.40–1.20)
Chloride: 100 mEq/L (ref 96–112)
GFR: 88.28 mL/min (ref >60.00)
Glucose, Bld: 167 mg/dL — ABNORMAL HIGH (ref 70–99)
Potassium: 3.8 mEq/L (ref 3.5–5.1)
SODIUM: 138 meq/L (ref 135–145)

## 2018-08-16 LAB — MICROALBUMIN / CREATININE URINE RATIO
CREATININE, U: 46.2 mg/dL
MICROALB/CREAT RATIO: 1.5 mg/g (ref 0.0–30.0)

## 2018-08-16 LAB — HEMOGLOBIN A1C: HEMOGLOBIN A1C: 7.5 % — AB (ref 4.6–6.5)

## 2018-08-16 LAB — POCT URINE PREGNANCY: PREG TEST UR: NEGATIVE

## 2018-08-16 NOTE — Assessment & Plan Note (Signed)
Check A1c.  Continue metformin.  She will likely need to take it twice daily.

## 2018-08-16 NOTE — Progress Notes (Signed)
[name], MD Phone: [phone]  Brittany Santos is a 57 y.o. female who presents today for f/u  CC: DM, OSA, low back pain, htn  DIABETES Disease Monitoring: Blood Sugar ranges-130-175 Polyuria/phagia/dipsia- no      Optho- UTD Medications: Compliance- taking metformin once daily Hypoglycemic symptoms- no [place] pneumonia vaccine at the pharmacy.  OSA: Diagnosed by sleep study.  Currently on CPAP.  She is sleeping quite a bit better with it.  Uses it greater than 4 hours.  No hypersomnia.  Not waking up terribly well rested though that may be stress related as she is caring for her mother.  Chronic low back pain: Right-sided.  She has been evaluated for right hip and was diagnosed with OA though the steroid injections are not seeming to help and she wonders if it is from her back.  She has the pain radiating from her buttock around and down the front of her leg.  Occasional numbness in her right leg if she drives for a long time.  No weakness.  No saddle anesthesia.  No incontinence.  Hypertension: Checked at home and bring in 127-167/67-89.  This was in May.  No chest pain or shortness of breath.  Occasional edema in her lower extremities that goes down overnight.  No orthopnea or PND.   Social History   Tobacco Use  Smoking Status Never Smoker  Smokeless Tobacco Never Used     ROS see history of present illness  Objective  Physical Exam Vitals:   08/16/18 0804  BP: (!) 140/94  Pulse: 81  Temp: (!) 97.4 F (36.3 C)  SpO2: 97%    BP Readings from Last 3 Encounters:  08/16/18 (!) 140/94  04/26/18 136/90  09/28/17 130/90   Wt Readings from Last 3 Encounters:  08/16/18 239 lb 3.2 oz (108.5 kg)  04/26/18 240 lb 6.4 oz (109 kg)  09/28/17 240 lb (108.9 kg)    Physical Exam  Constitutional: No distress.  Cardiovascular: Normal rate, regular rhythm and normal heart sounds.  Pulmonary/Chest: Effort normal and breath sounds normal.  Musculoskeletal: She  exhibits no edema.  No midline spine tenderness, no midline spine step-off, does have some muscular back tenderness in right lumbar spine  Neurological: She is alert.  5/5 strength bilateral quads, hamstrings, plantar flexion, and dorsiflexion, sensation light touch intact bilateral lower extremities  Skin: Skin is warm and dry. She is not diaphoretic.   Diabetic Foot Exam - Simple   Simple Foot Form Diabetic Foot exam was performed with the following findings:  Yes 08/16/2018  8:36 AM  Visual Inspection No deformities, no ulcerations, no other skin breakdown bilaterally:  Yes Sensation Testing Intact to touch and monofilament testing bilaterally:  Yes Pulse Check Posterior Tibialis and Dorsalis pulse intact bilaterally:  Yes Comments      Assessment/Plan: Please see individual problem list.  Type 2 diabetes mellitus Check A1c.  Continue metformin.  She will likely need to take it twice daily.  Skin lesion Patient reports she has seen dermatology.  Back pain, chronic Chronic issue.  Sounds as though she may have some sciatica.  We will check an x-ray today.  May need an MRI or referral to spine specialist.  Hypertension Home blood pressures indicate hypertension.  We will check kidney function and electrolytes and then determine medication.  OSA (obstructive sleep apnea) Symptoms seem to be improved.  She will continue CPAP.   Orders Placed This Encounter  Procedures  . DG Lumbar Spine Complete  Standing Status:   Future    Number of Occurrences:   1    Standing Expiration Date:   10/17/2019    Order Specific Question:   Reason for Exam (SYMPTOM  OR DIAGNOSIS REQUIRED)    Answer:   right low back pain, radiates to right anterior leg    Order Specific Question:   Is patient pregnant?    Answer:   No    Order Specific Question:   Preferred imaging location?    Answer:   [facility] Specific Question:   Radiology Contrast Protocol - do NOT  remove file path    Answer:   \\[email or web address]s.pdf  . Urine Microalbumin w/creat. ratio  . HgB A1c  . Basic Metabolic Panel (BMET)  . POCT urine pregnancy    No orders of the defined types were placed in this encounter.    [name], MD [place]

## 2018-08-16 NOTE — Assessment & Plan Note (Signed)
Home blood pressures indicate hypertension.  We will check kidney function and electrolytes and then determine medication.

## 2018-08-16 NOTE — Patient Instructions (Signed)
Nice to see you. We will check lab work today and then determine what blood pressure medication to put you on. We will get an x-ray of your back and contact you with the results.

## 2018-08-16 NOTE — Assessment & Plan Note (Addendum)
Chronic issue.  Sounds as though she may have some sciatica.  We will check an x-ray today.  May need an MRI or referral to spine specialist.

## 2018-08-16 NOTE — Assessment & Plan Note (Signed)
Symptoms seem to be improved.  She will continue CPAP.

## 2018-08-16 NOTE — Assessment & Plan Note (Signed)
Patient reports she has seen dermatology.

## 2018-08-22 ENCOUNTER — Other Ambulatory Visit: Payer: Self-pay | Admitting: Family Medicine

## 2018-08-22 DIAGNOSIS — M5441 Lumbago with sciatica, right side: Principal | ICD-10-CM

## 2018-08-22 DIAGNOSIS — G8929 Other chronic pain: Secondary | ICD-10-CM

## 2018-08-26 DIAGNOSIS — L565 Disseminated superficial actinic porokeratosis (DSAP): Secondary | ICD-10-CM | POA: Diagnosis not present

## 2018-08-28 ENCOUNTER — Telehealth: Payer: Self-pay | Admitting: Family Medicine

## 2018-08-28 NOTE — Telephone Encounter (Signed)
Copied from [place] [id number]. Topic: Referral - Request >> Aug 28, 2018 11:09 AM Brittany Santos wrote: Pt requesting referral Brittany Santos at [facility] ph# [phone] for acid reflux follow up to refill pantoprazole. She was seeing [facility] and has been having issues with the office. She is wanting to change services. Pt used to see Brittany Santos but he moved and is now back in the area. Pt tried to make appt with Brittany Santos and was advised she needs referral from PCP. Can Dr. Caryl Santos refill pantoprazole for her? Please advise.

## 2018-08-30 ENCOUNTER — Inpatient Hospital Stay: Admission: RE | Admit: 2018-08-30 | Payer: BLUE CROSS/BLUE SHIELD | Source: Ambulatory Visit

## 2018-09-03 ENCOUNTER — Telehealth: Payer: Self-pay

## 2018-09-03 DIAGNOSIS — K219 Gastro-esophageal reflux disease without esophagitis: Secondary | ICD-10-CM

## 2018-09-03 MED ORDER — PANTOPRAZOLE SODIUM 40 MG PO TBEC: 40.0000 mg | DELAYED_RELEASE_TABLET | Freq: Every day | ORAL | 0 refills | Status: DC

## 2018-09-03 NOTE — Telephone Encounter (Signed)
Sent to PCP to place referral  

## 2018-09-03 NOTE — Telephone Encounter (Signed)
Copied from [place] [id number]. Topic: Referral - Request >> Aug 28, 2018 11:09 AM Margot Ables wrote: Pt requesting referral Dr. Allen Norris at [facility] ph# [phone] for acid reflux follow up to refill pantoprazole. She was seeing [facility] and has been having issues with the office. She is wanting to change services. Pt used to see Dr. Allen Norris but he moved and is now back in the area. Pt tried to make appt with Dr. Allen Norris and was advised she needs referral from PCP. Can Dr. Caryl Bis refill pantoprazole for her? Please advise. >> Sep 02, 2018 12:06 PM Carolyn Stare wrote:   Pt call to follow up on her req for a referral to see her Gi

## 2018-09-03 NOTE — Telephone Encounter (Signed)
Referral placed.  I am happy to refill her Protonix.  Please confirm the dose with her.  Please see if she has had any new symptoms with her reflux as we would need to see her in the office for that.  Thanks.

## 2018-09-03 NOTE — Addendum Note (Signed)
Addended by: Leone Haven on: 09/03/2018 02:15 PM   Modules accepted: Orders

## 2018-09-03 NOTE — Telephone Encounter (Signed)
See phone note from today.

## 2018-09-03 NOTE — Telephone Encounter (Signed)
Called and spoke with pt she stated that she does NOT need a refill at this time she has a month supply and hopefully she will be seeing the GI doctor soon and they can refill the prescription. Pt is taking 40 MG of Protonix. Will reach out to [place] if a refill is needed.

## 2018-09-03 NOTE — Addendum Note (Signed)
Addended by: Myriam Forehand on: 09/03/2018 03:11 PM   Modules accepted: Orders

## 2018-09-07 ENCOUNTER — Ambulatory Visit
Admission: RE | Admit: 2018-09-07 | Discharge: 2018-09-07 | Disposition: A | Discharge status: Home / Self Care | Payer: BLUE CROSS/BLUE SHIELD | Source: Ambulatory Visit | Attending: Family Medicine | Admitting: Family Medicine

## 2018-09-07 DIAGNOSIS — M48061 Spinal stenosis, lumbar region without neurogenic claudication: Secondary | ICD-10-CM | POA: Diagnosis not present

## 2018-09-07 DIAGNOSIS — G8929 Other chronic pain: Secondary | ICD-10-CM

## 2018-09-07 DIAGNOSIS — M5441 Lumbago with sciatica, right side: Principal | ICD-10-CM

## 2018-09-11 DIAGNOSIS — G4733 Obstructive sleep apnea (adult) (pediatric): Secondary | ICD-10-CM | POA: Diagnosis not present

## 2018-09-12 ENCOUNTER — Telehealth: Payer: Self-pay

## 2018-09-12 ENCOUNTER — Other Ambulatory Visit: Payer: Self-pay | Admitting: Family Medicine

## 2018-09-12 DIAGNOSIS — M5136 Other intervertebral disc degeneration, lumbar region: Secondary | ICD-10-CM

## 2018-09-12 NOTE — Telephone Encounter (Signed)
Copied from [place] [id number]. Topic: Referral - Request >> Sep 12, 2018  3:50 PM Yvette Rack wrote: Reason for CRM: Pt states she needs the referral for Dr. Eustace Moore at [facility] sent to fax# [phone].

## 2018-09-21 DIAGNOSIS — Z23 Encounter for immunization: Secondary | ICD-10-CM | POA: Diagnosis not present

## 2018-10-07 DIAGNOSIS — M25551 Pain in right hip: Secondary | ICD-10-CM | POA: Diagnosis not present

## 2018-10-11 DIAGNOSIS — G4733 Obstructive sleep apnea (adult) (pediatric): Secondary | ICD-10-CM | POA: Diagnosis not present

## 2018-10-22 ENCOUNTER — Encounter: Payer: Self-pay | Admitting: Gastroenterology

## 2018-10-22 ENCOUNTER — Ambulatory Visit (INDEPENDENT_AMBULATORY_CARE_PROVIDER_SITE_OTHER): Payer: BLUE CROSS/BLUE SHIELD | Admitting: Gastroenterology

## 2018-10-22 ENCOUNTER — Ambulatory Visit: Payer: BLUE CROSS/BLUE SHIELD | Admitting: Gastroenterology

## 2018-10-22 VITALS — BP 158/77 | HR 120 | Ht 64.5 in | Wt 240.4 lb

## 2018-10-22 DIAGNOSIS — K219 Gastro-esophageal reflux disease without esophagitis: Secondary | ICD-10-CM | POA: Diagnosis not present

## 2018-10-22 MED ORDER — PANTOPRAZOLE SODIUM 40 MG PO TBEC: 40.0000 mg | DELAYED_RELEASE_TABLET | Freq: Two times a day (BID) | ORAL | 11 refills | Status: DC

## 2018-10-22 NOTE — Progress Notes (Signed)
Gastroenterology Consultation  Referring Provider:     Leone Haven, MD Primary Care Physician:  Leone Haven, MD Primary Gastroenterologist:  Dr. Allen Norris     Reason for Consultation:     Reflux        HPI:   Brittany Santos is a 57 y.o. y/o female referred for consultation & management of reflux by Dr. Caryl Bis, Angela Adam, MD.  This patient comes in today with a long-standing history of reflux.  The patient had an upper endoscopy by Dr. Gustavo Lah in May 2017 and a colonoscopy in 2016 and 2017.  In 2016 the patient had multiple adenomatous polyps.  These were also done by Dr. Gustavo Lah.  The patient's EGD in 2017 had biopsies taken that showed:  DIAGNOSIS:  A. STOMACH, ANTRUM; COLD BIOPSY:  - MILD REACTIVE GASTROPATHY.  - NEGATIVE FOR ACTIVE INFLAMMATION, INTESTINAL METAPLASIA, DYSPLASIA,  AND MALIGNANCY.  - NEGATIVE FOR HELICOBACTER PYLORI IN HEMATOXYLIN AND EOSIN SECTIONS.   B. STOMACH, BODY; COLD BIOPSY:  - OXYNTIC MUCOSA WITH PROTON PUMP INHIBITOR EFFECT.  - NEGATIVE FOR INTESTINAL METAPLASIA, DYSPLASIA, MALIGNANCY, AND H  PYLORI.   C. GASTRIC POLYPS; COLD BIOPSY:  - FUNDIC GLAND POLYPS, 4 FRAGMENTS.  - NEGATIVE FOR DYSPLASIA AND MALIGNANCY.   D. GASTROESOPHAGEAL JUNCTION; COLD BIOPSY:  - SQUAMOCOLUMNAR MUCOSA WITH MILD CHRONIC INFLAMMATION.  - NEGATIVE FOR GOBLET CELLS, DYSPLASIA, AND MALIGNANCY.   The patient has been treated with Protonix for her heartburn in the past.  The patient reports that she was having trouble making an appoint with her gastroenterologist, Dr. Gustavo Lah at [facility] and had seen me many years ago and wanted to switch her care back to me. The patient had a history of colon polyps in 2016.  The following year the patient had a colonoscopy with a single hyperplastic polyp.  Past Medical History:  Diagnosis Date  . Allergy   . Arthritis   . Diabetes mellitus without complication ([place])   . GERD (gastroesophageal reflux disease)     . History of colon polyps   . History of diverticulitis   . History of kidney stones 04/15/15    Past Surgical History:  Procedure Laterality Date  . BILATERAL CARPAL TUNNEL RELEASE    . BREAST SURGERY    . cervical neck fusion    . CHOLECYSTECTOMY    . ESOPHAGOGASTRODUODENOSCOPY (EGD) WITH PROPOFOL N/A 05/12/2016   Procedure: ESOPHAGOGASTRODUODENOSCOPY (EGD) WITH PROPOFOL;  Surgeon: Lollie Sails, MD;  Location: [facility] ENDOSCOPY;  Service: Endoscopy;  Laterality: N/A;    Prior to Admission medications   Medication Sig Start Date End Date Taking? Authorizing Provider  BIOTIN PO Take 1 tablet by mouth daily.    [provider]  Calcium-Magnesium-Vitamin D (CALCIUM 500 PO) Take by mouth.    [provider]  cetirizine (ZYRTEC) 10 MG tablet Take 10 mg by mouth daily.    [provider]  diclofenac (VOLTAREN) 75 MG EC tablet  11/13/16   [provider]  glucose blood test strip Use as instructed Dx: E11.9 09/29/15   Rubbie Battiest, RN  Lancet Devices (ONE TOUCH DELICA LANCING DEV) MISC Use as directed Dx: E11.9 09/29/15   Rubbie Battiest, RN  metFORMIN (GLUCOPHAGE) 500 MG tablet Take 2 tablets (1,000 mg total) by mouth 2 (two) times daily with a meal. 04/26/18   Leone Haven, MD  Multiple Vitamin (MULTIVITAMIN) capsule Take 1 capsule by mouth daily.    [provider]  pantoprazole (  PROTONIX) 40 MG tablet Take 1 tablet (40 mg total) by mouth daily. 09/03/18   Leone Haven, MD  progesterone (PROMETRIUM) 100 MG capsule TAKE 1 CAPSULE EVERY DAY 10/02/17   Rexene Agent, CNM  pseudoephedrine (SUDAFED) 120 MG 12 hr tablet Take 120 mg by mouth 2 (two) times daily.    [provider]  vitamin B-12 (CYANOCOBALAMIN) 100 MCG tablet Take 100 mcg by mouth daily.    [provider]    Family History  Problem Relation Age of Onset  . Hypertension Brother   . Breast cancer Mother   . Cancer Sister 80       Blood cancer  (unknown)  . Cancer Maternal Aunt        Lung  . Cancer Maternal Uncle 30       kidney  . Heart attack Paternal Uncle   . Aneurysm Paternal Grandmother   . Diabetes Neg Hx      Social History   Tobacco Use  . Smoking status: Never Smoker  . Smokeless tobacco: Never Used  Substance Use Topics  . Alcohol use: No    Alcohol/week: 0.0 standard drinks  . Drug use: No    Allergies as of 10/22/2018 - Review Complete 08/16/2018  Allergen Reaction Noted  . Vicodin [hydrocodone-acetaminophen] Itching and Rash 01/20/2015    Review of Systems:    All systems reviewed and negative except where noted in HPI.   Physical Exam:  LMP  (LMP Unknown) Comment: Patient states she had her "uterus scraped" sometime in the past 10 years.  No LMP recorded (lmp unknown). Patient is postmenopausal. General:   Alert,  Well-developed, well-nourished, pleasant and cooperative in NAD Head:  Normocephalic and atraumatic. Eyes:  Sclera clear, no icterus.   Conjunctiva pink. Ears:  Normal auditory acuity. Nose:  No deformity, discharge, or lesions. Mouth:  No deformity or lesions,oropharynx pink & moist. Neck:  Supple; no masses or thyromegaly. Lungs:  Respirations even and unlabored.  Clear throughout to auscultation.   No wheezes, crackles, or rhonchi. No acute distress. Heart:  Regular rate and rhythm; no murmurs, clicks, rubs, or gallops. Abdomen:  Normal bowel sounds.  No bruits.  Soft, non-tender and non-distended without masses, hepatosplenomegaly or hernias noted.  No guarding or rebound tenderness.  Negative Carnett sign.   Rectal:  Deferred.  Msk:  Symmetrical without gross deformities.  Good, equal movement & strength bilaterally. Pulses:  Normal pulses noted. Extremities:  No clubbing or edema.  No cyanosis. Neurologic:  Alert and oriented x3;  grossly normal neurologically. Skin:  Intact without significant lesions or rashes.  No jaundice. Lymph Nodes:  No significant cervical  adenopathy. Psych:  Alert and cooperative. Normal mood and affect.  Imaging Studies: No results found.  Assessment and Plan:   Brittany Santos is a 57 y.o. y/o female who has a history of reflux and is in need of a refill of her medications.  The patient will have her pantoprazole refilled.  The patient has also been told that she will need a repeat colonoscopy due to her history of polyps.  The patient's repeat colonoscopy will need to be done in 2021.  The patient has been explained the plan and has been told to follow-up with me if she has any further problems.  [name], MD. [facility]    Note: This dictation was prepared with Dragon dictation along with smaller phrase technology. Any transcriptional errors that result from this process are unintentional.

## 2018-10-28 ENCOUNTER — Telehealth (INDEPENDENT_AMBULATORY_CARE_PROVIDER_SITE_OTHER): Payer: Self-pay | Admitting: Orthopaedic Surgery

## 2018-10-28 ENCOUNTER — Other Ambulatory Visit: Payer: Self-pay | Admitting: Maternal Newborn

## 2018-10-28 NOTE — Telephone Encounter (Signed)
I tried multiple times since 11/8, faxing records to Dr. Nilsa Nutting. Today I mailed copy of the records per address on the authorization form patient completed.

## 2018-11-01 DIAGNOSIS — M1712 Unilateral primary osteoarthritis, left knee: Secondary | ICD-10-CM | POA: Diagnosis not present

## 2018-11-04 ENCOUNTER — Ambulatory Visit (INDEPENDENT_AMBULATORY_CARE_PROVIDER_SITE_OTHER): Payer: BLUE CROSS/BLUE SHIELD | Admitting: Obstetrics and Gynecology

## 2018-11-04 ENCOUNTER — Encounter: Payer: Self-pay | Admitting: Obstetrics and Gynecology

## 2018-11-04 VITALS — BP 134/80 | HR 87 | Ht 64.0 in | Wt 235.0 lb

## 2018-11-04 DIAGNOSIS — Z1231 Encounter for screening mammogram for malignant neoplasm of breast: Secondary | ICD-10-CM

## 2018-11-04 DIAGNOSIS — Z0000 Encounter for general adult medical examination without abnormal findings: Secondary | ICD-10-CM

## 2018-11-04 DIAGNOSIS — Z01419 Encounter for gynecological examination (general) (routine) without abnormal findings: Secondary | ICD-10-CM

## 2018-11-04 NOTE — Progress Notes (Signed)
Gynecology Annual Exam  PCP: Leone Haven, MD  Chief Complaint:  Chief Complaint  Patient presents with  . Gynecologic Exam    History of Present Illness:Patient is a 57 y.o. G0P0000 presents for annual exam. The patient has no complaints today.   LMP: No LMP recorded (lmp unknown). Patient is postmenopausal. MeReview of Systems: ROS  Past Medical History:  Past Medical History:  Diagnosis Date  . Allergy   . Arthritis   . Diabetes mellitus without complication ([place])   . GERD (gastroesophageal reflux disease)   . History of colon polyps   . History of diverticulitis   . History of kidney stones 04/15/15    Past Surgical History:  Past Surgical History:  Procedure Laterality Date  . BILATERAL CARPAL TUNNEL RELEASE    . BREAST SURGERY    . cervical neck fusion    . CHOLECYSTECTOMY    . ESOPHAGOGASTRODUODENOSCOPY (EGD) WITH PROPOFOL N/A 05/12/2016   Procedure: ESOPHAGOGASTRODUODENOSCOPY (EGD) WITH PROPOFOL;  Surgeon: Lollie Sails, MD;  Location: [facility] ENDOSCOPY;  Service: Endoscopy;  Laterality: N/A;    Gynecologic History:  No LMP recorded (lmp unknown). Patient is postmenopausal. Obstetric History: G0P0000  Family History:  Family History  Problem Relation Age of Onset  . Hypertension Brother   . Breast cancer Mother   . Cancer Sister 55       Blood cancer (unknown)  . Cancer Maternal Aunt        Lung  . Cancer Maternal Uncle 30       kidney  . Heart attack Paternal Uncle   . Aneurysm Paternal Grandmother   . Diabetes Neg Hx     Social History:  Social History   Socioeconomic History  . Marital status: Single    Spouse name: Not on file  . Number of children: Not on file  . Years of education: Not on file  . Highest education level: Not on file  Occupational History  . Not on file  Social Needs  . Financial resource strain: Not on file  . Food insecurity:    Worry: Not on file    Inability: Not on file  . Transportation needs:     Medical: Not on file    Non-medical: Not on file  Tobacco Use  . Smoking status: Never Smoker  . Smokeless tobacco: Never Used  Substance and Sexual Activity  . Alcohol use: No    Alcohol/week: 0.0 standard drinks  . Drug use: No  . Sexual activity: Not Currently    Birth control/protection: Post-menopausal  Lifestyle  . Physical activity:    Days per week: Not on file    Minutes per session: Not on file  . Stress: Not on file  Relationships  . Social connections:    Talks on phone: Not on file    Gets together: Not on file    Attends religious service: Not on file    Active member of club or organization: Not on file    Attends meetings of clubs or organizations: Not on file    Relationship status: Not on file  . Intimate partner violence:    Fear of current or ex partner: Not on file    Emotionally abused: Not on file    Physically abused: Not on file    Forced sexual activity: Not on file  Other Topics Concern  . Not on file  Social History Narrative   Works- [employer]    Mother  lives with pt   No children    1 dog lives inside    Right handed    Caffeine- 1 piece chocolate occasionally    Associate degree   Enjoys- gardening and going to concerts     Allergies:  Allergies  Allergen Reactions  . Vicodin [Hydrocodone-Acetaminophen] Itching and Rash    Medications: Prior to Admission medications   Medication Sig Start Date End Date Taking? Authorizing Provider  BIOTIN PO Take 1 tablet by mouth daily.   Yes [provider]  Calcium-Magnesium-Vitamin D (CALCIUM 500 PO) Take by mouth.   Yes [provider]  cetirizine (ZYRTEC) 10 MG tablet Take 10 mg by mouth daily.   Yes [provider]  diclofenac (VOLTAREN) 75 MG EC tablet  11/13/16  Yes [provider]  glucose blood test strip Use as instructed Dx: E11.9 09/29/15  Yes Doss, Velora Heckler, RN  Lancet Devices (ONE TOUCH DELICA LANCING DEV) MISC Use as directed Dx:  E11.9 09/29/15  Yes Doss, Velora Heckler, RN  metFORMIN (GLUCOPHAGE) 500 MG tablet Take 2 tablets (1,000 mg total) by mouth 2 (two) times daily with a meal. 04/26/18  Yes Leone Haven, MD  Multiple Vitamin (MULTIVITAMIN) capsule Take 1 capsule by mouth daily.   Yes [provider]  pantoprazole (PROTONIX) 40 MG tablet Take 1 tablet (40 mg total) by mouth 2 (two) times daily. 10/22/18  Yes [name], MD  progesterone (PROMETRIUM) 100 MG capsule TAKE 1 CAPSULE EVERY DAY 10/02/17  Yes Rexene Agent, CNM  pseudoephedrine (SUDAFED) 120 MG 12 hr tablet Take 120 mg by mouth 2 (two) times daily.   Yes [provider]  traMADol (ULTRAM) 50 MG tablet Take 50 mg by mouth every 6 (six) hours as needed.   Yes [provider]  vitamin B-12 (CYANOCOBALAMIN) 100 MCG tablet Take 100 mcg by mouth daily.   Yes [provider]    Physical Exam Vitals: Blood pressure 134/80, pulse 87, height 5\' 4"  (1.626 m), weight 235 lb (106.6 kg).  General: NAD HEENT: normocephalic, anicteric Thyroid: no enlargement, no palpable nodules Pulmonary: No increased work of breathing, CTAB Cardiovascular: RRR, distal pulses 2+ Breast: Breast symmetrical, no tenderness, no palpable nodules or masses, no skin or nipple retraction present, no nipple discharge.  No axillary or supraclavicular lymphadenopathy. Abdomen: NABS, soft, non-tender, non-distended.  Umbilicus without lesions.  No hepatomegaly, splenomegaly or masses palpable. No evidence of hernia  Genitourinary:  External: Normal external female genitalia.  Normal urethral meatus, normal Bartholin's and Skene's glands.    Vagina: Normal vaginal mucosa, no evidence of prolapse.    Cervix: Grossly normal in appearance, no bleeding  Uterus: Non-enlarged, mobile, normal contour.  No CMT  Adnexa: ovaries non-enlarged, no adnexal masses  Rectal: deferred  Lymphatic: no evidence of inguinal lymphadenopathy Extremities: no edema, erythema,  or tenderness Neurologic: Grossly intact Psychiatric: mood appropriate, affect full  Female chaperone present for pelvic and breast  portions of the physical exam     Assessment: 57 y.o. G0P0000 routine annual exam  Plan: Problem List Items Addressed This Visit    None    Visit Diagnoses    Screening mammogram, encounter for    -  Primary   Relevant Orders   MM DIGITAL SCREENING BILATERAL   Visit for pelvic exam       Health care maintenance          1) Mammogram - recommend yearly screening mammogram.  Mammogram Was ordered today  2) STI screening  wasoffered and declined  3) ASCCP guidelines and rational discussed.  Patient opts for every 5 years screening interval  4)  Routine healthcare maintenance including cholesterol, diabetes screening discussed managed by PCP  6) Colonoscopy encouraged.  7) Return in about 1 year (around 11/05/2019) for annual.    Adrian Prows MD [place] 11/04/18 4:25 PM

## 2018-11-05 ENCOUNTER — Other Ambulatory Visit: Payer: Self-pay | Admitting: Maternal Newborn

## 2018-11-06 ENCOUNTER — Telehealth: Payer: Self-pay

## 2018-11-06 NOTE — Telephone Encounter (Signed)
Pt calling requesting that [facility]. States she has been without it for a week now. Please advise

## 2018-11-07 NOTE — Telephone Encounter (Signed)
Patient is calling to find out when her prescription will be at the pharmacy. Patient states she has been with out her Progestrone for a week now. Please advise

## 2018-11-11 DIAGNOSIS — G4733 Obstructive sleep apnea (adult) (pediatric): Secondary | ICD-10-CM | POA: Diagnosis not present

## 2018-11-13 ENCOUNTER — Encounter: Payer: Self-pay | Admitting: Obstetrics and Gynecology

## 2018-12-11 DIAGNOSIS — G4733 Obstructive sleep apnea (adult) (pediatric): Secondary | ICD-10-CM | POA: Diagnosis not present

## 2018-12-16 ENCOUNTER — Ambulatory Visit (INDEPENDENT_AMBULATORY_CARE_PROVIDER_SITE_OTHER): Payer: BLUE CROSS/BLUE SHIELD | Admitting: Family Medicine

## 2018-12-16 ENCOUNTER — Telehealth: Payer: Self-pay | Admitting: Family Medicine

## 2018-12-16 ENCOUNTER — Encounter: Payer: Self-pay | Admitting: Family Medicine

## 2018-12-16 ENCOUNTER — Encounter (INDEPENDENT_AMBULATORY_CARE_PROVIDER_SITE_OTHER): Payer: Self-pay

## 2018-12-16 VITALS — BP 130/76 | HR 82 | Temp 97.6°F | Ht 64.0 in | Wt 235.6 lb

## 2018-12-16 DIAGNOSIS — K219 Gastro-esophageal reflux disease without esophagitis: Secondary | ICD-10-CM

## 2018-12-16 DIAGNOSIS — J019 Acute sinusitis, unspecified: Secondary | ICD-10-CM | POA: Diagnosis not present

## 2018-12-16 DIAGNOSIS — I10 Essential (primary) hypertension: Secondary | ICD-10-CM

## 2018-12-16 DIAGNOSIS — E119 Type 2 diabetes mellitus without complications: Secondary | ICD-10-CM | POA: Diagnosis not present

## 2018-12-16 LAB — POCT GLYCOSYLATED HEMOGLOBIN (HGB A1C): HEMOGLOBIN A1C: 6.7 % — AB (ref 4.0–5.6)

## 2018-12-16 MED ORDER — AMOXICILLIN-POT CLAVULANATE 875-125 MG PO TABS: 1.0000 | ORAL_TABLET | Freq: Two times a day (BID) | ORAL | 0 refills | Status: DC

## 2018-12-16 NOTE — Patient Instructions (Addendum)
Nice to see you. You likely have a sinus infection.  We will treat you with Augmentin.  Please start on Flonase.  Please try to limit your Sudafed use. Please start monitoring your blood pressure at home.  Your goal is 130/80 or less. We will contact you with your A1c result.

## 2018-12-16 NOTE — Progress Notes (Signed)
[name], MD Phone: [phone]  Brittany Santos is a 57 y.o. female who presents today for follow-up.  CC: Diabetes, sinusitis, GERD  Diabetes: Typically running 135-150.  Taking metformin though sometimes only takes 1 tablet daily.  She does get some GI upset if she takes too much of it.  No polyuria or polydipsia.  No hypoglycemia.  Patient does report she started using 2 drops of CBD oil.  Sinusitis: Patient notes this started about 3 weeks ago.  She has rhinorrhea with maxillary sinus congestion.  Congestion is worse in the morning.  Is not improving.  She is blowing clear mucus out of her nose.  She is coughing up clear mucus.  No fevers.  Pseudoephedrine has been helpful at bedtime for her congestion.  She is taking Zyrtec.  She is not taking Flonase.  GERD: Taking Protonix twice daily.  She tries to avoid the foods that trigger her reflux.  She notes no reflux symptoms with Protonix.  No abdominal pain, blood in her stool, or dysphagia.  She does not check her blood pressure at home.  She has been taking pseudoephedrine.  Social History   Tobacco Use  Smoking Status Never Smoker  Smokeless Tobacco Never Used     ROS see history of present illness  Objective  Physical Exam Vitals:   12/16/18 0824 12/16/18 0855  BP: 140/78 130/76  Pulse: 82   Temp: 97.6 F (36.4 C)   SpO2: 98%     BP Readings from Last 3 Encounters:  12/16/18 130/76  11/04/18 134/80  10/22/18 (!) 158/77   Wt Readings from Last 3 Encounters:  12/16/18 235 lb 9.6 oz (106.9 kg)  11/04/18 235 lb (106.6 kg)  10/22/18 240 lb 6.4 oz (109 kg)    Physical Exam Constitutional:      General: She is not in acute distress.    Appearance: She is not diaphoretic.  HENT:     Right Ear: Tympanic membrane normal.     Left Ear: Tympanic membrane normal.     Mouth/Throat:     Mouth: Mucous membranes are moist.     Pharynx: Oropharynx is clear.  Eyes:     Conjunctiva/sclera: Conjunctivae  normal.     Pupils: Pupils are equal, round, and reactive to light.  Cardiovascular:     Rate and Rhythm: Normal rate and regular rhythm.     Heart sounds: Normal heart sounds.  Pulmonary:     Effort: Pulmonary effort is normal.     Breath sounds: Normal breath sounds.  Lymphadenopathy:     Cervical: No cervical adenopathy.  Skin:    General: Skin is warm and dry.  Neurological:     Mental Status: She is alert.      Assessment/Plan: Please see individual problem list.  Hypertension Improved on recheck.  Discussed having her check it at home and she will contact us in 2 weeks with readings.  Discussed goal of 130/80 or less.  GERD (gastroesophageal reflux disease) Stable on Protonix.  She will continue this.  Type 2 diabetes mellitus Seems to be adequately controlled.  Check A1c.  Continue metformin.  She reports having had a pneumonia vaccine last year.  We will have the [place] contact the pharmacy to check on this.  Sinusitis Symptoms likely related to sinusitis.  Given duration will cover with Augmentin.  She will start on Flonase.  Discussed limiting use of Sudafed.  If not improving she will let us know.  Counseled against using CBD  oil given that it is not regulated and it is difficult to know how it would interact with her other medications.  Advised if she is going to continue to use this she needs to monitor for any adverse effects.  Health Maintenance: CMA will contact the patient's pharmacy to confirm her Pneumovax status.  Orders Placed This Encounter  Procedures  . POCT HgB A1C    Meds ordered this encounter  Medications  . amoxicillin-clavulanate (AUGMENTIN) 875-125 MG tablet    Sig: Take 1 tablet by mouth 2 (two) times daily.    Dispense:  14 tablet    Refill:  0     [name], MD [place]

## 2018-12-16 NOTE — Telephone Encounter (Signed)
Patient reported she had a [facility]  Can you call them to confirm this and update her chart?  Thanks.

## 2018-12-16 NOTE — Assessment & Plan Note (Signed)
Improved on recheck.  Discussed having her check it at home and she will contact us in 2 weeks with readings.  Discussed goal of 130/80 or less.

## 2018-12-16 NOTE — Assessment & Plan Note (Signed)
Stable on Protonix.  She will continue this.

## 2018-12-16 NOTE — Assessment & Plan Note (Signed)
Symptoms likely related to sinusitis.  Given duration will cover with Augmentin.  She will start on Flonase.  Discussed limiting use of Sudafed.  If not improving she will let us know.

## 2018-12-16 NOTE — Assessment & Plan Note (Signed)
Seems to be adequately controlled.  Check A1c.  Continue metformin.  She reports having had a pneumonia vaccine last year.  We will have the [facility] contact the pharmacy to check on this.

## 2018-12-17 MED ORDER — PNEUMOCOCCAL VAC POLYVALENT 25 MCG/0.5ML IJ INJ: 0.5000 mL | INJECTION | Freq: Once | INTRAMUSCULAR | 0 refills | Status: AC

## 2018-12-17 NOTE — Telephone Encounter (Signed)
Called pt and left a VM. CRM created and sent to [facility] pool.

## 2018-12-17 NOTE — Telephone Encounter (Signed)
Updated in patient chart sent to PCP as an FYI.

## 2018-12-17 NOTE — Telephone Encounter (Signed)
Pt would like Rx to be sent to [facility] for vaccination. Thanks

## 2018-12-17 NOTE — Telephone Encounter (Signed)
Called [facility] pt received [id number] 10-09-2016

## 2018-12-17 NOTE — Telephone Encounter (Signed)
Noted.  The patient should have the Pneumovax pneumonia vaccine as well.  If she is willing to do this we can send a prescription to her pharmacy for her to complete this.  This is to help prevent pneumonia given her history of diabetes placing her at increased risk for pneumonia.

## 2018-12-17 NOTE — Telephone Encounter (Signed)
Sent to pharmacy 

## 2018-12-17 NOTE — Addendum Note (Signed)
Addended by: Leone Haven on: 12/17/2018 04:42 PM   Modules accepted: Orders

## 2018-12-19 NOTE — Telephone Encounter (Signed)
Called and spoke with patient. Pt advised and voiced understanding.  

## 2018-12-23 ENCOUNTER — Other Ambulatory Visit: Payer: Self-pay

## 2018-12-23 ENCOUNTER — Telehealth: Payer: Self-pay | Admitting: Gastroenterology

## 2018-12-23 MED ORDER — PANTOPRAZOLE SODIUM 40 MG PO TBEC: 40.0000 mg | DELAYED_RELEASE_TABLET | Freq: Two times a day (BID) | ORAL | 5 refills | Status: DC

## 2018-12-23 NOTE — Telephone Encounter (Signed)
Pt is  Calling she needs a refill on rx Pantipazole [facility] in [place] she only has 2 days left

## 2018-12-23 NOTE — Telephone Encounter (Signed)
Rx for Pantoprazole refill has been sent to [facility].

## 2018-12-24 ENCOUNTER — Telehealth (INDEPENDENT_AMBULATORY_CARE_PROVIDER_SITE_OTHER): Payer: Self-pay | Admitting: Orthopaedic Surgery

## 2018-12-24 NOTE — Telephone Encounter (Signed)
Dnasiah from [facility] called needing X-rays for right hip and back from 2016 to present put on CD and sent via [employer]. Dnasiah said her [employer] account number is [phone]. The number to contact Mamie Laurel is [phone]

## 2018-12-25 NOTE — Telephone Encounter (Signed)
This has been taken care of. Request scanned in chart

## 2018-12-25 NOTE — Telephone Encounter (Signed)
I gave CD to Tammy to be sent.

## 2018-12-31 ENCOUNTER — Telehealth: Payer: Self-pay | Admitting: Family Medicine

## 2018-12-31 DIAGNOSIS — M1611 Unilateral primary osteoarthritis, right hip: Secondary | ICD-10-CM | POA: Diagnosis not present

## 2018-12-31 DIAGNOSIS — M76891 Other specified enthesopathies of right lower limb, excluding foot: Secondary | ICD-10-CM | POA: Diagnosis not present

## 2018-12-31 NOTE — Telephone Encounter (Signed)
Copied from [place] [phone]. Topic: Quick Communication - See Telephone Encounter >> Dec 31, 2018  3:01 PM R[place]ford Nail, [place] wrote: CRM for notification. See Telephone encounter for: 12/31/18. Patient calling and states that she will be going on a cruise in the middle of February 2020. States that she is going to [place] and the [place]. Would like to know if there are any vaccines that are recommended for this trip? CB#: [phone]

## 2019-01-02 NOTE — Telephone Encounter (Signed)
Called and spoke with patient. Pt advised and voiced understanding.  

## 2019-01-02 NOTE — Telephone Encounter (Signed)
There are several vaccines that are recommended.  Also depending on where she is going in [place] she could need malaria prophylaxis.  I would advise that she contact the [facility] to set up an appointment for them to give her the necessary vaccines and discuss other needs for her trip.

## 2019-01-02 NOTE — Telephone Encounter (Signed)
Sent to PCP to advise 

## 2019-01-10 DIAGNOSIS — M25551 Pain in right hip: Secondary | ICD-10-CM | POA: Diagnosis not present

## 2019-01-10 DIAGNOSIS — R262 Difficulty in walking, not elsewhere classified: Secondary | ICD-10-CM | POA: Diagnosis not present

## 2019-01-11 DIAGNOSIS — G4733 Obstructive sleep apnea (adult) (pediatric): Secondary | ICD-10-CM | POA: Diagnosis not present

## 2019-01-14 DIAGNOSIS — M25551 Pain in right hip: Secondary | ICD-10-CM | POA: Diagnosis not present

## 2019-01-14 DIAGNOSIS — R262 Difficulty in walking, not elsewhere classified: Secondary | ICD-10-CM | POA: Diagnosis not present

## 2019-01-16 DIAGNOSIS — R262 Difficulty in walking, not elsewhere classified: Secondary | ICD-10-CM | POA: Diagnosis not present

## 2019-01-16 DIAGNOSIS — M25551 Pain in right hip: Secondary | ICD-10-CM | POA: Diagnosis not present

## 2019-01-21 DIAGNOSIS — M25551 Pain in right hip: Secondary | ICD-10-CM | POA: Diagnosis not present

## 2019-01-21 DIAGNOSIS — R262 Difficulty in walking, not elsewhere classified: Secondary | ICD-10-CM | POA: Diagnosis not present

## 2019-01-22 DIAGNOSIS — B9689 Other specified bacterial agents as the cause of diseases classified elsewhere: Secondary | ICD-10-CM | POA: Diagnosis not present

## 2019-01-22 DIAGNOSIS — J019 Acute sinusitis, unspecified: Secondary | ICD-10-CM | POA: Diagnosis not present

## 2019-01-22 DIAGNOSIS — J209 Acute bronchitis, unspecified: Secondary | ICD-10-CM | POA: Diagnosis not present

## 2019-02-11 DIAGNOSIS — G4733 Obstructive sleep apnea (adult) (pediatric): Secondary | ICD-10-CM | POA: Diagnosis not present

## 2019-02-18 DIAGNOSIS — M25551 Pain in right hip: Secondary | ICD-10-CM | POA: Diagnosis not present

## 2019-02-18 DIAGNOSIS — R262 Difficulty in walking, not elsewhere classified: Secondary | ICD-10-CM | POA: Diagnosis not present

## 2019-02-20 DIAGNOSIS — R262 Difficulty in walking, not elsewhere classified: Secondary | ICD-10-CM | POA: Diagnosis not present

## 2019-02-20 DIAGNOSIS — M25551 Pain in right hip: Secondary | ICD-10-CM | POA: Diagnosis not present

## 2019-02-24 DIAGNOSIS — R262 Difficulty in walking, not elsewhere classified: Secondary | ICD-10-CM | POA: Diagnosis not present

## 2019-02-24 DIAGNOSIS — M25551 Pain in right hip: Secondary | ICD-10-CM | POA: Diagnosis not present

## 2019-02-28 DIAGNOSIS — M25551 Pain in right hip: Secondary | ICD-10-CM | POA: Diagnosis not present

## 2019-02-28 DIAGNOSIS — R262 Difficulty in walking, not elsewhere classified: Secondary | ICD-10-CM | POA: Diagnosis not present

## 2019-03-12 DIAGNOSIS — G4733 Obstructive sleep apnea (adult) (pediatric): Secondary | ICD-10-CM | POA: Diagnosis not present

## 2019-03-14 ENCOUNTER — Encounter: Payer: Self-pay | Admitting: Family Medicine

## 2019-03-14 ENCOUNTER — Other Ambulatory Visit: Payer: Self-pay

## 2019-03-14 ENCOUNTER — Telehealth: Payer: Self-pay

## 2019-03-14 ENCOUNTER — Ambulatory Visit (INDEPENDENT_AMBULATORY_CARE_PROVIDER_SITE_OTHER): Payer: BLUE CROSS/BLUE SHIELD | Admitting: Family Medicine

## 2019-03-14 DIAGNOSIS — J019 Acute sinusitis, unspecified: Secondary | ICD-10-CM | POA: Diagnosis not present

## 2019-03-14 MED ORDER — AMOXICILLIN-POT CLAVULANATE 875-125 MG PO TABS: 1.0000 | ORAL_TABLET | Freq: Two times a day (BID) | ORAL | 0 refills | Status: DC

## 2019-03-14 NOTE — Telephone Encounter (Signed)
Pt schedule for virtual visit

## 2019-03-14 NOTE — Telephone Encounter (Signed)
Please put this person on Dr. Ellen Henri schedule for 3:15 same day appt I will do everything to make it a virtual visit thanks

## 2019-03-14 NOTE — Telephone Encounter (Signed)
Called and spoke with pt. Pt confirmed that she got the email for the virtual visit   Brittany Santos

## 2019-03-14 NOTE — Telephone Encounter (Signed)
The patient has been scheduled at 3:15.

## 2019-03-14 NOTE — Assessment & Plan Note (Signed)
Symptoms are likely related to sinusitis.  I did discuss the potential for coronavirus causing her symptoms and did advise her to do a 14-day self-isolation.  She asked about going to pick up her cat from the vet and I advised that she should have somebody else do this.  I also advised that she should have somebody pick up her antibiotics from the pharmacy.  We will treat with Augmentin for presumed sinusitis.  Discussed that if she developed any worsening symptoms she should contact us.  Discussed that if she developed shortness of breath, cough productive of blood, or high fevers she needs to be evaluated in the emergency department.  She verbalized understanding.

## 2019-03-14 NOTE — Progress Notes (Signed)
This visit type was conducted due to national recommendations for restrictions regarding the COVID-19 pandemic (e.g. social distancing).  This format is felt to be most appropriate for this patient at this time.  All issues noted in this document were discussed and addressed.  No physical exam was performed (except for noted visual exam findings with Video Visits).    Virtual Visit via Video Note  I connected with Brittany Santos on 03/14/19 at  3:15 PM EDT by a video enabled telemedicine application and verified that I am speaking with the correct person using two identifiers. Location patient: home Location provider:work Persons participating in the virtual visit: patient, provider  I discussed the limitations of evaluation and management by telemedicine and the availability of in person appointments. The patient expressed understanding and agreed to proceed.  Chief complaint: Sinusitis  HPI: Sinusitis: Patient notes about a week of symptoms.  She has had some sinus pressure with clear nasal discharge.  She has had significant postnasal drip.  She notes some mild sore throat.  She notes in the morning she will cough up some white mucus if she has not taken Sudafed.  She notes no fever.  She has not been taking Tylenol or ibuprofen.  No shortness of breath.  She did travel in February on a cruise to the [place].  She did not fly.  That has been over a month ago.  No coronavirus exposures.  She has been taking Zyrtec.  She notes this feels like her typical sinus infection that she gets around this time of the year.   ROS: See pertinent positives and negatives per HPI.  Past Medical History:  Diagnosis Date  . Allergy   . Arthritis   . Diabetes mellitus without complication ([place])   . Family history of breast cancer    11/19 genetic testing letter sent  . GERD (gastroesophageal reflux disease)   . History of colon polyps   . History of diverticulitis   . History of kidney stones 04/15/15     Past Surgical History:  Procedure Laterality Date  . BILATERAL CARPAL TUNNEL RELEASE    . BREAST SURGERY    . cervical neck fusion    . CHOLECYSTECTOMY    . ESOPHAGOGASTRODUODENOSCOPY (EGD) WITH PROPOFOL N/A 05/12/2016   Procedure: ESOPHAGOGASTRODUODENOSCOPY (EGD) WITH PROPOFOL;  Surgeon: Lollie Sails, MD;  Location: [facility] ENDOSCOPY;  Service: Endoscopy;  Laterality: N/A;    Family History  Problem Relation Age of Onset  . Hypertension Brother   . Breast cancer Mother 35  . Cancer Sister 76       Blood cancer (unknown)  . Cancer Maternal Aunt        Lung  . Cancer Maternal Uncle 30       kidney  . Heart attack Paternal Uncle   . Aneurysm Paternal Grandmother   . Diabetes Neg Hx     SOCIAL HX: Non-smoker   Current Outpatient Medications:  .  BIOTIN PO, Take 1 tablet by mouth daily., Disp: , Rfl:  .  Calcium-Magnesium-Vitamin D (CALCIUM 500 PO), Take by mouth., Disp: , Rfl:  .  cetirizine (ZYRTEC) 10 MG tablet, Take 10 mg by mouth daily., Disp: , Rfl:  .  diclofenac (VOLTAREN) 75 MG EC tablet, , Disp: , Rfl:  .  glucose blood test strip, Use as instructed Dx: E11.9, Disp: 100 each, Rfl: 12 .  Lancet Devices (ONE TOUCH DELICA LANCING DEV) MISC, Use as directed Dx: E11.9, Disp: 1 each, Rfl: 0 .  metFORMIN (GLUCOPHAGE) 500 MG tablet, Take 2 tablets (1,000 mg total) by mouth 2 (two) times daily with a meal., Disp: 360 tablet, Rfl: 1 .  Multiple Vitamin (MULTIVITAMIN) capsule, Take 1 capsule by mouth daily., Disp: , Rfl:  .  pantoprazole (PROTONIX) 40 MG tablet, Take 1 tablet (40 mg total) by mouth 2 (two) times daily. **PLEASE SCHEDULE FOLLOW UP APPT**, Disp: 60 tablet, Rfl: 5 .  progesterone (PROMETRIUM) 100 MG capsule, TAKE 1 CAPSULE EVERY DAY, Disp: 90 capsule, Rfl: 4 .  pseudoephedrine (SUDAFED) 120 MG 12 hr tablet, Take 120 mg by mouth 2 (two) times daily., Disp: , Rfl:  .  traMADol (ULTRAM) 50 MG tablet, Take 50 mg by mouth every 6 (six) hours as needed., Disp: , Rfl:   .  vitamin B-12 (CYANOCOBALAMIN) 100 MCG tablet, Take 100 mcg by mouth daily., Disp: , Rfl:  .  amoxicillin-clavulanate (AUGMENTIN) 875-125 MG tablet, Take 1 tablet by mouth 2 (two) times daily., Disp: 14 tablet, Rfl: 0  EXAM:  VITALS per patient if applicable: None.  GENERAL: alert, oriented, appears well and in no acute distress  HEENT: atraumatic, conjunttiva clear, no obvious abnormalities on inspection of external nose and ears, examination of posterior oropharynx through the camera appears normal  NECK: normal movements of the head and neck  LUNGS: on inspection no signs of respiratory distress, breathing rate appears normal, no obvious gross SOB, gasping or wheezing  CV: no obvious cyanosis  MS: moves all visible extremities without noticeable abnormality  PSYCH/NEURO: pleasant and cooperative, no obvious depression or anxiety, speech and thought processing grossly intact  ASSESSMENT AND PLAN:  Discussed the following assessment and plan:  Acute non-recurrent sinusitis, unspecified location  Sinusitis Symptoms are likely related to sinusitis.  I did discuss the potential for coronavirus causing her symptoms and did advise her to do a 14-day self-isolation.  She asked about going to pick up her cat from the vet and I advised that she should have somebody else do this.  I also advised that she should have somebody pick up her antibiotics from the pharmacy.  We will treat with Augmentin for presumed sinusitis.  Discussed that if she developed any worsening symptoms she should contact us.  Discussed that if she developed shortness of breath, cough productive of blood, or high fevers she needs to be evaluated in the emergency department.  She verbalized understanding.     I discussed the assessment and treatment plan with the patient. The patient was provided an opportunity to ask questions and all were answered. The patient agreed with the plan and demonstrated an understanding  of the instructions.   The patient was advised to call back or seek an in-person evaluation if the symptoms worsen or if the condition fails to improve as anticipated.  I provided 12 minutes of non-face-to-face time during this encounter.   [name], MD

## 2019-03-14 NOTE — Telephone Encounter (Signed)
Pt called in requesting an antibiotic.  Pt stated that she began having allergy symptoms last week.  Pt said that she has these symptoms every year.  Pt believes that she might have a sinus infection.  Symptoms include clear sinus drainage, cough, and chest congestion.  Pt says that she is "throwing up" clear phlegm.  Pt denies fever and no SOB.  Pt says that she has been taking Sudafed for a week and is concerned that this could turn into pneumonia if she doesn't get it treated.  Pt said that she went a on a cruise to the [place] from Feb. 14-22.  Pt said that she drove to the port and did not fly.  No other travel.  Pt feel that her symptoms are seasonal and unrelated to travel.  Pt denies being around anyone else who has been sick.    Pt wanted it noted that she was on Clarithromycin in late Jan or early Feb and had side effects of nausea and bad taste.  Does not want to take this medicine again.  Pt is open to a virtual appt for today.  Would like the 3:15 pm.  Best call back number is cell # [phone].  Email for webex:  [email or web address]  Informed pt that Dr. Ellen Henri CMA will call her back to schedule virtual visit.  Pt said that she is working from home so she may not be able to answer phone right away.

## 2019-04-29 DIAGNOSIS — M5417 Radiculopathy, lumbosacral region: Secondary | ICD-10-CM | POA: Diagnosis not present

## 2019-04-30 ENCOUNTER — Encounter: Payer: Self-pay | Admitting: Family Medicine

## 2019-04-30 ENCOUNTER — Other Ambulatory Visit: Payer: Self-pay

## 2019-04-30 ENCOUNTER — Ambulatory Visit (INDEPENDENT_AMBULATORY_CARE_PROVIDER_SITE_OTHER): Payer: BLUE CROSS/BLUE SHIELD | Admitting: Family Medicine

## 2019-04-30 DIAGNOSIS — G4733 Obstructive sleep apnea (adult) (pediatric): Secondary | ICD-10-CM | POA: Diagnosis not present

## 2019-04-30 DIAGNOSIS — I10 Essential (primary) hypertension: Secondary | ICD-10-CM | POA: Diagnosis not present

## 2019-04-30 DIAGNOSIS — E119 Type 2 diabetes mellitus without complications: Secondary | ICD-10-CM | POA: Diagnosis not present

## 2019-04-30 DIAGNOSIS — M5441 Lumbago with sciatica, right side: Secondary | ICD-10-CM

## 2019-04-30 DIAGNOSIS — G8929 Other chronic pain: Secondary | ICD-10-CM

## 2019-04-30 NOTE — Progress Notes (Signed)
Virtual Visit via video Note  This visit type was conducted due to national recommendations for restrictions regarding the COVID-19 pandemic (e.g. social distancing).  This format is felt to be most appropriate for this patient at this time.  All issues noted in this document were discussed and addressed.  No physical exam was performed (except for noted visual exam findings with Video Visits).   I connected with Brittany Santos today at  8:30 AM EDT by a video enabled telemedicine application and verified that I am speaking with the correct person using two identifiers. Location patient: home Location provider: work Persons participating in the virtual visit: patient, provider  I discussed the limitations, risks, security and privacy concerns of performing an evaluation and management service by telephone and the availability of in person appointments. I also discussed with the patient that there may be a patient responsible charge related to this service. The patient expressed understanding and agreed to proceed.  Reason for visit: follow-up  HPI: HTN: notes this has been well controlled though does not remember any numbers. No CP, SOB, orthopnea, or PND. She does note minimal ankle edema that resolves over night.   OSA: wearing her CPAP for 6 hours each night. No hypersomnia. She does wak well rested.   DM: 180 this morning. Not checking consistently. Taking metformin. No polyuria or polydipsia. She has been eating more snack foods. No exercising due to leg pain.  Right leg pain: she has seen orthopedics and they feel this is radicular. She has been referred to another specialist for evaluation. She notes her legs have felt weak for years, though there has been no acute change. No incontinence. No numbness.    ROS: See pertinent positives and negatives per HPI.  Past Medical History:  Diagnosis Date  . Allergy   . Arthritis   . Diabetes mellitus without complication ([place])   .  Family history of breast cancer    11/19 genetic testing letter sent  . GERD (gastroesophageal reflux disease)   . History of colon polyps   . History of diverticulitis   . History of kidney stones 04/15/15    Past Surgical History:  Procedure Laterality Date  . BILATERAL CARPAL TUNNEL RELEASE    . BREAST SURGERY    . cervical neck fusion    . CHOLECYSTECTOMY    . ESOPHAGOGASTRODUODENOSCOPY (EGD) WITH PROPOFOL N/A 05/12/2016   Procedure: ESOPHAGOGASTRODUODENOSCOPY (EGD) WITH PROPOFOL;  Surgeon: Lollie Sails, MD;  Location[facility] ENDOSCOPY;  Service: Endoscopy;  Laterality: N/A;    Family History  Problem Relation Age of Onset  . Hypertension Brother   . Breast cancer Mother 64  . Cancer Sister 77       Blood cancer (unknown)  . Cancer Maternal Aunt        Lung  . Cancer Maternal Uncle 30       kidney  . Heart attack Paternal Uncle   . Aneurysm Paternal Grandmother   . Diabetes Neg Hx     SOCIAL HX: nonsmoker.   Current Outpatient Medications:  .  BIOTIN PO, Take 1 tablet by mouth daily., Disp: , Rfl:  .  Calcium-Magnesium-Vitamin D (CALCIUM 500 PO), Take by mouth., Disp: , Rfl:  .  cetirizine (ZYRTEC) 10 MG tablet, Take 10 mg by mouth daily., Disp: , Rfl:  .  diclofenac (VOLTAREN) 75 MG EC tablet, , Disp: , Rfl:  .  glucose blood test strip, Use as instructed Dx: E11.9, Disp: 100 each, Rfl: 12 .  metFORMIN (GLUCOPHAGE) 500 MG tablet, Take 2 tablets (1,000 mg total) by mouth 2 (two) times daily with a meal., Disp: 360 tablet, Rfl: 1 .  pantoprazole (PROTONIX) 40 MG tablet, Take 1 tablet (40 mg total) by mouth 2 (two) times daily. **PLEASE SCHEDULE FOLLOW UP APPT**, Disp: 60 tablet, Rfl: 5 .  progesterone (PROMETRIUM) 100 MG capsule, TAKE 1 CAPSULE EVERY DAY, Disp: 90 capsule, Rfl: 4 .  traMADol (ULTRAM) 50 MG tablet, Take 50 mg by mouth every 6 (six) hours as needed., Disp: , Rfl:  .  vitamin B-12 (CYANOCOBALAMIN) 100 MCG tablet, Take 100 mcg by mouth daily., Disp: ,  Rfl:  .  amoxicillin-clavulanate (AUGMENTIN) 875-125 MG tablet, Take 1 tablet by mouth 2 (two) times daily. (Patient not taking: Reported on 04/30/2019), Disp: 14 tablet, Rfl: 0 .  Lancet Devices (ONE TOUCH DELICA LANCING DEV) MISC, Use as directed Dx: E11.9 (Patient not taking: Reported on 04/30/2019), Disp: 1 each, Rfl: 0 .  Multiple Vitamin (MULTIVITAMIN) capsule, Take 1 capsule by mouth daily., Disp: , Rfl:  .  pseudoephedrine (SUDAFED) 120 MG 12 hr tablet, Take 120 mg by mouth 2 (two) times daily., Disp: , Rfl:   EXAM:  VITALS per patient if applicable: none  GENERAL: alert, oriented, appears well and in no acute distress  HEENT: atraumatic, conjunttiva clear, no obvious abnormalities on inspection of external nose and ears  NECK: normal movements of the head and neck  LUNGS: on inspection no signs of respiratory distress, breathing rate appears normal, no obvious gross SOB, gasping or wheezing  CV: no obvious cyanosis  MS: moves all visible extremities without noticeable abnormality  PSYCH/NEURO: pleasant and cooperative, no obvious depression or anxiety, speech and thought processing grossly intact  ASSESSMENT AND PLAN:  Discussed the following assessment and plan:  Essential hypertension - Plan: Lipid panel, Comp Met (CMET)  OSA (obstructive sleep apnea)  Type 2 diabetes mellitus without complication, without long-term current use of insulin (HCC) - Plan: Hemoglobin A1c  Chronic right-sided low back pain with right-sided sciatica  Hypertension Continue to monitor. She will come in for lab work.   OSA (obstructive sleep apnea) Stable. Continue CPAP. She is getting benefit from this.   Type 2 diabetes mellitus Continue metformin. Check labs.   Back pain, chronic Symptoms likely radicular. She will keep her appointment with her specialist. We will make a disc of her recent lumbar spine films.   CMA to contact patient to schedule follow-up in 4 months.  Social  distancing precautions and sick precautions given regarding COVID19.    I discussed the assessment and treatment plan with the patient. The patient was provided an opportunity to ask questions and all were answered. The patient agreed with the plan and demonstrated an understanding of the instructions.   The patient was advised to call back or seek an in-person evaluation if the symptoms worsen or if the condition fails to improve as anticipated.    [name], MD

## 2019-05-01 ENCOUNTER — Encounter: Payer: Self-pay | Admitting: *Deleted

## 2019-05-01 ENCOUNTER — Telehealth: Payer: Self-pay | Admitting: Family Medicine

## 2019-05-01 NOTE — Telephone Encounter (Signed)
Last x-ray was done in August 2019. CD will be placed up front for pickup today. I will send her a mychart message to notify her as well.

## 2019-05-01 NOTE — Assessment & Plan Note (Signed)
Continue to monitor. She will come in for lab work.

## 2019-05-01 NOTE — Assessment & Plan Note (Signed)
Stable. Continue CPAP. She is getting benefit from this.

## 2019-05-01 NOTE — Assessment & Plan Note (Signed)
Symptoms likely radicular. She will keep her appointment with her specialist. We will make a disc of her recent lumbar spine films.

## 2019-05-01 NOTE — Telephone Encounter (Signed)
Butch Penny, please call this patient and get her set up for 4 months follow-up. Thanks.   Carolee Rota, could you please make a disc of her recent lumbar x-ray that was completed in our office? Thanks.

## 2019-05-01 NOTE — Telephone Encounter (Signed)
Pt scheduled for a 4 month follow-up  appt on 08/27/19 @ 8:30 am.

## 2019-05-01 NOTE — Assessment & Plan Note (Signed)
Continue metformin. Check labs.

## 2019-05-02 ENCOUNTER — Other Ambulatory Visit: Payer: BLUE CROSS/BLUE SHIELD

## 2019-05-08 DIAGNOSIS — G5702 Lesion of sciatic nerve, left lower limb: Secondary | ICD-10-CM | POA: Insufficient documentation

## 2019-05-08 DIAGNOSIS — M5416 Radiculopathy, lumbar region: Secondary | ICD-10-CM | POA: Diagnosis not present

## 2019-05-13 ENCOUNTER — Encounter: Payer: Self-pay | Admitting: Family Medicine

## 2019-05-13 NOTE — Telephone Encounter (Signed)
This should be evaluated with a visit.  Please contact her to get her scheduled with me or 1 of the nurse practitioners.  Thanks.

## 2019-05-14 ENCOUNTER — Other Ambulatory Visit (INDEPENDENT_AMBULATORY_CARE_PROVIDER_SITE_OTHER): Payer: BLUE CROSS/BLUE SHIELD

## 2019-05-14 ENCOUNTER — Other Ambulatory Visit: Payer: Self-pay

## 2019-05-14 DIAGNOSIS — E119 Type 2 diabetes mellitus without complications: Secondary | ICD-10-CM | POA: Diagnosis not present

## 2019-05-14 DIAGNOSIS — I10 Essential (primary) hypertension: Secondary | ICD-10-CM | POA: Diagnosis not present

## 2019-05-14 LAB — LIPID PANEL
Cholesterol: 125 mg/dL (ref 0–200)
HDL: 53.9 mg/dL (ref >39.00)
LDL Cholesterol: 51 mg/dL (ref 0–99)
NonHDL: 71.37
Total CHOL/HDL Ratio: 2
Triglycerides: 102 mg/dL (ref 0.0–149.0)
VLDL: 20.4 mg/dL (ref 0.0–40.0)

## 2019-05-14 LAB — COMPREHENSIVE METABOLIC PANEL
ALT: 16 U/L (ref 0–35)
AST: 15 U/L (ref 0–37)
Albumin: 3.9 g/dL (ref 3.5–5.2)
Alkaline Phosphatase: 67 U/L (ref 39–117)
BUN: 11 mg/dL (ref 6–23)
CO2: 31 mEq/L (ref 19–32)
Calcium: 9.4 mg/dL (ref 8.4–10.5)
Chloride: 100 mEq/L (ref 96–112)
Creatinine, Ser: 0.72 mg/dL (ref 0.40–1.20)
GFR: 82.85 mL/min (ref >60.00)
Glucose, Bld: 179 mg/dL — ABNORMAL HIGH (ref 70–99)
Potassium: 3.8 mEq/L (ref 3.5–5.1)
Sodium: 139 mEq/L (ref 135–145)
Total Bilirubin: 0.6 mg/dL (ref 0.2–1.2)
Total Protein: 6.6 g/dL (ref 6.0–8.3)

## 2019-05-14 LAB — HEMOGLOBIN A1C: Hgb A1c MFr Bld: 8.2 % — ABNORMAL HIGH (ref 4.6–6.5)

## 2019-05-15 ENCOUNTER — Ambulatory Visit (INDEPENDENT_AMBULATORY_CARE_PROVIDER_SITE_OTHER): Payer: BLUE CROSS/BLUE SHIELD | Admitting: Family Medicine

## 2019-05-15 VITALS — BP 154/90 | HR 90 | Temp 97.9°F | Resp 18 | Ht 65.0 in | Wt 239.0 lb

## 2019-05-15 DIAGNOSIS — D234 Other benign neoplasm of skin of scalp and neck: Secondary | ICD-10-CM | POA: Diagnosis not present

## 2019-05-15 MED ORDER — CEPHALEXIN 500 MG PO CAPS: 500.0000 mg | ORAL_CAPSULE | Freq: Four times a day (QID) | ORAL | 0 refills | Status: DC

## 2019-05-15 NOTE — Progress Notes (Signed)
Subjective:    Patient ID: Brittany Santos, female    DOB: 01-29-1961, 58 y.o.   MRN: [id number]  HPI   Patient presents to clinic due to lump on back of neck, believes it is an infected cyst.  Patient has had this area flareup before and that required antibiotics for treatment.  States when she first felt the area felt the size of a golf ball, but seems to have gone down in size.  Denies fever or chills.   Patient Active Problem List   Diagnosis Date Noted  . Bilateral leg edema 04/26/2018  . OSA (obstructive sleep apnea) 04/26/2018  . Foot callus 04/26/2018  . Hypertension 04/26/2018  . Sinusitis 12/22/2016  . Back pain, chronic 07/06/2016  . Skin lesion 06/19/2016  . IBS (irritable bowel syndrome) 12/21/2015  . GERD (gastroesophageal reflux disease) 04/21/2015  . Type 2 diabetes mellitus ([place]) 01/20/2015   Social History   Tobacco Use  . Smoking status: Never Smoker  . Smokeless tobacco: Never Used  Substance Use Topics  . Alcohol use: No    Alcohol/week: 0.0 standard drinks    Review of Systems  Constitutional: Negative for chills, fatigue and fever.  HENT: Negative for congestion, ear pain, sinus pain and sore throat.   Eyes: Negative.   Respiratory: Negative for cough, shortness of breath and wheezing.   Cardiovascular: Negative for chest pain, palpitations and leg swelling.  Gastrointestinal: Negative for abdominal pain, diarrhea, nausea and vomiting.  Genitourinary: Negative for dysuria, frequency and urgency.  Musculoskeletal: Negative for arthralgias and myalgias.  Skin: +lump/knot on back of neck.  Neurological: Negative for syncope, light-headedness and headaches.  Psychiatric/Behavioral: The patient is not nervous/anxious.       Objective:   Physical Exam Vitals signs and nursing note reviewed.  Constitutional:      General: She is not in acute distress.    Appearance: She is obese. She is not toxic-appearing.  HENT:     Head: Normocephalic  and atraumatic.      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 Skin is a little red and warm to touch.  Eyes:     General: No scleral icterus.    Extraocular Movements: Extraocular movements intact.     Conjunctiva/sclera: Conjunctivae normal.     Pupils: Pupils are equal, round, and reactive to light.  Cardiovascular:     Rate and Rhythm: Normal rate and regular rhythm.  Pulmonary:     Effort: Pulmonary effort is normal.     Breath sounds: Normal breath sounds.  Skin:    General: Skin is warm and dry.     Coloration: Skin is not jaundiced or pale.  Neurological:     General: No focal deficit present.     Mental Status: She is alert and oriented to person, place, and time.     Comments: Walks with cane due to chronic low back pain  Psychiatric:        Mood and Affect: Mood normal.        Behavior: Behavior normal.     Today's Vitals   05/15/19 1607 05/15/19 1615  BP: (!) 162/98 (!) 154/90  Pulse: 90   Resp: 18   Temp: 97.9 F (36.6 C)   TempSrc: Oral   SpO2: 92%   Weight: 239 lb (108.4 kg)   Height: 5\' 5"  (1.651 m)    Body mass index is 39.77 kg/m.  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  We will treat with Keflex 4 times daily for 10 days.  Patient advised that the area should go down in size and redness/tenderness should improve.  Advised that if area recurs, we can consider referral for evaluation to remove the cyst surgically/  Patient advised to call office right away if area does not improve as expected.  She will otherwise keep regularly scheduled follow-up with PCP as planned and will return to clinic at any time if issues arise.

## 2019-05-16 ENCOUNTER — Encounter: Payer: Self-pay | Admitting: *Deleted

## 2019-05-20 ENCOUNTER — Ambulatory Visit: Payer: Self-pay | Admitting: *Deleted

## 2019-05-20 DIAGNOSIS — R112 Nausea with vomiting, unspecified: Secondary | ICD-10-CM

## 2019-05-20 MED ORDER — ONDANSETRON 4 MG PO TBDP: 4.0000 mg | ORAL_TABLET | Freq: Three times a day (TID) | ORAL | 0 refills | Status: DC | PRN

## 2019-05-20 MED ORDER — METFORMIN HCL ER 500 MG PO TB24: 1000.0000 mg | ORAL_TABLET | Freq: Two times a day (BID) | ORAL | 1 refills | Status: DC

## 2019-05-20 NOTE — Telephone Encounter (Signed)
Patient can stop keflex. She has been on it about 5-6 days and that is a good length of time.   I do not think she needs another antibiotic; she can follow up the end of this week and we can recheck the area on back of neck.  I worry another antibiotic right now will upset stomach more  Eat bland foods, clear liquids for today and tomorrow and slowly advance back to normal diet as tolerated  Thanks  LG

## 2019-05-20 NOTE — Telephone Encounter (Signed)
Patient saw NP on 05/15/19 Keflex prescribed see note patient having nausea vomiting with Keflex

## 2019-05-20 NOTE — Telephone Encounter (Signed)
Called Pt and told her a Rx for nausea was sent to the pharmacy for her to pick up.

## 2019-05-20 NOTE — Telephone Encounter (Signed)
Contacted pt regarding her symptoms; she states that she started taking keflex on 05/15/2019; she started feeling queasy on 05/18/2019 which progressed to vomiting the night of 05/19/2019 (3 episodes); she had 2 spoonfuls of rice this morning, but stopped because she started feeling queasy; the pt has not checked her blood sugar because she did not take her metformin last night; the pt would like to know if she can be prescribed something different; recommendations made per nurse triage protocol; the pt can be contacted at [phone]; she verifies that she uses [facility] in [place]; she normally sees Dr Caryl Bis; will route to office for final disposition.  Reason for Disposition . Vomiting a prescription medication  Answer Assessment - Initial Assessment Questions 1. VOMITING SEVERITY: "How many times have you vomited in the past 24 hours?"     - MILD:  1 - 2 times/day    - MODERATE: 3 - 5 times/day, decreased oral intake without significant weight loss or symptoms of dehydration    - SEVERE: 6 or more times/day, vomits everything or nearly everything, with significant weight loss, symptoms of dehydration      3 2. ONSET: "When did the vomiting begin?"     05/19/2019 around 2130 3. FLUIDS: "What fluids or food have you vomited up today?" "Have you been able to keep any fluids down?"  sips of water  4. ABDOMINAL PAIN: "Are your having any abdominal pain?" If yes : "How bad is it and what does it feel like?" (e.g., crampy, dull, intermittent, constant)      Gurgling, intermittent sharp 5. DIARRHEA: "Is there any diarrhea?" If so, ask: "How many times today?"      no 6. CONTACTS: "Is there anyone else in the family with the same symptoms?"      no 7. CAUSE: "What do you think is causing your vomiting?"     Coating on Keflex capsule 8. HYDRATION STATUS: "Any signs of dehydration?" (e.g., dry mouth [not only dry lips], too weak to stand) "When did you last urinate?"     No last  urinated 05/20/2019 (yellow tint) 9. OTHER SYMPTOMS: "Do you have any other symptoms?" (e.g., fever, headache, vertigo, vomiting blood or coffee grounds, recent head injury)     no 10. PREGNANCY: "Is there any chance you are pregnant?" "When was your last menstrual period?"       No, menopause  Protocols used: [facility]

## 2019-05-20 NOTE — Addendum Note (Signed)
Addended by: Philis Nettle on: 05/20/2019 10:49 AM   Modules accepted: Orders

## 2019-05-20 NOTE — Telephone Encounter (Signed)
Called Pt with information and about stopping the keflex. Pt stated everything even water makes her nausea and is there anything she can take for that.

## 2019-05-20 NOTE — Telephone Encounter (Signed)
I sent zofran into pharmacy for use to help nausea

## 2019-05-26 ENCOUNTER — Other Ambulatory Visit: Payer: Self-pay | Admitting: Family Medicine

## 2019-05-29 ENCOUNTER — Other Ambulatory Visit: Payer: Self-pay | Admitting: Family Medicine

## 2019-05-29 MED ORDER — METFORMIN HCL ER 500 MG PO TB24: 1000.0000 mg | ORAL_TABLET | Freq: Two times a day (BID) | ORAL | 1 refills | Status: DC

## 2019-06-05 ENCOUNTER — Telehealth: Payer: Self-pay

## 2019-06-05 NOTE — Telephone Encounter (Signed)
This is a Adult nurse pt.

## 2019-06-05 NOTE — Telephone Encounter (Signed)
Copied from [place] [phone]. Topic: General - Other >> Jun 04, 2019  3:05 PM Ivar Drape wrote: Reason for CRM:  Cloyde Reams w/[employer]  [phone] ex [phone] wanted to know if the patient's medical records was sent to her insurance company [employer] explaining why she needed the MRI.  Please advise.

## 2019-06-06 DIAGNOSIS — G5702 Lesion of sciatic nerve, left lower limb: Secondary | ICD-10-CM | POA: Diagnosis not present

## 2019-06-06 DIAGNOSIS — M5416 Radiculopathy, lumbar region: Secondary | ICD-10-CM | POA: Diagnosis not present

## 2019-06-06 NOTE — Telephone Encounter (Signed)
It looks like Dr. Ernestina Patches ordered this for patient. Message sent to our office. Please see message below. Thanks, [employer]

## 2019-06-10 NOTE — Telephone Encounter (Signed)
Pt had MRI of Hip back in 06/09/2018 and looks as if it was approved, was there another test she is referring to?

## 2019-06-13 NOTE — Telephone Encounter (Signed)
That I do not know. The message was taken and sent to me, but you are right, her MRI was last year.

## 2019-06-17 ENCOUNTER — Telehealth: Payer: Self-pay

## 2019-06-17 MED ORDER — EMPAGLIFLOZIN 10 MG PO TABS: 10.0000 mg | ORAL_TABLET | Freq: Every day | ORAL | 2 refills | Status: DC

## 2019-06-17 NOTE — Telephone Encounter (Signed)
I sent the Jardiance to her pharmacy.  Please let the patient know that if she is to develop an illness where she has vomiting or diarrhea or has poor intake by mouth she will need to hold the Jardiance until the symptoms have resolved and then she can resume the Jardiance.  Thanks.

## 2019-06-18 NOTE — Telephone Encounter (Signed)
Pt returning call to office please call  after 2:45

## 2019-06-18 NOTE — Telephone Encounter (Signed)
lmtcb to discuss medication.  Brittany Santos,cma

## 2019-06-23 NOTE — Telephone Encounter (Signed)
Patient states she is not having any symptoms, her mom is having Nausea, diarrhea, weakness in her legs and dizziness, she saw [name] today and she states they are not sure if she has covid or not but wanted her tested.Brittany Santos

## 2019-06-23 NOTE — Telephone Encounter (Signed)
I will forward to [facility] to see if the patient would meet criteria for testing. I believe patients have to have symptoms or a known COVID19 exposure to meet criteria for testing though I will make sure with Juliann Pulse.

## 2019-06-23 NOTE — Telephone Encounter (Signed)
Patient's mom is being tested for covid and she lives with mom and wants to be tested as well, mom's test is at 1pm tomorrow.  Brittany Santos,cma

## 2019-06-23 NOTE — Telephone Encounter (Signed)
Please find out if the patient is having any COVID19 symptoms? What symptoms prompted her mother to be tested?

## 2019-06-24 NOTE — Telephone Encounter (Signed)
Noted. She can only be tested once it has been at least 5 days since her mother developed symptoms. I am happy to arrange for testing once it has been 5 days since her mother developed symptoms. Please find out what day her mother developed symptoms. She should quarantine at home for now and we can arrange for testing to be completed once it has been 5 days since her mother developed symptoms.

## 2019-06-24 NOTE — Telephone Encounter (Signed)
Sorry, meant to send that to [place]. [name] please see the below message.

## 2019-06-24 NOTE — Telephone Encounter (Signed)
Noted. Please see let her know that she has to be 5 days out from first exposure to her mother when her mother first had symptoms. Once she has met that criteria we can get her tested. Thanks.

## 2019-06-24 NOTE — Telephone Encounter (Signed)
I called the Patient's daughter and she states yes she lives with her mom and she has been around her because she is also working from home.  The mom is being tested today @ 1 pm at [facility].  Helaman Mecca,cma

## 2019-06-24 NOTE — Telephone Encounter (Signed)
She meets criteria due to possible COVID exposure but her exposure time needs to be at least 5 days since she was first around her mother exhibiting symptoms.

## 2019-06-25 ENCOUNTER — Telehealth: Payer: Self-pay

## 2019-06-25 DIAGNOSIS — G4733 Obstructive sleep apnea (adult) (pediatric): Secondary | ICD-10-CM | POA: Diagnosis not present

## 2019-06-25 DIAGNOSIS — Z20822 Contact with and (suspected) exposure to covid-19: Secondary | ICD-10-CM

## 2019-06-25 NOTE — Telephone Encounter (Signed)
Patient called to schedule covid testing, she verbalized understanding. Appointment scheduled for tomorrow, 06/26/19 at 1545 at [facility], advised of location and to wear a mask for everyone in the vehicle, she verbalized understanding. Order placed.     Leone Haven, MD 55 minutes ago (5:28 PM)     Noted. It has now been 5 days since exposure to possible COVID19. I will forward to the testing pool to get her scheduled for testing. Please contact the patient to arrange COVID19 testing given possible exposure to [place]. Thanks

## 2019-06-25 NOTE — Telephone Encounter (Signed)
Noted. It has now been 5 days since exposure to possible COVID19. I will forward to the testing pool to get her scheduled for testing. Please contact the patient to arrange COVID19 testing given possible exposure to [place]. Thanks.

## 2019-06-25 NOTE — Telephone Encounter (Signed)
patietn states her mother started having symptoms on Friday July 3,2020.

## 2019-06-26 ENCOUNTER — Other Ambulatory Visit: Payer: BLUE CROSS/BLUE SHIELD

## 2019-06-26 DIAGNOSIS — Z20822 Contact with and (suspected) exposure to covid-19: Secondary | ICD-10-CM

## 2019-06-26 DIAGNOSIS — R6889 Other general symptoms and signs: Secondary | ICD-10-CM | POA: Diagnosis not present

## 2019-06-27 NOTE — Telephone Encounter (Signed)
Patient is scheduled for testing.  Dalila Arca,cma

## 2019-07-01 LAB — NOVEL CORONAVIRUS, NAA: SARS-CoV-2, NAA: NOT DETECTED

## 2019-07-02 ENCOUNTER — Telehealth: Payer: Self-pay

## 2019-07-02 NOTE — Telephone Encounter (Signed)
Pt states September 2019 she came here with Back/hip pain and you did a xray of her back, then you ordered a MRI of the hip and back. [employer] is denying the claim stating it was not medically necessary and she is being billed $1100.00 she had it done at [facility].

## 2019-07-02 NOTE — Telephone Encounter (Signed)
What is this appeal for? Is it for a medication or something else? It says services, what services are they referring to?

## 2019-07-02 NOTE — Telephone Encounter (Signed)
Copied from [place] #[id number]. Topic: General - Other >> Jul 02, 2019  1:26 PM Rainey Pines A wrote: Alver Fisher from [employer] [facility] is calling to initiate an appeal on behalf of patient for services 09/07/2018 and wants provider to start appeal process by calling [phone] option 1.

## 2019-07-02 NOTE — Telephone Encounter (Signed)
Noted. I am going to forward to [facility] to see if we were able to get this approved by her insurance prior to getting it scheduled. Please check with Melissa to see if she can check in to that.

## 2019-07-02 NOTE — Telephone Encounter (Signed)
Brittany Santos from [place] is calling to initiate an appeal on behalf of patient for services 09/07/2018 and wants provider to start appeal process by calling [phone] option 1.

## 2019-07-03 NOTE — Telephone Encounter (Signed)
I just went through and they actually allowed me to do a retroactive authorization for her. I have the authorization and will send it to [place] so they can re-file this for her. I will also let the patient know. [employer]

## 2019-07-03 NOTE — Telephone Encounter (Signed)
No, once I submit it to [place] they look at the insurance and then will let me know if Josem Kaufmann is required. They entered in the appt detail:  [employer] - no auth req per Kris//np pt est cost per recond[facility] $84.17 lmom w/benefits w/appt date arrive @ 5:10pm @ [facility] site/clc  Melissa

## 2019-07-03 NOTE — Telephone Encounter (Signed)
Thank you for doing that. If they need me to call in the future just let me know.

## 2019-07-03 NOTE — Telephone Encounter (Signed)
Spoke to [employer] @ [facility] Billing department. Gave her the authorization and she has entered it into her claim.

## 2019-07-09 ENCOUNTER — Telehealth: Payer: Self-pay | Admitting: Gastroenterology

## 2019-07-09 ENCOUNTER — Other Ambulatory Visit: Payer: Self-pay

## 2019-07-09 MED ORDER — PANTOPRAZOLE SODIUM 40 MG PO TBEC: 40.0000 mg | DELAYED_RELEASE_TABLET | Freq: Two times a day (BID) | ORAL | 5 refills | Status: DC

## 2019-07-09 NOTE — Telephone Encounter (Signed)
Rx refill for Pantoprazole has been sent to pt's pharmacy.

## 2019-07-09 NOTE — Telephone Encounter (Signed)
Pt is calling she needs refill on rx Pantoprozole 40 mg  2 x a day [facility] she only has 2 pills left

## 2019-07-17 DIAGNOSIS — G5701 Lesion of sciatic nerve, right lower limb: Secondary | ICD-10-CM | POA: Diagnosis not present

## 2019-07-22 DIAGNOSIS — E119 Type 2 diabetes mellitus without complications: Secondary | ICD-10-CM | POA: Diagnosis not present

## 2019-07-22 DIAGNOSIS — H2513 Age-related nuclear cataract, bilateral: Secondary | ICD-10-CM | POA: Diagnosis not present

## 2019-07-22 DIAGNOSIS — Z7984 Long term (current) use of oral hypoglycemic drugs: Secondary | ICD-10-CM | POA: Diagnosis not present

## 2019-07-22 LAB — HM DIABETES EYE EXAM

## 2019-08-09 ENCOUNTER — Other Ambulatory Visit: Payer: Self-pay | Admitting: Family Medicine

## 2019-08-11 ENCOUNTER — Ambulatory Visit
Admission: RE | Admit: 2019-08-11 | Discharge: 2019-08-11 | Disposition: A | Discharge status: Home / Self Care | Payer: BC Managed Care – PPO | Source: Ambulatory Visit | Attending: Obstetrics and Gynecology | Admitting: Obstetrics and Gynecology

## 2019-08-11 DIAGNOSIS — Z1231 Encounter for screening mammogram for malignant neoplasm of breast: Secondary | ICD-10-CM | POA: Diagnosis not present

## 2019-08-13 NOTE — Progress Notes (Signed)
WNL

## 2019-08-26 ENCOUNTER — Other Ambulatory Visit: Payer: Self-pay

## 2019-08-27 ENCOUNTER — Encounter: Payer: Self-pay | Admitting: Family Medicine

## 2019-08-27 ENCOUNTER — Other Ambulatory Visit: Payer: Self-pay

## 2019-08-27 ENCOUNTER — Ambulatory Visit (INDEPENDENT_AMBULATORY_CARE_PROVIDER_SITE_OTHER): Payer: BC Managed Care – PPO | Admitting: Family Medicine

## 2019-08-27 VITALS — BP 130/70 | HR 81 | Temp 97.6°F | Ht 64.0 in | Wt 226.4 lb

## 2019-08-27 DIAGNOSIS — E119 Type 2 diabetes mellitus without complications: Secondary | ICD-10-CM | POA: Diagnosis not present

## 2019-08-27 DIAGNOSIS — Z23 Encounter for immunization: Secondary | ICD-10-CM | POA: Diagnosis not present

## 2019-08-27 DIAGNOSIS — R0989 Other specified symptoms and signs involving the circulatory and respiratory systems: Secondary | ICD-10-CM

## 2019-08-27 DIAGNOSIS — R600 Localized edema: Secondary | ICD-10-CM | POA: Diagnosis not present

## 2019-08-27 DIAGNOSIS — I10 Essential (primary) hypertension: Secondary | ICD-10-CM | POA: Diagnosis not present

## 2019-08-27 LAB — POCT GLYCOSYLATED HEMOGLOBIN (HGB A1C): Hemoglobin A1C: 6.4 % — AB (ref 4.0–5.6)

## 2019-08-27 LAB — MICROALBUMIN / CREATININE URINE RATIO
Creatinine,U: 90 mg/dL
Microalb Creat Ratio: 0.9 mg/g (ref 0.0–30.0)
Microalb, Ur: 0.9 mg/dL (ref 0.0–1.9)

## 2019-08-27 NOTE — Patient Instructions (Signed)
Nice to see you. We will contact you with your results.  You can call the vascular surgeons in [place] to schedule an appointment. The referral should be in place.  ENT will contact you for an appointment.

## 2019-08-27 NOTE — Assessment & Plan Note (Signed)
A1c well controlled.  Continue current regimen.  Discussed appropriate response to feeling hypoglycemic.

## 2019-08-27 NOTE — Assessment & Plan Note (Signed)
I suspect this is related to venous insufficiency.  We will refer her to another vascular office for a second opinion.

## 2019-08-27 NOTE — Assessment & Plan Note (Signed)
Mild redness of the right tonsillar area though no tonsillar enlargement.  Discussed referral to ENT for evaluation and exam.

## 2019-08-27 NOTE — Progress Notes (Signed)
[name], MD Phone: [phone]  Brittany Santos is a 58 y.o. female who presents today for follow-up.  Tonsil irritation: Patient feels as though there is something stuck behind her right tonsil.  She notes it has been like that for a week.  She has had issues with this previously and notes her dentist has removed food from behind the tonsil at times.  No fevers, cough, or congestion.  Gargling has not helped.  She does follow with an ENT for allergy issues.  Hypertension: Notes BP is typically around 135 over 70s.  No chest pain, shortness of breath.  She does have chronic lower extremity edema.  Venous insufficiency: Chronic issues with this.  She does have stasis dermatitis changes that have been treated with steroids topically on several occasions with no benefit.  She has seen vascular surgery locally though wonders if her testing was done in an accurate manner as she was lying down and her sister had testing where she was standing up at a different vascular office.  She wonders if the pain that she has chronically in her right hip is related to the swelling.  She reports she did have nerve testing done and notes the orthopedist that it is not the nerves of the muscles.  Diabetes: Typically running 155-166.  Taking metformin and Jardiance.  She just saw ophthalmology last month.  No polyuria or polydipsia.  Only one episode of hypoglycemia where she did not eat lunch.   Social History   Tobacco Use  Smoking Status Never Smoker  Smokeless Tobacco Never Used     ROS see history of present illness  Objective  Physical Exam Vitals:   08/27/19 0845  BP: 130/70  Pulse: 81  Temp: 97.6 F (36.4 C)  SpO2: 96%    BP Readings from Last 3 Encounters:  08/27/19 130/70  05/15/19 (!) 154/90  12/16/18 130/76   Wt Readings from Last 3 Encounters:  08/27/19 226 lb 6.4 oz (102.7 kg)  05/15/19 239 lb (108.4 kg)  12/16/18 235 lb 9.6 oz (106.9 kg)    Physical Exam  Constitutional:      General: She is not in acute distress.    Appearance: She is not diaphoretic.  HENT:     Mouth/Throat:   Cardiovascular:     Rate and Rhythm: Normal rate and regular rhythm.     Heart sounds: Normal heart sounds.  Pulmonary:     Effort: Pulmonary effort is normal.     Breath sounds: Normal breath sounds.  Musculoskeletal:     Comments: Mild edema bilateral lower extremities with venous stasis dermatitis skin changes anteriorly with no signs of infection  Skin:    General: Skin is warm and dry.  Neurological:     Mental Status: She is alert.      Assessment/Plan: Please see individual problem list.  Hypertension  Adequately controlled.  Continue to monitor.  Type 2 diabetes mellitus A1c well controlled.  Continue current regimen.  Discussed appropriate response to feeling hypoglycemic.  Bilateral leg edema I suspect this is related to venous insufficiency.  We will refer her to another vascular office for a second opinion.  Tonsil pain Mild redness of the right tonsillar area though no tonsillar enlargement.  Discussed referral to ENT for evaluation and exam.   Orders Placed This Encounter  Procedures  . Flu Vaccine QUAD 36+ mos IM  . Urine Microalbumin w/creat. ratio  . Ambulatory referral to ENT    Referral Priority:  Routine    Referral Type:   Consultation    Referral Reason:   Specialty Services Required    Requested Specialty:   Otolaryngology    Number of Visits Requested:   1  . Ambulatory referral to Vascular Surgery    Referral Priority:   Routine    Referral Type:   Surgical    Referral Reason:   Specialty Services Required    Requested Specialty:   Vascular Surgery    Number of Visits Requested:   1  . POCT HgB A1C    No orders of the defined types were placed in this encounter.    [name], MD [place]

## 2019-08-27 NOTE — Assessment & Plan Note (Addendum)
Adequately controlled. Continue to monitor. 

## 2019-08-30 ENCOUNTER — Ambulatory Visit
Admission: EM | Admit: 2019-08-30 | Discharge: 2019-08-30 | Disposition: A | Discharge status: Home / Self Care | Payer: BC Managed Care – PPO | Attending: Family | Admitting: Family

## 2019-08-30 ENCOUNTER — Encounter: Payer: Self-pay | Admitting: Emergency Medicine

## 2019-08-30 ENCOUNTER — Other Ambulatory Visit: Payer: Self-pay

## 2019-08-30 DIAGNOSIS — J029 Acute pharyngitis, unspecified: Secondary | ICD-10-CM | POA: Diagnosis not present

## 2019-08-30 LAB — RAPID STREP SCREEN (MED CTR MEBANE ONLY): Streptococcus, Group A Screen (Direct): NEGATIVE

## 2019-08-30 MED ORDER — MAGIC MOUTHWASH: 10.0000 mL | Freq: Four times a day (QID) | ORAL | 0 refills | Status: DC | PRN

## 2019-08-30 NOTE — Discharge Instructions (Addendum)
Recommend start Magic Mouthwash- take 1- 2 teaspoons and swish and swallow every 6 hours as needed for throat pain. May take Tylenol 1000mg  or Ibuprofen 600mg  every 8 hours as needed for pain. Follow-up pending strep culture results.

## 2019-08-30 NOTE — ED Triage Notes (Signed)
Pt c/o sore throat, and right tonsil swelling. Started about a week ago. Denies fever. Declines COVID testing.

## 2019-08-30 NOTE — ED Provider Notes (Signed)
[facility]    CSN: [id number] Arrival date & time: 08/30/19  1451      History   Chief Complaint Chief Complaint  Patient presents with  . Sore Throat    HPI Brittany Santos is a 58 y.o. female.   58 year old female presents with right sided throat pain and tonsil swelling that started about 1 week ago. Having more difficulty swallowing due to pain. Denies any fever, nasal congestion, cough or GI symptoms. Has history of sinus infections but current symptoms are not similar to previous sinus infections. Concerned she may have food caught in her tonsil. Has gargled with warm salt water with minimal relief. No other family members ill. No known exposure to COVID 19. Concerned about current illness since lives with mom who was just diagnosed with breast cancer and is having surgery in 10 days. Other chronic health issues include diabetes, HTN, arthritis, GERD, sleep apnea and environmental allergies and currently on Metformin, Jardiance, Protonix, Voltaren, Progesterone and Zyrtec daily and Tramadol prn.   The history is provided by the patient.    Past Medical History:  Diagnosis Date  . Allergy   . Arthritis   . Diabetes mellitus without complication ([place])   . Family history of breast cancer    11/19 genetic testing letter sent  . GERD (gastroesophageal reflux disease)   . History of colon polyps   . History of diverticulitis   . History of kidney stones 04/15/15    Patient Active Problem List   Diagnosis Date Noted  . Tonsil pain 08/27/2019  . Bilateral leg edema 04/26/2018  . OSA (obstructive sleep apnea) 04/26/2018  . Foot callus 04/26/2018  . Hypertension 04/26/2018  . Back pain, chronic 07/06/2016  . Skin lesion 06/19/2016  . IBS (irritable bowel syndrome) 12/21/2015  . GERD (gastroesophageal reflux disease) 04/21/2015  . Type 2 diabetes mellitus ([place]) 01/20/2015    Past Surgical History:  Procedure Laterality Date  . BILATERAL CARPAL TUNNEL  RELEASE    . BREAST SURGERY    . cervical neck fusion    . CHOLECYSTECTOMY    . ESOPHAGOGASTRODUODENOSCOPY (EGD) WITH PROPOFOL N/A 05/12/2016   Procedure: ESOPHAGOGASTRODUODENOSCOPY (EGD) WITH PROPOFOL;  Surgeon: Lollie Sails, MD;  Location: [facility] ENDOSCOPY;  Service: Endoscopy;  Laterality: N/A;    OB History    Gravida  0   Para  0   Term  0   Preterm  0   AB  0   Living  0     SAB  0   TAB  0   Ectopic  0   Multiple  0   Live Births  0            Home Medications    Prior to Admission medications   Medication Sig Start Date End Date Taking? Authorizing Provider  BIOTIN PO Take 1 tablet by mouth daily.   Yes [provider]  Calcium-Magnesium-Vitamin D (CALCIUM 500 PO) Take by mouth.   Yes [provider]  cetirizine (ZYRTEC) 10 MG tablet Take 10 mg by mouth daily.   Yes [provider]  diclofenac (VOLTAREN) 75 MG EC tablet  11/13/16  Yes [provider]  glucose blood test strip Use as instructed Dx: E11.9 09/29/15  Yes Doss, Velora Heckler, RN  JARDIANCE 10 MG TABS tablet TAKE 1 TABLET BY MOUTH DAILY 08/11/19  Yes Leone Haven, MD  Lancet Devices (ONE TOUCH DELICA LANCING DEV) MISC Use as directed Dx:  E11.9 09/29/15  Yes Doss, Velora Heckler, RN  metFORMIN (GLUCOPHAGE XR) 500 MG 24 hr tablet Take 2 tablets (1,000 mg total) by mouth 2 (two) times daily with a meal. 05/29/19  Yes Leone Haven, MD  Multiple Vitamin (MULTIVITAMIN) capsule Take 1 capsule by mouth daily.   Yes [provider]  pantoprazole (PROTONIX) 40 MG tablet Take 1 tablet (40 mg total) by mouth 2 (two) times daily. 07/09/19  Yes [name], MD  progesterone (PROMETRIUM) 100 MG capsule TAKE 1 CAPSULE EVERY DAY 11/07/18  Yes Rexene Agent, CNM  traMADol (ULTRAM) 50 MG tablet Take 50 mg by mouth every 6 (six) hours as needed.   Yes [provider]  vitamin B-12 (CYANOCOBALAMIN) 100 MCG tablet Take 100 mcg by mouth daily.   Yes [provider]  magic mouthwash SOLN Take 10 mLs by mouth 4 (four) times daily as needed (throat pain). 08/30/19   Katy Apo, NP    Family History Family History  Problem Relation Age of Onset  . Hypertension Brother   . Breast cancer Mother 89  . Cancer Sister 8       Blood cancer (unknown)  . Cancer Maternal Aunt        Lung  . Cancer Maternal Uncle 30       kidney  . Heart attack Paternal Uncle   . Aneurysm Paternal Grandmother   . Diabetes Neg Hx     Social History Social History   Tobacco Use  . Smoking status: Never Smoker  . Smokeless tobacco: Never Used  Substance Use Topics  . Alcohol use: No    Alcohol/week: 0.0 standard drinks  . Drug use: No     Allergies   Clarithromycin and Vicodin [hydrocodone-acetaminophen]   Review of Systems Review of Systems  Constitutional: Positive for appetite change. Negative for activity change, chills, fatigue and fever.  HENT: Positive for mouth sores, sore throat and trouble swallowing. Negative for congestion, ear discharge, ear pain, facial swelling, postnasal drip, rhinorrhea, sinus pressure and sinus pain.   Eyes: Negative for pain, discharge, redness and itching.  Respiratory: Negative for cough, chest tightness, shortness of breath and wheezing.   Gastrointestinal: Negative for abdominal pain, nausea and vomiting.  Musculoskeletal: Positive for arthralgias. Negative for neck pain and neck stiffness.  Skin: Negative for color change, rash and wound.  Allergic/Immunologic: Positive for environmental allergies.  Neurological: Negative for dizziness, tremors, seizures, syncope, weakness, light-headedness, numbness and headaches.  Hematological: Positive for adenopathy. Does not bruise/bleed easily.     Physical Exam Triage Vital Signs ED Triage Vitals  Enc Vitals Group     BP 08/30/19 1529 (!) 152/78     Pulse Rate 08/30/19 1529 86     Resp 08/30/19 1529 18     Temp 08/30/19 1529 98.2 F (36.8 C)      Temp Source 08/30/19 1529 Oral     SpO2 08/30/19 1529 98 %     Weight 08/30/19 1524 227 lb (103 kg)     Height 08/30/19 1524 5\' 4"  (1.626 m)     Head Circumference --      Peak Flow --      Pain Score 08/30/19 1524 5     Pain Loc --      Pain Edu? --      Excl. in [place]? --    No data found.  Updated Vital Signs BP (!) 152/78 (BP Location: Right Arm)   Pulse 86  Temp 98.2 F (36.8 C) (Oral)   Resp 18   Ht 5\' 4"  (1.626 m)   Wt 227 lb (103 kg)   LMP  (LMP Unknown) Comment: Patient states she had her "uterus scraped" sometime in the past 10 years.   SpO2 98%   BMI 38.96 kg/m   Visual Acuity Right Eye Distance:   Left Eye Distance:   Bilateral Distance:    Right Eye Near:   Left Eye Near:    Bilateral Near:     Physical Exam Vitals signs and nursing note reviewed.  Constitutional:      General: She is awake. She is not in acute distress.    Appearance: She is well-developed, well-groomed and overweight. She is not ill-appearing.     Comments: Patient sitting comfortably in exam chair in no acute distress but changes positions slowly due to pain from arthritis in knees.   HENT:     Head: Normocephalic and atraumatic.     Right Ear: Hearing, tympanic membrane, ear canal and external ear normal.     Left Ear: Hearing, tympanic membrane, ear canal and external ear normal.     Nose: Nose normal. No congestion or rhinorrhea.     Right Sinus: No maxillary sinus tenderness or frontal sinus tenderness.     Left Sinus: No maxillary sinus tenderness or frontal sinus tenderness.     Mouth/Throat:     Lips: Pink.     Mouth: Mucous membranes are moist.     Pharynx: Uvula midline. Pharyngeal swelling and posterior oropharyngeal erythema present. No oropharyngeal exudate or uvula swelling.     Tonsils: Tonsillar exudate (slight white ) present. No tonsillar abscesses. 2+ on the right. 1+ on the left.  Eyes:     Extraocular Movements: Extraocular movements intact.      Conjunctiva/sclera: Conjunctivae normal.  Neck:     Musculoskeletal: Normal range of motion and neck supple.  Cardiovascular:     Rate and Rhythm: Normal rate and regular rhythm.     Heart sounds: Normal heart sounds. No murmur.  Pulmonary:     Effort: Pulmonary effort is normal. No respiratory distress.     Breath sounds: Normal breath sounds and air entry. No decreased breath sounds, wheezing, rhonchi or rales.  Lymphadenopathy:     Head:     Right side of head: Tonsillar adenopathy present.     Left side of head: No tonsillar adenopathy.     Cervical: Cervical adenopathy present.     Right cervical: Superficial cervical adenopathy present.     Left cervical: No superficial cervical adenopathy.  Skin:    General: Skin is warm and dry.     Capillary Refill: Capillary refill takes less than 2 seconds.     Findings: No rash.  Neurological:     General: No focal deficit present.     Mental Status: She is alert and oriented to person, place, and time.  Psychiatric:        Mood and Affect: Mood normal.        Behavior: Behavior normal. Behavior is cooperative.        Thought Content: Thought content normal.        Judgment: Judgment normal.      UC Treatments / Results  Labs (all labs ordered are listed, but only abnormal results are displayed) Labs Reviewed  RAPID STREP SCREEN ([facility] ONLY)  CULTURE, GROUP A STREP [facility])    EKG   Radiology No results found.  Procedures Procedures (including critical care time)  Medications Ordered in UC Medications - No data to display  Initial Impression / Assessment and Plan / UC Course  I have reviewed the triage vital signs and the nursing notes.  Pertinent labs & imaging results that were available during my care of the patient were reviewed by me and considered in my medical decision making (see chart for details).    Reviewed negative rapid strep test with patient. White spots or exudate on tonsils do not  automatically diagnose strep. Discussed that she probably has a viral illness- can not rule out COVID 19 and patient declines testing. Discussed that she should stay at home, wash hands and have limited contact with her mom so that she doesn't get her sick. May start Magic Mouthwash every 6 hours as needed for throat pain. Discussed that I do not see food on her tonsil and unable to scrape off any exudate. May need to see an ENT if symptoms persist. Recommend take Tylenol 1000mg  or Ibuprofen 600mg  every 8 hours as needed for pain. Follow-up pending strep culture results.  Final Clinical Impressions(s) / UC Diagnoses   Final diagnoses:  Sore throat     Discharge Instructions     Recommend start Magic Mouthwash- take 1- 2 teaspoons and swish and swallow every 6 hours as needed for throat pain. May take Tylenol 1000mg  or Ibuprofen 600mg  every 8 hours as needed for pain. Follow-up pending strep culture results.     ED Prescriptions    Medication Sig Dispense Auth. Provider   magic mouthwash SOLN Take 10 mLs by mouth 4 (four) times daily as needed (throat pain). 120 mL Katy Apo, NP     Controlled Substance Prescriptions [place] Controlled Substance Registry consulted? Not Applicable   Katy Apo, NP 08/31/19 [phone]

## 2019-09-02 LAB — CULTURE, GROUP A STREP (THRC)

## 2019-09-04 DIAGNOSIS — J039 Acute tonsillitis, unspecified: Secondary | ICD-10-CM | POA: Diagnosis not present

## 2019-09-04 DIAGNOSIS — R1314 Dysphagia, pharyngoesophageal phase: Secondary | ICD-10-CM | POA: Diagnosis not present

## 2019-09-18 ENCOUNTER — Other Ambulatory Visit: Payer: Self-pay

## 2019-09-18 DIAGNOSIS — I872 Venous insufficiency (chronic) (peripheral): Secondary | ICD-10-CM

## 2019-09-22 ENCOUNTER — Other Ambulatory Visit: Payer: Self-pay

## 2019-09-22 ENCOUNTER — Encounter: Payer: Self-pay | Admitting: Family

## 2019-09-22 ENCOUNTER — Ambulatory Visit (INDEPENDENT_AMBULATORY_CARE_PROVIDER_SITE_OTHER): Payer: BC Managed Care – PPO | Admitting: Family

## 2019-09-22 ENCOUNTER — Ambulatory Visit (HOSPITAL_COMMUNITY)
Admission: RE | Admit: 2019-09-22 | Discharge: 2019-09-22 | Disposition: A | Discharge status: Home / Self Care | Payer: BC Managed Care – PPO | Source: Ambulatory Visit | Attending: Family | Admitting: Family

## 2019-09-22 VITALS — BP 144/93 | HR 84 | Temp 97.8°F | Resp 16 | Ht 64.0 in | Wt 225.0 lb

## 2019-09-22 DIAGNOSIS — I872 Venous insufficiency (chronic) (peripheral): Secondary | ICD-10-CM

## 2019-09-22 NOTE — Patient Instructions (Addendum)
To decrease swelling in your feet and legs: Elevate feet above slightly bent knees, feet above heart, overnight and 3-4 times per day for 20 minutes.    Chronic Venous Insufficiency Chronic venous insufficiency is a condition where the leg veins cannot effectively pump blood from the legs to the heart. This happens when the vein walls are either stretched, weakened, or damaged, or when the valves inside the vein are damaged. With the right treatment, you should be able to continue with an active life. This condition is also called venous stasis. What are the causes? Common causes of this condition include:  High blood pressure inside the veins (venous hypertension).  Sitting or standing too long, causing increased blood pressure in the leg veins.  A blood clot that blocks blood flow in a vein (deep vein thrombosis, DVT).  Inflammation of a vein (phlebitis) that causes a blood clot to form.  Tumors in the pelvis that cause blood to back up. What increases the risk? The following factors may make you more likely to develop this condition:  Having a family history of this condition.  Obesity.  Pregnancy.  Living without enough regular physical activity or exercise (sedentary lifestyle).  Smoking.  Having a job that requires long periods of standing or sitting in one place.  Being a certain age. Women in their 20s and 30s and men in their 58s are more likely to develop this condition. What are the signs or symptoms? Symptoms of this condition include:  Veins that are enlarged, bulging, or twisted (varicose veins).  Skin breakdown or ulcers.  Reddened skin or dark discoloration of skin on the leg between the knee and ankle.  Brown, smooth, tight, and painful skin just above the ankle, usually on the inside of the leg (lipodermatosclerosis).  Swelling of the legs. How is this diagnosed? This condition may be diagnosed based on:  Your medical history.  A physical  exam.  Tests, such as: ? A procedure that creates an image of a blood vessel and nearby organs and provides information about blood flow through the blood vessel (duplex ultrasound). ? A procedure that tests blood flow (plethysmography). ? A procedure that looks at the veins using X-ray and dye (venogram). How is this treated? The goals of treatment are to help you return to an active life and to minimize pain or disability. Treatment depends on the severity of your condition, and it may include:  Wearing compression stockings. These can help relieve symptoms and help prevent your condition from getting worse. However, they do not cure the condition.  Sclerotherapy. This procedure involves an injection of a solution that shrinks damaged veins.  Surgery. This may involve: ? Removing a diseased vein (vein stripping). ? Cutting off blood flow through the vein (laser ablation surgery). ? Repairing or reconstructing a valve within the affected vein. Follow these instructions at home:      Wear compression stockings as told by your health care provider. These stockings help to prevent blood clots and reduce swelling in your legs.  Take over-the-counter and prescription medicines only as told by your health care provider.  Stay active by exercising, walking, or doing different activities. Ask your health care provider what activities are safe for you and how much exercise you need.  Drink enough fluid to keep your urine pale yellow.  Do not use any products that contain nicotine or tobacco, such as cigarettes, e-cigarettes, and chewing tobacco. If you need help quitting, ask your health care provider.  Keep all follow-up visits as told by your health care provider. This is important. Contact a health care provider if you:  Have redness, swelling, or more pain in the affected area.  See a red streak or line that goes up or down from the affected area.  Have skin breakdown or skin loss  in the affected area, even if the breakdown is small.  Get an injury in the affected area. Get help right away if:  You get an injury and an open wound in the affected area.  You have: ? Severe pain that does not get better with medicine. ? Sudden numbness or weakness in the foot or ankle below the affected area. ? Trouble moving your foot or ankle. ? A fever. ? Worse or persistent symptoms. ? Chest pain. ? Shortness of breath. Summary  Chronic venous insufficiency is a condition where the leg veins cannot effectively pump blood from the legs to the heart.  Chronic venous insufficiency occurs when the vein walls become stretched, weakened, or damaged, or when valves within the vein are damaged.  Treatment depends on how severe your condition is. It often involves wearing compression stockings and may involve having a procedure.  Make sure you stay active by exercising, walking, or doing different activities. Ask your health care provider what activities are safe for you and how much exercise you need. This information is not intended to replace advice given to you by your health care provider. Make sure you discuss any questions you have with your health care provider. Document Released: 04/09/2007 Document Revised: 08/27/2018 Document Reviewed: 08/27/2018 Elsevier Patient Education  2020 [employer].

## 2019-09-22 NOTE — Progress Notes (Signed)
Referred by:  Leone Haven, MD [address] [place],  [place] [address]  Reason for referral: bilateral lower legs and feet swelling x 1 year  History of Present Illness  Brittany Santos is a 58 y.o. (July 26, 1961) female who presents with chief complaint: swelling in both lower legs x 1 year, and erythema at both lower calves x 1 year, both are worsening. The patient's symptoms include: aching in both calves.  The patient has had no history of DVT, no history of pregnancy, + history of varicose veins since age 64, no history of venous stasis ulcers, no history of  Lymphedema and no history of skin changes in lower legs.  There is family history of venous disorders, her mother and sister have varicose veins.  The patient has used knee high and thigh high compression stockings in the past. She used thigh high compression hose during hospitalizations for GB surgery and c-spine surgery.   She has pain in her right hip that radiates down her leg, happens standing, walking, or sitting. This is being evaluated, has received injections in her right hip for this, states it helped a bit.  She states she fractured L5 about 3 years ago. She also states that she had some surgery on her c-spine.  She has DM, states her last A1C was 6.2.  She denies ever using tobacco.   She is not taking a daily ASA nor a statin.   She states her blood pressure at home is 130's/70's    Past Medical History:  Diagnosis Date  . Allergy   . Arthritis   . Diabetes mellitus without complication ([place])   . Family history of breast cancer    11/19 genetic testing letter sent  . GERD (gastroesophageal reflux disease)   . History of colon polyps   . History of diverticulitis   . History of kidney stones 04/15/15    Past Surgical History:  Procedure Laterality Date  . BILATERAL CARPAL TUNNEL RELEASE    . BREAST SURGERY    . cervical neck fusion    . CHOLECYSTECTOMY    .  ESOPHAGOGASTRODUODENOSCOPY (EGD) WITH PROPOFOL N/A 05/12/2016   Procedure: ESOPHAGOGASTRODUODENOSCOPY (EGD) WITH PROPOFOL;  Surgeon: Lollie Sails, MD;  Location: [facility] ENDOSCOPY;  Service: Endoscopy;  Laterality: N/A;    Social History   Socioeconomic History  . Marital status: Single    Spouse name: Not on file  . Number of children: Not on file  . Years of education: Not on file  . Highest education level: Not on file  Occupational History  . Not on file  Social Needs  . Financial resource strain: Not on file  . Food insecurity    Worry: Not on file    Inability: Not on file  . Transportation needs    Medical: Not on file    Non-medical: Not on file  Tobacco Use  . Smoking status: Never Smoker  . Smokeless tobacco: Never Used  Substance and Sexual Activity  . Alcohol use: No    Alcohol/week: 0.0 standard drinks  . Drug use: No  . Sexual activity: Not Currently    Birth control/protection: Post-menopausal  Lifestyle  . Physical activity    Days per week: Not on file    Minutes per session: Not on file  . Stress: Not on file  Relationships  . Social Herbalist on phone: Not on file    Gets together: Not on file  Attends religious service: Not on file    Active member of club or organization: Not on file    Attends meetings of clubs or organizations: Not on file    Relationship status: Not on file  . Intimate partner violence    Fear of current or ex partner: Not on file    Emotionally abused: Not on file    Physically abused: Not on file    Forced sexual activity: Not on file  Other Topics Concern  . Not on file  Social History Narrative   Works- [employer]    Mother lives with pt   No children    1 dog lives inside    Right handed    Caffeine- 1 piece chocolate occasionally    Associate degree   Enjoys- gardening and going to concerts     Family History  Problem Relation Age of Onset  . Hypertension Brother   . Breast cancer  Mother 60  . Cancer Sister 62       Blood cancer (unknown)  . Cancer Maternal Aunt        Lung  . Cancer Maternal Uncle 30       kidney  . Heart attack Paternal Uncle   . Aneurysm Paternal Grandmother   . Diabetes Neg Hx     Current Outpatient Medications on File Prior to Visit  Medication Sig Dispense Refill  . BIOTIN PO Take 1 tablet by mouth daily.    . Calcium-Magnesium-Vitamin D (CALCIUM 500 PO) Take by mouth.    . cetirizine (ZYRTEC) 10 MG tablet Take 10 mg by mouth daily.    Marland Kitchen glucose blood test strip Use as instructed Dx: E11.9 100 each 12  . JARDIANCE 10 MG TABS tablet TAKE 1 TABLET BY MOUTH DAILY 30 tablet 2  . Lancet Devices (ONE TOUCH DELICA LANCING DEV) MISC Use as directed Dx: E11.9 1 each 0  . magic mouthwash SOLN Take 10 mLs by mouth 4 (four) times daily as needed (throat pain). 120 mL 0  . metFORMIN (GLUCOPHAGE XR) 500 MG 24 hr tablet Take 2 tablets (1,000 mg total) by mouth 2 (two) times daily with a meal. 360 tablet 1  . Multiple Vitamin (MULTIVITAMIN) capsule Take 1 capsule by mouth daily.    . pantoprazole (PROTONIX) 40 MG tablet Take 1 tablet (40 mg total) by mouth 2 (two) times daily. 60 tablet 5  . progesterone (PROMETRIUM) 100 MG capsule TAKE 1 CAPSULE EVERY DAY 90 capsule 4  . traMADol (ULTRAM) 50 MG tablet Take 50 mg by mouth every 6 (six) hours as needed.    . vitamin B-12 (CYANOCOBALAMIN) 100 MCG tablet Take 100 mcg by mouth daily.    . diclofenac (VOLTAREN) 75 MG EC tablet      No current facility-administered medications on file prior to visit.     Allergies  Allergen Reactions  . Clarithromycin     Bad taste in mouth and made her feel nauseated   . Vicodin [Hydrocodone-Acetaminophen] Itching and Rash    REVIEW OF SYSTEMS: Cardiovascular: No chest pain, chest pressure, palpitations, orthopnea, or dyspnea on exertion. No claudication or rest pain,  No history of DVT or phlebitis. Pulmonary: No productive cough, asthma or wheezing. Neurologic: No  weakness, + occasional paresthesias in right foot, no aphasia, or amaurosis. No dizziness. Hematologic: No bleeding problems or clotting disorders. Musculoskeletal: + joint pain in right shoulder and both knees Gastrointestinal: No blood in stool or hematemesis Genitourinary: No dysuria or  hematuria. Psychiatric:: No history of major depression. Integumentary: No rashes or ulcers. Constitutional: No fever or chills.  Physical Examination Vitals:   09/22/19 1619 09/22/19 1623  BP: (!) 171/91 (!) 144/93  Pulse: 84 84  Resp: 16   Temp: 97.8 F (36.6 C)   TempSrc: Temporal   SpO2: 98%   Weight: 225 lb (102.1 kg)   Height: 5\' 4"  (1.626 m)    Body mass index is 38.62 kg/m.  PHYSICAL EXAMINATION: General: Obese female in NAD  HEENT:  No gross abnormalities Pulmonary: Respirations are non-labored, CTAB, good air movement in all fields Abdomen: Soft and non-tender with normal bowel sounds. Musculoskeletal: There are no major deformities.   Neurologic: No focal weakness or paresthesias are detected, muscle strength in all extremities is 5/5, except 4/5 in right lower extremity.  Skin: There are no ulcer or rashes noted.  Mild erythema both lower calves, multiple small varicosities, no pitting nor non pitting edema     Psychiatric: The patient has normal affect. Cardiovascular: There is a regular rate and rhythm without significant murmur appreciated.   Vascular: Vessel Right Left  Radial 2+Palpable 2+Palpable  Carotid Palpable, without bruit Palpable, without bruit  Aorta notNot palpable N/A  Popliteal Not palpable Not palpable  PT Not Palpable 1+Palpable  DP 1+Palpable 2+Palpable   Non-Invasive Vascular Imaging  BLE Venous Duplex (Date: 09/22/2019):  Venous Reflux Times Normal value < 0.5 sec +------------------------------+----------+---------+                               Right (ms)Left (ms) +------------------------------+----------+---------+ CFV                                      1570.00   +------------------------------+----------+---------+ FV                            1115.00             +------------------------------+----------+---------+ GSV at Saphenofemoral junction4144.00   763.00    +------------------------------+----------+---------+ GSV prox thigh                990.00    2413.00   +------------------------------+----------+---------+ GSV mid thigh                 733.00    513.00    +------------------------------+----------+---------+ GSV dist thigh                616.00    1196.00   +------------------------------+----------+---------+ GSV at knee                   704.00    4562.00   +------------------------------+----------+---------+ GSV prox calf                 5384.00   5883.00   +------------------------------+----------+---------+ GSV mid calf                  2435.00   6015.00   +------------------------------+----------+---------+ SSV origin                    565.00    858.00    +------------------------------+----------+---------+ SSV prox                      2846.00   1049.00   +------------------------------+----------+---------+  +------------------------------+----------+---------+ VEIN DIAMETERS:  Right (cm)Left (cm) +------------------------------+----------+---------+ GSV at Saphenofemoral junction1.09      0.95      +------------------------------+----------+---------+ GSV at prox thigh             0.84      0.68      +------------------------------+----------+---------+ GSV at mid thigh              0.35      0.62      +------------------------------+----------+---------+ GSV at distal thigh           0.58      0.55      +------------------------------+----------+---------+ GSV at knee                   0.32      0.58      +------------------------------+----------+---------+ GSV prox calf                 0.36       0.40      +------------------------------+----------+---------+ GSV mid calf                  0.31      0.31      +------------------------------+----------+---------+ SSV origin                    0.40      0.45      +------------------------------+----------+---------+ SSV prox                      0.38      0.51      +------------------------------+----------+---------+ SSV mid                       0.32      0.57      +------------------------------+----------+---------+   Right Reflux Technical Findings: Incompetent possible perforator noted in the mid calf.   Summary: Right: No reflux was noted in the common femoral vein. Abnormal reflux times were noted in the femoral vein in the thigh, great saphenous vein at the saphenofemoral junction, great saphenous vein at the proximal thigh, great saphenous vein at the mid  thigh, great saphenous vein at the distal thigh, great saphenous vein at the knee, great saphenous vein at the prox calf, great saphenous vein at the mid calf, origin of the small saphenous vein, and proximal small saphenous vein. There is no evidence of  deep vein thrombosis in the lower extremity.There is no evidence of superficial venous thrombosis. No evidence of deep vein thrombosis in the common femoral, femoral, or popliteal veins. Left: Abnormal reflux times were noted in the common femoral vein, great saphenous vein at the saphenofemoral junction, great saphenous vein at the proximal thigh, great saphenous vein at the mid thigh, great saphenous vein at the distal thigh, great  saphenous vein at the knee, great saphenous vein at the proximal calf, great saphenous vein at the mid calf, origin of the small saphenous vein, and prox small saphenous vein. There is no evidence of deep vein thrombosis in the lower extremity.There is  no evidence of superficial venous thrombosis. No evidence of deep vein thrombosis in the common femoral, femoral, or popliteal  veins.   Medical Decision Making  Daeja Ogbonna Tyrell is a 58 y.o. female who presents with: lower legs swelling since about age 48. She reports increased swelling and some mild erythema at her lower calves in the last year.  She has worn thigh high compression hose  only in the hospital after surgery, and has tried knee high compression hose at home.  She has palpable pedal pulses: good arterial perfusion to her feet.   She works from home which involves sitting at her desk, working on a computer, working at a job that [employer].    Based on the patient's history and examination, I recommend: graduated compression and elevation of her legs.  I discussed with the patient the use of 20-30 mm thigh high graduated compression stockings and need for 3 month trial of such.  I also discussed with her the optimal way to elevate her legs; see Patient Instructions.   The patient will follow up in 3 months with Dr. Scot Dock or Dr. Oneida Alar in the [facility] for evaluation for: continued treatment for chronic venous insufficiency.    Thank you for allowing [place] to participate in this patient's care.  Clemon Chambers, RN, MSN, FNP-C [facility] Office: [phone]  09/22/2019, 4:32 PM  Clinic MD: Trula Slade

## 2019-09-29 DIAGNOSIS — G4733 Obstructive sleep apnea (adult) (pediatric): Secondary | ICD-10-CM | POA: Diagnosis not present

## 2019-10-07 ENCOUNTER — Telehealth: Payer: Self-pay | Admitting: Family Medicine

## 2019-10-07 NOTE — Telephone Encounter (Signed)
Disability parking placard form was dropped off. Its in color folder up front. Thank you!

## 2019-10-09 NOTE — Telephone Encounter (Signed)
handicap renewal form in signed box for you to sign.  Luvia Orzechowski,cma

## 2019-10-12 NOTE — Telephone Encounter (Signed)
Please confirm the reason she needs the handicap placard. Is she able to walk 200 feet without stopping? Does she use a cane or walker or another person to help her walk? Thanks.

## 2019-10-13 DIAGNOSIS — Z0279 Encounter for issue of other medical certificate: Secondary | ICD-10-CM

## 2019-10-13 NOTE — Telephone Encounter (Signed)
Called patient and informed her that the form for the handicap placard is ready to pick up at the front desk. Shervon Kerwin,cma

## 2019-10-13 NOTE — Telephone Encounter (Signed)
Form signed. Please fill in our information and make available for pick up. Thanks.

## 2019-10-13 NOTE — Telephone Encounter (Signed)
I called the patient and she states she is still under treatment for her hip and someday's she can walk 200 feet without stopping and some days she cannot, she states she is using a cane. She states she can pick the form up today around 1:30 pm if that's ok.  Tilla Wilborn,cma

## 2019-11-20 DIAGNOSIS — G5701 Lesion of sciatic nerve, right lower limb: Secondary | ICD-10-CM | POA: Diagnosis not present

## 2019-12-02 ENCOUNTER — Other Ambulatory Visit: Payer: Self-pay | Admitting: Family Medicine

## 2019-12-04 ENCOUNTER — Telehealth: Payer: Self-pay

## 2019-12-04 NOTE — Telephone Encounter (Signed)
Pt calling needing a refill on her Progesterone she has a annul scheduled for 01/15/20

## 2019-12-04 NOTE — Telephone Encounter (Signed)
Okay for 1 refill

## 2019-12-04 NOTE — Telephone Encounter (Signed)
Is this okay?

## 2019-12-05 NOTE — Telephone Encounter (Signed)
Patient has checked with pharmacy and they do not have her Rx, if this could be sent ASAP, she is completely out.  [facility].

## 2019-12-08 ENCOUNTER — Other Ambulatory Visit: Payer: Self-pay | Admitting: Obstetrics & Gynecology

## 2019-12-08 MED ORDER — PROGESTERONE MICRONIZED 100 MG PO CAPS: 100.0000 mg | ORAL_CAPSULE | Freq: Every day | ORAL | 0 refills | Status: DC

## 2019-12-08 NOTE — Telephone Encounter (Signed)
Rx RF paper request faxed back with RF given per Dr. Gilman Schmidt.

## 2019-12-08 NOTE — Telephone Encounter (Signed)
I am just now seeing this.

## 2019-12-16 ENCOUNTER — Telehealth: Payer: Self-pay | Admitting: Family Medicine

## 2019-12-16 DIAGNOSIS — E119 Type 2 diabetes mellitus without complications: Secondary | ICD-10-CM

## 2019-12-16 DIAGNOSIS — I10 Essential (primary) hypertension: Secondary | ICD-10-CM

## 2019-12-16 NOTE — Telephone Encounter (Signed)
Patient requested labs has to pay out of pocket for a visit, rescheduled visit.  Aleli Navedo,cma

## 2019-12-16 NOTE — Telephone Encounter (Signed)
Pt wants to know if she needs labs before f/u appt. She also does not want a virtual appointment because she has to pay out of pocket for it. Pt would like a call back to discuss options concerning bloodwork and appt. Please call on mobile. Thanks!

## 2019-12-18 ENCOUNTER — Other Ambulatory Visit: Payer: Self-pay

## 2019-12-18 ENCOUNTER — Other Ambulatory Visit (INDEPENDENT_AMBULATORY_CARE_PROVIDER_SITE_OTHER): Payer: BC Managed Care – PPO

## 2019-12-18 DIAGNOSIS — E119 Type 2 diabetes mellitus without complications: Secondary | ICD-10-CM | POA: Diagnosis not present

## 2019-12-18 DIAGNOSIS — I10 Essential (primary) hypertension: Secondary | ICD-10-CM | POA: Diagnosis not present

## 2019-12-18 LAB — COMPREHENSIVE METABOLIC PANEL
ALT: 15 U/L (ref 0–35)
AST: 16 U/L (ref 0–37)
Albumin: 4.3 g/dL (ref 3.5–5.2)
Alkaline Phosphatase: 66 U/L (ref 39–117)
BUN: 17 mg/dL (ref 6–23)
CO2: 32 mEq/L (ref 19–32)
Calcium: 9.6 mg/dL (ref 8.4–10.5)
Chloride: 102 mEq/L (ref 96–112)
Creatinine, Ser: 0.86 mg/dL (ref 0.40–1.20)
GFR: 67.35 mL/min (ref >60.00)
Glucose, Bld: 132 mg/dL — ABNORMAL HIGH (ref 70–99)
Potassium: 4.1 mEq/L (ref 3.5–5.1)
Sodium: 140 mEq/L (ref 135–145)
Total Bilirubin: 0.5 mg/dL (ref 0.2–1.2)
Total Protein: 6.8 g/dL (ref 6.0–8.3)

## 2019-12-18 LAB — LIPID PANEL
Cholesterol: 145 mg/dL (ref 0–200)
HDL: 60.7 mg/dL (ref >39.00)
LDL Cholesterol: 70 mg/dL (ref 0–99)
NonHDL: 83.98
Total CHOL/HDL Ratio: 2
Triglycerides: 71 mg/dL (ref 0.0–149.0)
VLDL: 14.2 mg/dL (ref 0.0–40.0)

## 2019-12-18 LAB — HEMOGLOBIN A1C: Hgb A1c MFr Bld: 6.8 % — ABNORMAL HIGH (ref 4.6–6.5)

## 2019-12-23 ENCOUNTER — Other Ambulatory Visit: Payer: Self-pay | Admitting: Gastroenterology

## 2019-12-29 ENCOUNTER — Ambulatory Visit: Payer: BC Managed Care – PPO | Admitting: Family Medicine

## 2019-12-29 ENCOUNTER — Encounter: Payer: Self-pay | Admitting: Family Medicine

## 2019-12-30 ENCOUNTER — Other Ambulatory Visit: Payer: Self-pay | Admitting: Family Medicine

## 2020-01-07 ENCOUNTER — Ambulatory Visit: Payer: BC Managed Care – PPO | Admitting: Vascular Surgery

## 2020-01-09 ENCOUNTER — Ambulatory Visit: Payer: BLUE CROSS/BLUE SHIELD | Admitting: Obstetrics and Gynecology

## 2020-01-09 ENCOUNTER — Other Ambulatory Visit: Payer: Self-pay

## 2020-01-09 ENCOUNTER — Ambulatory Visit (INDEPENDENT_AMBULATORY_CARE_PROVIDER_SITE_OTHER): Payer: BC Managed Care – PPO | Admitting: Family Medicine

## 2020-01-09 ENCOUNTER — Encounter: Payer: Self-pay | Admitting: Family Medicine

## 2020-01-09 DIAGNOSIS — G4733 Obstructive sleep apnea (adult) (pediatric): Secondary | ICD-10-CM

## 2020-01-09 DIAGNOSIS — I10 Essential (primary) hypertension: Secondary | ICD-10-CM | POA: Diagnosis not present

## 2020-01-09 DIAGNOSIS — J3489 Other specified disorders of nose and nasal sinuses: Secondary | ICD-10-CM | POA: Insufficient documentation

## 2020-01-09 DIAGNOSIS — E119 Type 2 diabetes mellitus without complications: Secondary | ICD-10-CM

## 2020-01-09 MED ORDER — GLUCOSE BLOOD VI STRP: ORAL_STRIP | 12 refills | Status: DC

## 2020-01-09 NOTE — Assessment & Plan Note (Signed)
Well-controlled.  Patient has 100% compliance with this with adequate control based on AHI score.  We will contact her home health company to see if there is anything we need to do given that she has had insurance issues having her CPAP covered.

## 2020-01-09 NOTE — Progress Notes (Signed)
Virtual Visit via video Note  This visit type was conducted due to national recommendations for restrictions regarding the COVID-19 pandemic (e.g. social distancing).  This format is felt to be most appropriate for this patient at this time.  All issues noted in this document were discussed and addressed.  No physical exam was performed (except for noted visual exam findings with Video Visits).   I connected with Brittany Santos today at  4:00 PM EST by a video enabled telemedicine application and verified that I am speaking with the correct person using two identifiers. Location patient: home Location provider: work Persons participating in the virtual visit: patient, provider  I discussed the limitations, risks, security and privacy concerns of performing an evaluation and management service by telephone and the availability of in person appointments. I also discussed with the patient that there may be a patient responsible charge related to this service. The patient expressed understanding and agreed to proceed.  Reason for visit: follow-up  HPI: OSA: hours 6-8 hours a night. No hypersomnia. Does wake well rested.   Diabetes: Typically around 150-170.  No polyuria or polydipsia.  No hypoglycemia.  Continuing with Jardiance and Metformin.  Hypertension: Typically adequately controlled 120-122/80.  No chest pain or shortness of breath.  Chronic sinus drainage: This has been an ongoing issue through healthy years.  She follows with ENT though has not been able to see them related to the COVID-19 pandemic.   ROS: See pertinent positives and negatives per HPI.  Past Medical History:  Diagnosis Date  . Allergy   . Arthritis   . Diabetes mellitus without complication ([place])   . Family history of breast cancer    11/19 genetic testing letter sent  . GERD (gastroesophageal reflux disease)   . History of colon polyps   . History of diverticulitis   . History of kidney stones 04/15/15     Past Surgical History:  Procedure Laterality Date  . BILATERAL CARPAL TUNNEL RELEASE    . BREAST SURGERY    . cervical neck fusion    . CHOLECYSTECTOMY    . ESOPHAGOGASTRODUODENOSCOPY (EGD) WITH PROPOFOL N/A 05/12/2016   Procedure: ESOPHAGOGASTRODUODENOSCOPY (EGD) WITH PROPOFOL;  Surgeon: Lollie Sails, MD;  Location: [facility] ENDOSCOPY;  Service: Endoscopy;  Laterality: N/A;    Family History  Problem Relation Age of Onset  . Hypertension Brother   . Breast cancer Mother 56  . Cancer Sister 40       Blood cancer (unknown)  . Cancer Maternal Aunt        Lung  . Cancer Maternal Uncle 30       kidney  . Heart attack Paternal Uncle   . Aneurysm Paternal Grandmother   . Diabetes Neg Hx     SOCIAL HX: Non-smoker   Current Outpatient Medications:  .  BIOTIN PO, Take 1 tablet by mouth daily., Disp: , Rfl:  .  Calcium-Magnesium-Vitamin D (CALCIUM 500 PO), Take by mouth., Disp: , Rfl:  .  cetirizine (ZYRTEC) 10 MG tablet, Take 10 mg by mouth daily., Disp: , Rfl:  .  diclofenac (VOLTAREN) 75 MG EC tablet, , Disp: , Rfl:  .  JARDIANCE 10 MG TABS tablet, TAKE 1 TABLET BY MOUTH DAILY, Disp: 90 tablet, Rfl: 1 .  Lancet Devices (ONE TOUCH DELICA LANCING DEV) MISC, Use as directed Dx: E11.9, Disp: 1 each, Rfl: 0 .  metFORMIN (GLUCOPHAGE-XR) 500 MG 24 hr tablet, TAKE 2 TABLETS (=1,000MG    TOTAL) 2 TIMES DAILY WITH  MEALS, Disp: 360 tablet, Rfl: 1 .  Multiple Vitamin (MULTIVITAMIN) capsule, Take 1 capsule by mouth daily., Disp: , Rfl:  .  pantoprazole (PROTONIX) 40 MG tablet, Take 1 tablet (40 mg total) by mouth 2 (two) times daily. **PLEASE SCHEDULE FOLLOW UP APPT**, Disp: 60 tablet, Rfl: 3 .  progesterone (PROMETRIUM) 100 MG capsule, Take 1 capsule (100 mg total) by mouth daily., Disp: 90 capsule, Rfl: 0 .  traMADol (ULTRAM) 50 MG tablet, Take 50 mg by mouth every 6 (six) hours as needed., Disp: , Rfl:  .  vitamin B-12 (CYANOCOBALAMIN) 100 MCG tablet, Take 100 mcg by mouth daily., Disp:  , Rfl:  .  glucose blood test strip, Dispense based on patient preference and insurance preference, Use as once daily Dx: E11.9, Disp: 100 each, Rfl: 12 .  magic mouthwash SOLN, Take 10 mLs by mouth 4 (four) times daily as needed (throat pain)., Disp: 120 mL, Rfl: 0  EXAM:  VITALS per patient if applicable:  GENERAL: alert, oriented, appears well and in no acute distress  HEENT: atraumatic, conjunttiva clear, no obvious abnormalities on inspection of external nose and ears  NECK: normal movements of the head and neck  LUNGS: on inspection no signs of respiratory distress, breathing rate appears normal, no obvious gross SOB, gasping or wheezing  CV: no obvious cyanosis  MS: moves all visible extremities without noticeable abnormality  PSYCH/NEURO: pleasant and cooperative, no obvious depression or anxiety, speech and thought processing grossly intact  ASSESSMENT AND PLAN:  Discussed the following assessment and plan:  OSA (obstructive sleep apnea) Well-controlled.  Patient has 100% compliance with this with adequate control based on AHI score.  We will contact her home health company to see if there is anything we need to do given that she has had insurance issues having her CPAP covered.  Hypertension Well-controlled.  Type 2 diabetes mellitus Continue current medication.  Jardiance is difficult for her to afford at times.  Discussed using a coupon card for this.  Adequately controlled.  I did discuss possibly adding a statin though she is hesitant to do this given how many medications she takes.  Advised on diet and exercise to help get her LDL to a goal of less than 70.  Sinus drainage Chronic issue.  I discussed that the ENT office locally seems to be seeing patients now and I encouraged her to contact them to schedule an appointment.   Orders Placed This Encounter  Procedures  . Amb ref to Medical Nutrition Therapy-MNT    Referral Priority:   Routine    Referral Type:    Consultation    Referral Reason:   Specialty Services Required    Requested Specialty:   Nutrition    Number of Visits Requested:   1    Meds ordered this encounter  Medications  . glucose blood test strip    Sig: Dispense based on patient preference and insurance preference, Use as once daily Dx: E11.9    Dispense:  100 each    Refill:  12     I discussed the assessment and treatment plan with the patient. The patient was provided an opportunity to ask questions and all were answered. The patient agreed with the plan and demonstrated an understanding of the instructions.   The patient was advised to call back or seek an in-person evaluation if the symptoms worsen or if the condition fails to improve as anticipated.    [name], MD

## 2020-01-09 NOTE — Telephone Encounter (Signed)
Compliance report received.  She had 100% compliance greater than 4 hours.  AHI of 0.6.  We will discuss further at her visit later today.

## 2020-01-09 NOTE — Assessment & Plan Note (Addendum)
Continue current medication.  Jardiance is difficult for her to afford at times.  Discussed using a coupon card for this.  Adequately controlled.  I did discuss possibly adding a statin though she is hesitant to do this given how many medications she takes.  Advised on diet and exercise to help get her LDL to a goal of less than 70.

## 2020-01-09 NOTE — Assessment & Plan Note (Signed)
Well controlled 

## 2020-01-09 NOTE — Assessment & Plan Note (Signed)
Chronic issue.  I discussed that the ENT office locally seems to be seeing patients now and I encouraged her to contact them to schedule an appointment.

## 2020-01-14 ENCOUNTER — Encounter: Payer: Self-pay | Admitting: Obstetrics and Gynecology

## 2020-01-14 ENCOUNTER — Telehealth (HOSPITAL_COMMUNITY): Payer: Self-pay

## 2020-01-14 NOTE — Progress Notes (Addendum)
PCP: Leone Haven, MD   Chief Complaint  Patient presents with  . Gynecologic Exam    HPI:      [name] is a 59 y.o. G0P0000 who LMP was No LMP recorded (lmp unknown). Patient is postmenopausal., presents today for her annual examination.  Her menses are absent due to menopause. She does not have PMB. She does not have vasomotor sx with daily prometrium use.   Sex activity: not sexually active. She does not have vaginal dryness.  Last Pap: 09/28/17  Results were: no abnormalities /neg HPV DNA.   Last mammogram: 08/11/19  Results were: normal--routine follow-up in 12 months There is a FH of breast cancer in her mom twice, genetic testing not done. There is no FH of ovarian cancer. There is a FH of prostate and kidney cancer in her mat uncles. The patient does do self-breast exams.  Colonoscopy: 2016 and 2017 with Dr. Gustavo Lah;  Repeat due 2021 per Dr. Allen Norris in 2019.   Tobacco use: The patient denies current or previous tobacco use. Alcohol use: none  No drug use Exercise: min active due to hip injury requiring steroid injections.  She does get adequate calcium and Vitamin D in her diet.  Labs with PCP.   Patient Active Problem List   Diagnosis Date Noted  . Family history of breast cancer 01/15/2020  . Vasomotor symptoms due to menopause 01/15/2020  . Sinus drainage 01/09/2020  . Tonsil pain 08/27/2019  . Bilateral leg edema 04/26/2018  . OSA (obstructive sleep apnea) 04/26/2018  . Foot callus 04/26/2018  . Hypertension 04/26/2018  . Back pain, chronic 07/06/2016  . Skin lesion 06/19/2016  . IBS (irritable bowel syndrome) 12/21/2015  . GERD (gastroesophageal reflux disease) 04/21/2015  . Type 2 diabetes mellitus ([place]) 01/20/2015    Past Surgical History:  Procedure Laterality Date  . BILATERAL CARPAL TUNNEL RELEASE    . BREAST SURGERY    . cervical neck fusion    . CHOLECYSTECTOMY    . COLONOSCOPY  2017   Dr. Gustavo Lah with polyp; repeat  due 2021  . ESOPHAGOGASTRODUODENOSCOPY (EGD) WITH PROPOFOL N/A 05/12/2016   Procedure: ESOPHAGOGASTRODUODENOSCOPY (EGD) WITH PROPOFOL;  Surgeon: Lollie Sails, MD;  Location: [facility] ENDOSCOPY;  Service: Endoscopy;  Laterality: N/A;    Family History  Problem Relation Age of Onset  . Hypertension Brother   . Breast cancer Mother        twice--30s and 55 (bilat breasts)  . Cancer Sister 54       Blood cancer (unknown)  . Cancer Maternal Aunt        Lung  . Kidney cancer Maternal Uncle 59  . Heart attack Paternal Uncle   . Aneurysm Paternal Grandmother   . Prostate cancer Maternal Uncle 70       needed seed tx  . Diabetes Neg Hx     Social History   Socioeconomic History  . Marital status: Single    Spouse name: Not on file  . Number of children: Not on file  . Years of education: Not on file  . Highest education level: Not on file  Occupational History  . Not on file  Tobacco Use  . Smoking status: Never Smoker  . Smokeless tobacco: Never Used  Substance and Sexual Activity  . Alcohol use: No    Alcohol/week: 0.0 standard drinks  . Drug use: No  . Sexual activity: Not Currently    Birth control/protection: Post-menopausal  Other Topics Concern  .  Not on file  Social History Narrative   Works- [employer]    Mother lives with pt   No children    1 dog lives inside    Right handed    Caffeine- 1 piece chocolate occasionally    Associate degree   Enjoys- gardening and going to concerts    Social Determinants of Health   Financial Resource Strain:   . Difficulty of Paying Living Expenses: Not on file  Food Insecurity:   . Worried About Charity fundraiser in the Last Year: Not on file  . Ran Out of Food in the Last Year: Not on file  Transportation Needs:   . Lack of Transportation (Medical): Not on file  . Lack of Transportation (Non-Medical): Not on file  Physical Activity:   . Days of Exercise per Week: Not on file  . Minutes of Exercise per  Session: Not on file  Stress:   . Feeling of Stress : Not on file  Social Connections:   . Frequency of Communication with Friends and Family: Not on file  . Frequency of Social Gatherings with Friends and Family: Not on file  . Attends Religious Services: Not on file  . Active Member of Clubs or Organizations: Not on file  . Attends Archivist Meetings: Not on file  . Marital Status: Not on file  Intimate Partner Violence:   . Fear of Current or Ex-Partner: Not on file  . Emotionally Abused: Not on file  . Physically Abused: Not on file  . Sexually Abused: Not on file     Current Outpatient Medications:  .  BIOTIN PO, Take 1 tablet by mouth daily., Disp: , Rfl:  .  Calcium-Magnesium-Vitamin D (CALCIUM 500 PO), Take by mouth., Disp: , Rfl:  .  cetirizine (ZYRTEC) 10 MG tablet, Take 10 mg by mouth daily., Disp: , Rfl:  .  diclofenac (VOLTAREN) 75 MG EC tablet, , Disp: , Rfl:  .  glucose blood test strip, Dispense based on patient preference and insurance preference, Use as once daily Dx: E11.9, Disp: 100 each, Rfl: 12 .  JARDIANCE 10 MG TABS tablet, TAKE 1 TABLET BY MOUTH DAILY, Disp: 90 tablet, Rfl: 1 .  Lancet Devices (ONE TOUCH DELICA LANCING DEV) MISC, Use as directed Dx: E11.9, Disp: 1 each, Rfl: 0 .  magic mouthwash SOLN, Take 10 mLs by mouth 4 (four) times daily as needed (throat pain)., Disp: 120 mL, Rfl: 0 .  metFORMIN (GLUCOPHAGE-XR) 500 MG 24 hr tablet, TAKE 2 TABLETS (=1,'000MG'$    TOTAL) 2 TIMES DAILY WITH  MEALS, Disp: 360 tablet, Rfl: 1 .  Multiple Vitamin (MULTIVITAMIN) capsule, Take 1 capsule by mouth daily., Disp: , Rfl:  .  pantoprazole (PROTONIX) 40 MG tablet, Take 1 tablet (40 mg total) by mouth 2 (two) times daily. **PLEASE SCHEDULE FOLLOW UP APPT**, Disp: 60 tablet, Rfl: 3 .  progesterone (PROMETRIUM) 100 MG capsule, Take 1 capsule (100 mg total) by mouth daily., Disp: 90 capsule, Rfl: 3 .  traMADol (ULTRAM) 50 MG tablet, Take 50 mg by mouth every 6 (six)  hours as needed., Disp: , Rfl:  .  vitamin B-12 (CYANOCOBALAMIN) 100 MCG tablet, Take 100 mcg by mouth daily., Disp: , Rfl:      ROS:  Review of Systems  Constitutional: Negative for fatigue, fever and unexpected weight change.  Respiratory: Negative for cough, shortness of breath and wheezing.   Cardiovascular: Negative for chest pain, palpitations and leg swelling.  Gastrointestinal:  Negative for blood in stool, constipation, diarrhea, nausea and vomiting.  Endocrine: Negative for cold intolerance, heat intolerance and polyuria.  Genitourinary: Negative for dyspareunia, dysuria, flank pain, frequency, genital sores, hematuria, menstrual problem, pelvic pain, urgency, vaginal bleeding, vaginal discharge and vaginal pain.  Musculoskeletal: Negative for back pain, joint swelling and myalgias.  Skin: Negative for rash.  Neurological: Negative for dizziness, syncope, light-headedness, numbness and headaches.  Hematological: Negative for adenopathy.  Psychiatric/Behavioral: Negative for agitation, confusion, sleep disturbance and suicidal ideas. The patient is not nervous/anxious.    BREAST: No symptoms    Objective: BP 134/80   Ht '5\' 4"'$  (1.626 m)   Wt 223 lb (101.2 kg)   LMP  (LMP Unknown) Comment: Patient states she had her "uterus scraped" sometime in the past 10 years.   BMI 38.28 kg/m    Physical Exam Constitutional:      Appearance: She is well-developed.  Genitourinary:     Vulva, vagina, cervix, uterus, right adnexa and left adnexa normal.     No vulval lesion or tenderness noted.     No vaginal discharge, erythema or tenderness.     No cervical polyp.     Uterus is not enlarged or tender.     No right or left adnexal mass present.     Right adnexa not tender.     Left adnexa not tender.  Neck:     Thyroid: No thyromegaly.  Cardiovascular:     Rate and Rhythm: Normal rate and regular rhythm.     Heart sounds: Normal heart sounds. No murmur.  Pulmonary:      Effort: Pulmonary effort is normal.     Breath sounds: Normal breath sounds.  Chest:     Breasts:        Right: No mass, nipple discharge, skin change or tenderness.        Left: No mass, nipple discharge, skin change or tenderness.  Abdominal:     Palpations: Abdomen is soft.     Tenderness: There is no abdominal tenderness. There is no guarding.  Musculoskeletal:        General: Normal range of motion.     Cervical back: Normal range of motion.  Neurological:     General: No focal deficit present.     Mental Status: She is alert and oriented to person, place, and time.     Cranial Nerves: No cranial nerve deficit.  Skin:    General: Skin is warm and dry.  Psychiatric:        Mood and Affect: Mood normal.        Behavior: Behavior normal.        Thought Content: Thought content normal.        Judgment: Judgment normal.  Vitals reviewed.    Assessment/Plan:  Encounter for annual routine gynecological examination  Encounter for screening mammogram for malignant neoplasm of breast - Plan: MM 3D SCREEN BREAST BILATERAL; pt to sched mammo  Family history of breast cancer - Plan: Integrated [facility] ([place]); MyRisk testing discussed and done today. Will call with results.   Screening for colon cancer - Plan: Ambulatory referral to Gastroenterology, pt to sched colonoscopy appt due this yr. Sees Dr. Allen Norris regularly anyway for protonix Rx.  Vasomotor symptoms due to menopause - Plan: progesterone (PROMETRIUM) 100 MG capsule; Rx RF prometrium. F/u prn.    Meds ordered this encounter  Medications  . progesterone (PROMETRIUM) 100 MG capsule    Sig: Take 1 capsule (100  mg total) by mouth daily.    Dispense:  90 capsule    Refill:  3    Order Specific Question:   Supervising Provider    Answer:   Gae Dry [[id number]]           GYN counsel mammography screening, menopause, adequate intake of calcium and vitamin D, diet and exercise    F/U  Return  in about 1 year (around 01/14/2021).  Daniyla Pfahler B. Johnae Friley, PA-C 01/15/2020 11:27 AM

## 2020-01-14 NOTE — Telephone Encounter (Signed)

## 2020-01-15 ENCOUNTER — Ambulatory Visit (INDEPENDENT_AMBULATORY_CARE_PROVIDER_SITE_OTHER): Payer: BC Managed Care – PPO | Admitting: Obstetrics and Gynecology

## 2020-01-15 ENCOUNTER — Encounter: Payer: Self-pay | Admitting: Vascular Surgery

## 2020-01-15 ENCOUNTER — Ambulatory Visit (INDEPENDENT_AMBULATORY_CARE_PROVIDER_SITE_OTHER): Payer: BC Managed Care – PPO | Admitting: Vascular Surgery

## 2020-01-15 ENCOUNTER — Encounter: Payer: Self-pay | Admitting: Obstetrics and Gynecology

## 2020-01-15 ENCOUNTER — Other Ambulatory Visit: Payer: Self-pay

## 2020-01-15 VITALS — BP 134/80 | Ht 64.0 in | Wt 223.0 lb

## 2020-01-15 VITALS — BP 145/88 | HR 107 | Temp 97.3°F | Resp 14 | Ht 63.0 in | Wt 220.0 lb

## 2020-01-15 DIAGNOSIS — N951 Menopausal and female climacteric states: Secondary | ICD-10-CM | POA: Diagnosis not present

## 2020-01-15 DIAGNOSIS — Z8042 Family history of malignant neoplasm of prostate: Secondary | ICD-10-CM | POA: Diagnosis not present

## 2020-01-15 DIAGNOSIS — Z01419 Encounter for gynecological examination (general) (routine) without abnormal findings: Secondary | ICD-10-CM | POA: Diagnosis not present

## 2020-01-15 DIAGNOSIS — Z801 Family history of malignant neoplasm of trachea, bronchus and lung: Secondary | ICD-10-CM | POA: Diagnosis not present

## 2020-01-15 DIAGNOSIS — I872 Venous insufficiency (chronic) (peripheral): Secondary | ICD-10-CM

## 2020-01-15 DIAGNOSIS — Z8051 Family history of malignant neoplasm of kidney: Secondary | ICD-10-CM | POA: Diagnosis not present

## 2020-01-15 DIAGNOSIS — Z803 Family history of malignant neoplasm of breast: Secondary | ICD-10-CM | POA: Insufficient documentation

## 2020-01-15 DIAGNOSIS — Z1231 Encounter for screening mammogram for malignant neoplasm of breast: Secondary | ICD-10-CM

## 2020-01-15 DIAGNOSIS — Z1211 Encounter for screening for malignant neoplasm of colon: Secondary | ICD-10-CM

## 2020-01-15 MED ORDER — PROGESTERONE MICRONIZED 100 MG PO CAPS: 100.0000 mg | ORAL_CAPSULE | Freq: Every day | ORAL | 3 refills | Status: DC

## 2020-01-15 NOTE — Patient Instructions (Signed)
I value your feedback and entrusting us with your care. If you get a [facility] patient survey, I would appreciate you taking the time to let us know about your experience today. Thank you!  As of November 27, 2019, your lab results will be released to your MyChart immediately, before I even have a chance to see them. Please give me time to review them and contact you if there are any abnormalities. Thank you for your patience.  

## 2020-01-15 NOTE — Progress Notes (Signed)
Patient name: Brittany Santos [id number] MRN: [id number] DOB: [birth year] Sex: female  REASON FOR VISIT:   Follow-up of chronic venous insufficiency.  HPI:   Brittany Santos [id number] is a pleasant 59 y.o. female who was seen by the nurse practitioner on 09/22/2019 with leg swelling.  She has had swelling in both legs for over a year.  She also described aching pain in her legs.  She denies any history of DVT.  She has had varicose veins since she was 59 years old.  The patient had used knee-high and thigh-high compression stockings in the past.  She has undergone previous gastric banding surgery.  On exam she had some telangiectasias and spider veins bilaterally in addition to some reticular veins.  She was encouraged to try thigh-high compression stockings with a gradient of 20 to 30 mmHg, elevate her legs, and take ibuprofen as needed for pain.  She was set up for a 31-month follow-up visit.  Since she was seen last, she continues to have significant aching pain and heaviness in both legs which is aggravated by sitting and standing.  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  She does elevate her legs some which helps.  Her thigh-high compression stockings help some also although they irritate her thighs especially where she has telangiectasias on her lateral thighs.  Patient has no previous history of DVT and no previous venous procedures.  Past Medical History:  Diagnosis Date   Allergy    Arthritis    Diabetes mellitus without complication ([place])    Family history of breast cancer    11/19 genetic testing letter sent   GERD (gastroesophageal reflux disease)    History of colon polyps 2016, 2017   at [facility]   History of diverticulitis    History of kidney stones 04/15/15    Family History  Problem Relation Age of Onset   Hypertension Brother    Breast cancer Mother        twice--30s and 63 (bilat breasts)   Cancer Sister 69        Blood cancer (unknown)   Cancer Maternal Aunt        Lung   Kidney cancer Maternal Uncle 78   Heart attack Paternal Uncle    Aneurysm Paternal Grandmother    Prostate cancer Maternal Uncle 79       needed seed tx   Prostate cancer Cousin 58   Diabetes Neg Hx     SOCIAL HISTORY: Social History   Tobacco Use   Smoking status: Never Smoker   Smokeless tobacco: Never Used  Substance Use Topics   Alcohol use: No    Alcohol/week: 0.0 standard drinks    Allergies  Allergen Reactions   Clarithromycin     Bad taste in mouth and made her feel nauseated    Vicodin [Hydrocodone-Acetaminophen] Itching and Rash    Current Outpatient Medications  Medication Sig Dispense Refill   BIOTIN PO Take 1 tablet by mouth daily.     Calcium-Magnesium-Vitamin D (CALCIUM 500 PO) Take by mouth.     cetirizine (ZYRTEC) 10 MG tablet Take 10 mg by mouth daily.     diclofenac (VOLTAREN) 75 MG EC tablet      glucose blood test strip Dispense based on patient preference and insurance preference, Use as once daily Dx: E11.9 100 each 12   JARDIANCE 10 MG TABS tablet TAKE 1 TABLET BY  MOUTH DAILY 90 tablet 1   Lancet Devices (ONE TOUCH DELICA LANCING DEV) MISC Use as directed Dx: E11.9 1 each 0   magic mouthwash SOLN Take 10 mLs by mouth 4 (four) times daily as needed (throat pain). 120 mL 0   metFORMIN (GLUCOPHAGE-XR) 500 MG 24 hr tablet TAKE 2 TABLETS (=1,000MG    TOTAL) 2 TIMES DAILY WITH  MEALS 360 tablet 1   Multiple Vitamin (MULTIVITAMIN) capsule Take 1 capsule by mouth daily.     pantoprazole (PROTONIX) 40 MG tablet Take 1 tablet (40 mg total) by mouth 2 (two) times daily. **PLEASE SCHEDULE FOLLOW UP APPT** 60 tablet 3   progesterone (PROMETRIUM) 100 MG capsule Take 1 capsule (100 mg total) by mouth daily. 90 capsule 3   traMADol (ULTRAM) 50 MG tablet Take 50 mg by mouth every 6 (six) hours as needed.     vitamin B-12 (CYANOCOBALAMIN) 100 MCG tablet Take 100 mcg by mouth  daily.     No current facility-administered medications for this visit.    REVIEW OF SYSTEMS:  [X]  denotes positive finding, [ ]  denotes negative finding Cardiac  Comments:  Chest pain or chest pressure:    Shortness of breath upon exertion:    Short of breath when lying flat:    Irregular heart rhythm:        Vascular    Pain in calf, thigh, or hip brought on by ambulation:    Pain in feet at night that wakes you up from your sleep:     Blood clot in your veins:    Leg swelling:  x       Pulmonary    Oxygen at home:    Productive cough:     Wheezing:         Neurologic    Sudden weakness in arms or legs:     Sudden numbness in arms or legs:     Sudden onset of difficulty speaking or slurred speech:    Temporary loss of vision in one eye:     Problems with dizziness:         Gastrointestinal    Blood in stool:     Vomited blood:         Genitourinary    Burning when urinating:     Blood in urine:        Psychiatric    Major depression:         Hematologic    Bleeding problems:    Problems with blood clotting too easily:        Skin    Rashes or ulcers:        Constitutional    Fever or chills:     PHYSICAL EXAM:   Vitals:   01/15/20 1613  BP: (!) 145/88  Pulse: (!) 107  Resp: 14  Temp: (!) 97.3 F (36.3 C)  TempSrc: Temporal  SpO2: 98%  Weight: 220 lb (99.8 kg)  Height: 5\' 3"  (1.6 m)    GENERAL: The patient is a well-nourished female, in no acute distress. The vital signs are documented above. CARDIAC: There is a regular rate and rhythm.  VASCULAR: I do not detect carotid bruits. She has biphasic dorsalis pedis and posterior tibial signals bilaterally. She has spider veins and telangiectasias bilaterally as documented below.         She has some hyperpigmentation bilaterally. I looked at both great saphenous veins myself with the SonoSite.  She does have reflux in the left great  saphenous vein down to the knee and the vein is  dilated. Likewise on the right side she has reflux in the great saphenous vein down to the distal thigh however there is an area of narrowing in the mid thigh. She also has significant reflux in the left small saphenous vein and this vein is also dilated. PULMONARY: There is good air exchange bilaterally without wheezing or rales. ABDOMEN: Soft and non-tender with normal pitched bowel sounds.  MUSCULOSKELETAL: There are no major deformities or cyanosis. NEUROLOGIC: No focal weakness or paresthesias are detected. SKIN: There are no ulcers or rashes noted. PSYCHIATRIC: The patient has a normal affect.  DATA:    VENOUS DUPLEX: I reviewed the venous duplex scan that was done on 09/22/2019.  On the right side there is no evidence of DVT or superficial venous thrombosis.  There is deep venous reflux involving the femoral vein.  There is superficial venous reflux in the right great saphenous vein which is significantly dilated.  It does narrow down to 0.35 cm in the proximal thigh.  There is also reflux in the small saphenous vein which is not especially dilated.  On the left side there is no evidence of DVT or superficial venous thrombosis.  There is deep venous reflux involving the common femoral vein.  There is superficial venous reflux involving the great saphenous vein which is significantly dilated.  There is also reflux in the small saphenous vein which is significantly dilated.  MEDICAL ISSUES:   CHRONIC VENOUS INSUFFICIENCY: This patient has deep and superficial venous reflux bilaterally.  She has CEAP C4 venous disease.  Her symptoms are consistent with symptoms from venous hypertension.  She has continued with conservative treatment including thigh-high compression stockings elevation, and tramadol as needed for pain.  She is continuing to have significant symptoms and would like to pursue further treatment.  Based on her previous venous duplex scan and my interrogation today I think she would  be a good candidate for laser ablation of the left great saphenous vein and staged laser ablation of the left small saphenous vein.  She could also benefit from sclerotherapy for the telangiectasias on her lateral thigh.  I have discussed the indications for endovenous laser ablation of the left GSV, that is to lower the pressure in the veins and potentially help relieve the symptoms from venous hypertension. I have also discussed alternative options including conservative treatment with leg elevation, compression therapy, exercise, avoiding prolonged sitting and standing, and weight management. I have discussed the potential complications of the procedure, including, but not limited to: bleeding, bruising, leg swelling, nerve injury, skin burns, significant pain from phlebitis, deep venous thrombosis, or failure of the vein to close.  I have also explained that venous insufficiency is a chronic disease, and that the patient is at risk for recurrent varicose veins in the future.  All of the patient's questions were encouraged and answered. They are agreeable to proceed.   I have discussed with the patient the indications for stab phlebectomy.  I have explained to the patient that that will have small scars from the stab incisions.  I explained that the other risks include leg swelling, bruising, bleeding, and phlebitis.  All the patient's questions were encouraged and answered and they are agreeable to proceed.  We will schedule this in the near future.  Deitra Mayo [facility] [phone]

## 2020-01-19 DIAGNOSIS — Z9189 Other specified personal risk factors, not elsewhere classified: Secondary | ICD-10-CM

## 2020-01-19 DIAGNOSIS — Z1371 Encounter for nonprocreative screening for genetic disease carrier status: Secondary | ICD-10-CM

## 2020-01-19 HISTORY — DX: Other specified personal risk factors, not elsewhere classified: Z91.89

## 2020-01-19 HISTORY — DX: Encounter for nonprocreative screening for genetic disease carrier status: Z13.71

## 2020-01-28 ENCOUNTER — Encounter: Payer: Self-pay | Admitting: Obstetrics and Gynecology

## 2020-01-29 ENCOUNTER — Encounter: Payer: Self-pay | Admitting: Vascular Surgery

## 2020-02-03 NOTE — Progress Notes (Signed)
I called to [employer] and they stated that her rental lease was up with them on her CPAP and that the reason the insurance will not pay is because her setting needs to be up more or she needs to use it more often in order for the insurance to pay.  Eloni Darius,cma

## 2020-02-05 NOTE — Progress Notes (Signed)
Noted. Can you let the patient know this? Please find out how many days a week she is using it. If needed would she be willing to go for a repeat sleep study to determine if she needs a high pressure?

## 2020-02-09 ENCOUNTER — Encounter: Payer: Self-pay | Admitting: Obstetrics and Gynecology

## 2020-02-09 ENCOUNTER — Telehealth: Payer: Self-pay | Admitting: Obstetrics and Gynecology

## 2020-02-09 NOTE — Telephone Encounter (Signed)
Pt aware of neg MyRisk testing. IBIS=26.6%. Discussed monthly SBE, yearly CBE and mammos, as well as scr breast MRI. Next mammo due 8/21, so too late for MRI right now. Can do after next mammo if desires.   Patient understands these results only apply to her and her children, and this is not indicative of genetic testing results of her other family members. It is recommended that her other family members have genetic testing done.  Pt also understands negative genetic testing doesn't mean she will never get any of these cancers.   Hard copy mailed to pt. F/u prn.

## 2020-02-11 ENCOUNTER — Encounter: Payer: Self-pay | Admitting: *Deleted

## 2020-02-16 ENCOUNTER — Encounter: Payer: Self-pay | Admitting: Family Medicine

## 2020-02-16 ENCOUNTER — Other Ambulatory Visit: Payer: Self-pay

## 2020-02-16 ENCOUNTER — Ambulatory Visit (INDEPENDENT_AMBULATORY_CARE_PROVIDER_SITE_OTHER): Payer: BC Managed Care – PPO | Admitting: Family Medicine

## 2020-02-16 DIAGNOSIS — Z6839 Body mass index (BMI) 39.0-39.9, adult: Secondary | ICD-10-CM

## 2020-02-16 DIAGNOSIS — G8929 Other chronic pain: Secondary | ICD-10-CM

## 2020-02-16 DIAGNOSIS — G4733 Obstructive sleep apnea (adult) (pediatric): Secondary | ICD-10-CM | POA: Diagnosis not present

## 2020-02-16 DIAGNOSIS — M25511 Pain in right shoulder: Secondary | ICD-10-CM | POA: Insufficient documentation

## 2020-02-16 DIAGNOSIS — E119 Type 2 diabetes mellitus without complications: Secondary | ICD-10-CM | POA: Diagnosis not present

## 2020-02-16 NOTE — Progress Notes (Signed)
Patient came in for appt and this was addressed.  Zakery Normington,cma

## 2020-02-16 NOTE — Assessment & Plan Note (Signed)
Continue current medications.  Given diabetic diet.

## 2020-02-16 NOTE — Patient Instructions (Signed)
Diet Recommendations  Starchy (carb) foods: Bread, rice, pasta, potatoes, corn, cereal, grits, crackers, bagels, muffins, all baked goods.  (Fruits, milk, and yogurt also have carbohydrate, but most of these foods will not spike your blood sugar as the starchy foods will.)  A few fruits do cause high blood sugars; use small portions of bananas (limit to 1/2 at a time), grapes, watermelon, oranges, and most tropical fruits.    Protein foods: Meat, fish, poultry, eggs, dairy foods, and beans such as pinto and kidney beans (beans also provide carbohydrate).   1. Eat at least 3 meals and 1-2 snacks per day. Never go more than 4-5 hours while awake without eating. Eat breakfast within the first hour of getting up.   2. Limit starchy foods to TWO per meal and ONE per snack. ONE portion of a starchy  food is equal to the following:   - ONE slice of bread (or its equivalent, such as half of a hamburger bun).   - 1/2 cup of a "scoopable" starchy food such as potatoes or rice.   - 15 grams of carbohydrate as shown on food label.  3. Include at every meal: a protein food, a carb food, and vegetables and/or fruit.   - Obtain twice the volume of veg's as protein or carbohydrate foods for both lunch and dinner.   - Fresh or frozen veg's are best.   - Keep frozen veg's on hand for a quick vegetable serving.    

## 2020-02-16 NOTE — Progress Notes (Signed)
[name], MD Phone: [phone]  Brittany Santos is a 59 y.o. female who presents today for f/u.  DIABETES Disease Monitoring: Blood Sugar ranges-130-140 Polyuria/phagia/dipsia- no      Optho- UTD Medications: Compliance- taking metformin, jardiance Hypoglycemic symptoms- no  History of HYPERTENSION  Disease Monitoring  Chest pain- no    Dyspnea- no Medications  Compliance-  Not on medications.   OSA: Wearing her CPAP nightly.  She has contacted her insurance company and they are working on figuring out why her CPAP has not been approved.  No hypersomnia.  Wakes well rested.  She sleeps through the night.  Prior compliance report reviewed with adequate compliance and effectiveness.  Obesity: Her diet is okay.  Does eat some junk food type things.  She tries to get on the bike and elliptical and some band exercises.  Right shoulder pain: Notes this has been going on for about 6 months.  The arm feels a little weaker than the left.  Had some soreness in her forearm.  Notes when she raises her right shoulder up she occasionally feels a clicking.  She does take some diclofenac for chronic arthritic discomfort.  Colon cancer screening: She wants to defer this until later this year as she has to pay out-of-pocket for this.  She wants to hold off for her deductible to be fully paid.   Social History   Tobacco Use  Smoking Status Never Smoker  Smokeless Tobacco Never Used     ROS see history of present illness  Objective  Physical Exam Vitals:   02/16/20 0815  BP: 110/70  Pulse: 79  Temp: (!) 96.9 F (36.1 C)  SpO2: 97%    BP Readings from Last 3 Encounters:  02/16/20 110/70  01/15/20 (!) 145/88  01/15/20 134/80   Wt Readings from Last 3 Encounters:  02/16/20 225 lb 9.6 oz (102.3 kg)  01/15/20 220 lb (99.8 kg)  01/15/20 223 lb (101.2 kg)    Physical Exam Constitutional:      General: She is not in acute distress.    Appearance: She is not  diaphoretic.  Cardiovascular:     Rate and Rhythm: Normal rate and regular rhythm.     Heart sounds: Normal heart sounds.  Pulmonary:     Effort: Pulmonary effort is normal.     Breath sounds: Normal breath sounds.  Musculoskeletal:     Right lower leg: No edema.     Left lower leg: No edema.     Comments: Right shoulder nontender, full range of motion, some discomfort on active and passive internal rotation and external rotation as well as abduction, negative empty can bilaterally  Skin:    General: Skin is warm and dry.  Neurological:     Mental Status: She is alert.     Comments: 5/5 strength in bilateral biceps, triceps, grip, quads, hamstrings, plantar and dorsiflexion, sensation to light touch intact in bilateral UE and LE     Diabetic Foot Exam - Simple   Simple Foot Form Diabetic Foot exam was performed with the following findings: Yes 02/16/2020  8:30 AM  Visual Inspection No deformities, no ulcerations, no other skin breakdown bilaterally: Yes Sensation Testing Intact to touch and monofilament testing bilaterally: Yes Pulse Check Posterior Tibialis and Dorsalis pulse intact bilaterally: Yes Comments       Assessment/Plan: Please see individual problem list.  OSA (obstructive sleep apnea) She will continue her CPAP.  She will let us know when she hears from her  insurance company.  This has been beneficial.  Type 2 diabetes mellitus Continue current medications.  Given diabetic diet.  Right shoulder pain Likely arthritis.  Discussed she could continue diclofenac on an as-needed basis.  She could try Tylenol as well.  If not improving would refer to orthopedics.  Obesity Advised on dietary changes.  Discussed increasing exercise.   Health Maintenance: defers colonoscopy until later this year.  Discussed the risk of missing serious issue by deferring colonoscopy.  No orders of the defined types were placed in this encounter.   No orders of the defined types  were placed in this encounter.   This visit occurred during the SARS-CoV-2 public health emergency.  Safety protocols were in place, including screening questions prior to the visit, additional usage of staff PPE, and extensive cleaning of exam room while observing appropriate contact time as indicated for disinfecting solutions.    [name], MD [place]

## 2020-02-16 NOTE — Assessment & Plan Note (Signed)
She will continue her CPAP.  She will let us know when she hears from her insurance company.  This has been beneficial.

## 2020-02-16 NOTE — Assessment & Plan Note (Signed)
Likely arthritis.  Discussed she could continue diclofenac on an as-needed basis.  She could try Tylenol as well.  If not improving would refer to orthopedics.

## 2020-02-19 DIAGNOSIS — E669 Obesity, unspecified: Secondary | ICD-10-CM | POA: Insufficient documentation

## 2020-02-19 NOTE — Assessment & Plan Note (Signed)
Advised on dietary changes.  Discussed increasing exercise.

## 2020-02-23 ENCOUNTER — Other Ambulatory Visit: Payer: Self-pay | Admitting: *Deleted

## 2020-02-23 DIAGNOSIS — I83812 Varicose veins of left lower extremities with pain: Secondary | ICD-10-CM

## 2020-03-08 NOTE — Telephone Encounter (Signed)
Please see

## 2020-03-08 NOTE — Telephone Encounter (Signed)
Pt received [employer] EOB stating MyRisk testing not covered. Reassured pt that it's not a bill and shouldn't receive one from [facility]. Pt states never heard from them re: cost estimator. F/u if she received bill.

## 2020-03-08 NOTE — Telephone Encounter (Signed)
Pt would like call about testing. She works from home so may not be able to answer the first time, she asks that you call again.

## 2020-03-17 ENCOUNTER — Encounter: Payer: Self-pay | Admitting: Family Medicine

## 2020-03-18 ENCOUNTER — Telehealth: Payer: Self-pay | Admitting: Family Medicine

## 2020-03-18 NOTE — Telephone Encounter (Signed)
Please call Evelena Peat from [employer] about appeal on a prior authorization on patient's CPAP supplies. His phone number is [phone], EXT [phone]

## 2020-03-22 ENCOUNTER — Telehealth: Payer: Self-pay | Admitting: Family Medicine

## 2020-03-22 NOTE — Telephone Encounter (Signed)
Pt wants to know if she can get her second covid vaccine. She is due but was just in the hospital with pneumonia. Please call pt back

## 2020-03-24 NOTE — Telephone Encounter (Signed)
See note below

## 2020-03-24 NOTE — Telephone Encounter (Signed)
I called the number for [employer] for this patient [phone] ext [phone] and the operator stated the number was not in service.  Brittany Santos,cma

## 2020-03-24 NOTE — Telephone Encounter (Signed)
Where was she hospitalized? I would need to review the details of her admission to provide accurate advice.

## 2020-03-24 NOTE — Telephone Encounter (Signed)
Pt wants to know if she can get her second covid vaccine. She is due but was just in the hospital with pneumonia.  Vinicius Brockman,cma

## 2020-03-25 NOTE — Telephone Encounter (Signed)
It was her mom that was in the hospital  at [facility] with pneumonia not the patient and I informed her to wait at least 6 weeks before getting the second dose after pneumonia.  Zarius Furr,cma

## 2020-03-25 NOTE — Telephone Encounter (Signed)
I called the patient's daughter and I informed her to discuss the second covid vaccine she statd her mom has an appointment with her doctor tomorrow and they will discuss then.  Katalia Choma,cma

## 2020-03-25 NOTE — Telephone Encounter (Signed)
Is the patient asking a question about when she is able to get the COVID19 vaccine or when her mother is able to get the COVID19 vaccine? If it is a question about her mom they need to contact her mothers doctor to determine this.

## 2020-04-05 ENCOUNTER — Ambulatory Visit: Payer: BC Managed Care – PPO | Admitting: Family

## 2020-04-05 DIAGNOSIS — Z20822 Contact with and (suspected) exposure to covid-19: Secondary | ICD-10-CM | POA: Diagnosis not present

## 2020-04-05 DIAGNOSIS — Z03818 Encounter for observation for suspected exposure to other biological agents ruled out: Secondary | ICD-10-CM | POA: Diagnosis not present

## 2020-04-06 ENCOUNTER — Telehealth: Payer: Self-pay | Admitting: Emergency Medicine

## 2020-04-06 ENCOUNTER — Ambulatory Visit
Admission: EM | Admit: 2020-04-06 | Discharge: 2020-04-06 | Disposition: A | Discharge status: Home / Self Care | Payer: BC Managed Care – PPO | Attending: Family Medicine | Admitting: Family Medicine

## 2020-04-06 ENCOUNTER — Other Ambulatory Visit: Payer: Self-pay

## 2020-04-06 ENCOUNTER — Telehealth: Payer: Self-pay

## 2020-04-06 ENCOUNTER — Encounter: Payer: Self-pay | Admitting: Emergency Medicine

## 2020-04-06 DIAGNOSIS — R0982 Postnasal drip: Secondary | ICD-10-CM | POA: Diagnosis not present

## 2020-04-06 DIAGNOSIS — K219 Gastro-esophageal reflux disease without esophagitis: Secondary | ICD-10-CM | POA: Insufficient documentation

## 2020-04-06 DIAGNOSIS — J019 Acute sinusitis, unspecified: Secondary | ICD-10-CM | POA: Diagnosis not present

## 2020-04-06 MED ORDER — DEXLANSOPRAZOLE 60 MG PO CPDR: 60.0000 mg | DELAYED_RELEASE_CAPSULE | Freq: Every day | ORAL | 1 refills | Status: DC

## 2020-04-06 MED ORDER — ALUM & MAG HYDROXIDE-SIMETH 200-200-20 MG/5ML PO SUSP
30.0000 mL | Freq: Once | ORAL | Status: AC
  Administered 2020-04-06: 30 mL via ORAL

## 2020-04-06 NOTE — Telephone Encounter (Signed)
Called [employer] ro order CPAP supplies for this patient.  Brittany Santos,cma

## 2020-04-06 NOTE — Telephone Encounter (Signed)
Patient called requesting PA for Dexilant prescription. Script for Pepcid 20mg  sent in to [place]. Advised to take twice daily along with Protonix twice daily. Patient verbalized understanding.

## 2020-04-06 NOTE — Discharge Instructions (Signed)
Stop protonix.   Start Dexilant. If it is too expensive please let me know.  Take care  Dr. Lacinda Axon
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
## 2020-04-06 NOTE — ED Triage Notes (Signed)
Patient c/o reflux that started 1 week ago. Patient states she takes Protonix twice daily. She has tried Maalox, Gaviscon with no relief.

## 2020-04-12 NOTE — Telephone Encounter (Signed)
Denyse Amass From [place] Ext [phone]  He said he has been speaking with you about this patient and need to know if there are any updates? He would like a call back.

## 2020-04-13 ENCOUNTER — Other Ambulatory Visit: Payer: Self-pay

## 2020-04-13 ENCOUNTER — Encounter: Payer: Self-pay | Admitting: Gastroenterology

## 2020-04-13 ENCOUNTER — Telehealth (INDEPENDENT_AMBULATORY_CARE_PROVIDER_SITE_OTHER): Payer: BC Managed Care – PPO | Admitting: Gastroenterology

## 2020-04-13 DIAGNOSIS — K219 Gastro-esophageal reflux disease without esophagitis: Secondary | ICD-10-CM | POA: Diagnosis not present

## 2020-04-13 MED ORDER — SUCRALFATE 1 GM/10ML PO SUSP: 1.0000 g | Freq: Four times a day (QID) | ORAL | 3 refills | Status: DC

## 2020-04-13 NOTE — Progress Notes (Signed)
[name], MD [address]  [place], [place] [address]  Main: [phone]  Fax: [phone]    Gastroenterology Virtual/Video Visit  Referring Provider:     Amalia Greenhouse Primary Care Physician:  Leone Haven, MD Primary Gastroenterologist:  Dr.Jillianna Stanek Allen Norris Reason for Consultation:     GERD        HPI:    Virtual Visit via Video Note Location of the patient: Home Location of provider: Office  Participating persons: The patient myself and the patient.  I connected with Brittany Santos on 04/13/20 at  2:30 PM EDT by a video enabled telemedicine application and verified that I am speaking with the correct person using two identifiers.   I discussed the limitations of evaluation and management by telemedicine and the availability of in person appointments. The patient expressed understanding and agreed to proceed.  Verbal consent to proceed obtained.  History of Present Illness: Brittany Santos is a 59 y.o. female referred by Dr. Caryl Bis, Angela Adam, MD  for consultation & management of GERD.  The patient states that she was in the emergency department because she was having significant amounts of discomfort and had been on Protonix twice a day.  The patient states she was at a Nexium once a day to her Protonix twice a day.  She also states that she has to chew her food very well since having surgery on her cervical vertebrae and was told by the orthopedic surgeon that she may have trouble with bulky foods.  The patient states that she is due for a colonoscopy next year.  She also reports that she has been doing well on the Protonix twice a day and the Nexium once a day but had done well on a liquid she had been given in the past by her previous gastroenterologist.  She does not recall what the liquid was.  There is no report of any unexplained weight loss fevers chills black stools or bloody stools.  The patient was seen in urgent care and they  sent me a message to have the patient seen earlier since the next available appointment was too far away for her acute exacerbation of symptoms.  Past Medical History:  Diagnosis Date  . Allergy   . Arthritis   . BRCA negative 01/2020   MyRisk neg  . Diabetes mellitus without complication ([place])   . Family history of breast cancer   . GERD (gastroesophageal reflux disease)   . History of colon polyps 2016, 2017   at [facility]  . History of diverticulitis   . History of kidney stones 04/15/15  . Increased risk of breast cancer 01/2020   IBIS=26.6%    Past Surgical History:  Procedure Laterality Date  . BILATERAL CARPAL TUNNEL RELEASE    . BREAST SURGERY    . cervical neck fusion    . CHOLECYSTECTOMY    . COLONOSCOPY  2017   Dr. Gustavo Lah with polyp; repeat due 2021  . ESOPHAGOGASTRODUODENOSCOPY (EGD) WITH PROPOFOL N/A 05/12/2016   Procedure: ESOPHAGOGASTRODUODENOSCOPY (EGD) WITH PROPOFOL;  Surgeon: Lollie Sails, MD;  Location: [facility] ENDOSCOPY;  Service: Endoscopy;  Laterality: N/A;    Prior to Admission medications   Medication Sig Start Date End Date Taking? Authorizing Provider  BIOTIN PO Take 1 tablet by mouth daily.   Yes [provider]  Calcium-Magnesium-Vitamin D (CALCIUM 500 PO) Take by mouth.   Yes [provider]  cetirizine (ZYRTEC) 10 MG tablet  Take 10 mg by mouth daily.   Yes [provider]  dexlansoprazole (DEXILANT) 60 MG capsule Take 1 capsule (60 mg total) by mouth daily. 04/06/20  Yes Coral Spikes, DO  diclofenac (VOLTAREN) 75 MG EC tablet  11/13/16  Yes [provider]  glucose blood test strip Dispense based on patient preference and insurance preference, Use as once daily Dx: E11.9 01/09/20  Yes Leone Haven, MD  JARDIANCE 10 MG TABS tablet TAKE 1 TABLET BY MOUTH DAILY 12/30/19  Yes Leone Haven, MD  Lancet Devices (ONE TOUCH DELICA LANCING DEV) MISC Use as directed Dx: E11.9 09/29/15  Yes Doss, Velora Heckler, RN  magic  mouthwash SOLN Take 10 mLs by mouth 4 (four) times daily as needed (throat pain). 08/30/19  Yes Amyot, Nicholes Stairs, NP  metFORMIN (GLUCOPHAGE-XR) 500 MG 24 hr tablet TAKE 2 TABLETS (=1,'000MG'$    TOTAL) 2 TIMES DAILY WITH  MEALS 12/02/19  Yes Leone Haven, MD  Multiple Vitamin (MULTIVITAMIN) capsule Take 1 capsule by mouth daily.   Yes [provider]  progesterone (PROMETRIUM) 100 MG capsule Take 1 capsule (100 mg total) by mouth daily. 7/82/95  Yes Copland, Elmo Putt B, PA-C  traMADol (ULTRAM) 50 MG tablet Take 50 mg by mouth every 6 (six) hours as needed.   Yes [provider]  vitamin B-12 (CYANOCOBALAMIN) 100 MCG tablet Take 100 mcg by mouth daily.   Yes [provider]  sucralfate (CARAFATE) 1 GM/10ML suspension Take 10 mLs (1 g total) by mouth 4 (four) times daily. 04/13/20   [name], MD  pantoprazole (PROTONIX) 40 MG tablet Take 1 tablet (40 mg total) by mouth 2 (two) times daily. **PLEASE SCHEDULE FOLLOW UP APPT** 12/30/19 04/06/20  [name], MD    Family History  Problem Relation Age of Onset  . Hypertension Brother   . Breast cancer Mother        twice--30s and 43 (bilat breasts)  . Cancer Sister 68       Blood cancer (unknown)  . Cancer Maternal Aunt        Lung  . Kidney cancer Maternal Uncle 12  . Heart attack Paternal Uncle   . Aneurysm Paternal Grandmother   . Prostate cancer Maternal Uncle 79       needed seed tx  . Prostate cancer Cousin 62  . Diabetes Neg Hx      Social History   Tobacco Use  . Smoking status: Never Smoker  . Smokeless tobacco: Never Used  Substance Use Topics  . Alcohol use: No    Alcohol/week: 0.0 standard drinks  . Drug use: No    Allergies as of 04/13/2020 - Review Complete 04/06/2020  Allergen Reaction Noted  . Clarithromycin  03/14/2019  . Vicodin [hydrocodone-acetaminophen] Itching and Rash 01/20/2015    Review of Systems:    All systems reviewed and negative except where noted in  HPI.   Observations/Objective:  Labs: CBC    Component Value Date/Time   WBC 8.3 04/26/2018 0832   RBC 4.24 04/26/2018 0832   HGB 12.9 04/26/2018 0832   HCT 38.3 04/26/2018 0832   PLT 251.0 04/26/2018 0832   MCV 90.5 04/26/2018 0832   MCH 28.9 10/21/2015 1724   MCHC 33.6 04/26/2018 0832   RDW 15.0 04/26/2018 0832   LYMPHSABS 1.8 06/07/2009 1620   MONOABS 0.6 06/07/2009 1620   EOSABS 0.2 06/07/2009 1620   BASOSABS 0.0 06/07/2009 1620   CMP     Component Value Date/Time  NA 140 12/18/2019 0907   NA 142 10/09/2014 0000   K 4.1 12/18/2019 0907   CL 102 12/18/2019 0907   CO2 32 12/18/2019 0907   GLUCOSE 132 (H) 12/18/2019 0907   BUN 17 12/18/2019 0907   BUN 11 10/09/2014 0000   CREATININE 0.86 12/18/2019 0907   CALCIUM 9.6 12/18/2019 0907   PROT 6.8 12/18/2019 0907   ALBUMIN 4.3 12/18/2019 0907   AST 16 12/18/2019 0907   ALT 15 12/18/2019 0907   ALKPHOS 66 12/18/2019 0907   BILITOT 0.5 12/18/2019 0907   GFRNONAA >60 10/21/2015 1724   GFRAA >60 10/21/2015 1724    Imaging Studies: No results found.  Assessment and Plan:   Brittany Santos is a 59 y.o. y/o female has been referred for worsening GERD symptoms.  The patient has been told that she should stop the pantoprazole since when she was taking it twice a day it was not relieving her symptoms.  The patient is already on Nexium and she does not need to be on 2 different PPIs.  The patient has also been told that the only liquid that may have been prescribed for her in the past that she seems to have had resolution from may have been Carafate.  The patient will be started on liquid Carafate 4 times a day while continuing the Nexium.  The patient has also been told that if her symptoms get worse on the Nexium and Carafate after stopping the pantoprazole that she should take the Nexium twice a day.  The patient has been explained the plan and agrees with it.  Follow Up Instructions:  I discussed the assessment and  treatment plan with the patient. The patient was provided an opportunity to ask questions and all were answered. The patient agreed with the plan and demonstrated an understanding of the instructions.   The patient was advised to call back or seek an in-person evaluation if the symptoms worsen or if the condition fails to improve as anticipated.  I provided 20 minutes of non-face-to-face time during this encounter.   [name], MD  Speech recognition software was used to dictate the above note.

## 2020-04-14 ENCOUNTER — Telehealth (HOSPITAL_COMMUNITY): Payer: Self-pay

## 2020-04-14 NOTE — Telephone Encounter (Signed)

## 2020-04-15 ENCOUNTER — Other Ambulatory Visit: Payer: Self-pay

## 2020-04-15 ENCOUNTER — Encounter: Payer: Self-pay | Admitting: Vascular Surgery

## 2020-04-15 ENCOUNTER — Ambulatory Visit (INDEPENDENT_AMBULATORY_CARE_PROVIDER_SITE_OTHER): Payer: BC Managed Care – PPO | Admitting: Vascular Surgery

## 2020-04-15 VITALS — BP 144/86 | HR 84 | Temp 97.6°F | Resp 16 | Ht 64.0 in | Wt 220.0 lb

## 2020-04-15 DIAGNOSIS — I83812 Varicose veins of left lower extremities with pain: Secondary | ICD-10-CM | POA: Diagnosis not present

## 2020-04-15 HISTORY — PX: ENDOVENOUS ABLATION SAPHENOUS VEIN W/ LASER: SUR449

## 2020-04-15 NOTE — Progress Notes (Signed)
     Laser Ablation Procedure    Date: 04/15/2020   Brittany Santos [id number] DOB:10-10-1961  Consent signed: Yes    Surgeon: Gae Gallop MD   Procedure: Laser Ablation: left Greater Saphenous Vein  BP (!) 144/86 (BP Location: Right Arm, Patient Position: Sitting, Cuff Size: Normal)   Pulse 84   Temp 97.6 F (36.4 C) (Temporal)   Resp 16   Ht 5\' 4"  (1.626 m)   Wt 220 lb (99.8 kg)   LMP  (LMP Unknown) Comment: Patient states she had her "uterus scraped" sometime in the past 10 years.   SpO2 99%   BMI 37.76 kg/m   Tumescent Anesthesia: 500 cc 0.9% NaCl with 50 cc Lidocaine HCL 1%  and 15 cc 8.4% NaHCO3  Local Anesthesia: 8 cc Lidocaine HCL and NaHCO3 (ratio 2:1)  7 watts continuous mode       Total energy: 1812 Joules     Total Time:  258 seconds Treatment length  42 cm  Laser Fiber Ref. # [id number]    Lot # [id number]     Patient tolerated procedure well  Notes: Patient wore face mask.  All staff members wore facial masks and facial shields/goggles.    Description of Procedure:  After marking the course of the secondary varicosities, the patient was placed on the operating table in the supine position, and the left leg was prepped and draped in sterile fashion.   Local anesthetic was administered and under ultrasound guidance the saphenous vein was accessed with a micro needle and guide wire; then the mirco puncture sheath was placed.  A guide wire was inserted saphenofemoral junction , followed by a 5 french sheath.  The position of the sheath and then the laser fiber below the junction was confirmed using the ultrasound.  Tumescent anesthesia was administered along the course of the saphenous vein using ultrasound guidance. The patient was placed in Trendelenburg position and protective laser glasses were placed on patient and staff, and the laser was fired at 7 watts continuous mode  for a total of 1812 joules.       Steri strip was applied to the IV insertion site and ABD  pads and thigh high compression stockings were applied.  Ace wrap bandages were applied over the left thigh and at the top of the saphenofemoral junction. Blood loss was less than 15 cc.  Discharge instructions reviewed with patient and hardcopy of discharge instructions given to patient to take home. The patient ambulated out of the operating room having tolerated the procedure well.

## 2020-04-15 NOTE — Progress Notes (Signed)
Patient name: Brittany Santos [id number] MRN: [id number] DOB: Jan 11, 1961 Sex: female  REASON FOR VISIT: For endovenous laser ablation of the left great saphenous vein.  HPI: Brittany Santos is a 59 y.o. female who had been seen previously by the nurse practitioner with leg swelling.  She had leg swelling for many years and significant aching pain in her legs.  She failed conservative treatment including thigh-high compression stockings leg elevation and ibuprofen as needed for pain.  She had some hyperpigmentation bilaterally.  I looked at both great saphenous veins myself with the SonoSite.  She had reflux in the left great saphenous vein down to the knee and the vein was dilated.  She also had reflux on the right with a dilated vein although it did narrow some in the mid thigh.  She also had reflux in the left small saphenous vein which was dilated.  I felt that she would be a candidate for laser ablation of the left great saphenous vein and then staged laser ablation of the left small saphenous vein.  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  Current Outpatient Medications  Medication Sig Dispense Refill  . BIOTIN PO Take 1 tablet by mouth daily.    . Calcium-Magnesium-Vitamin D (CALCIUM 500 PO) Take by mouth.    . cetirizine (ZYRTEC) 10 MG tablet Take 10 mg by mouth daily.    Marland Kitchen dexlansoprazole (DEXILANT) 60 MG capsule Take 1 capsule (60 mg total) by mouth daily. 30 capsule 1  . diclofenac (VOLTAREN) 75 MG EC tablet     . glucose blood test strip Dispense based on patient preference and insurance preference, Use as once daily Dx: E11.9 100 each 12  . JARDIANCE 10 MG TABS tablet TAKE 1 TABLET BY MOUTH DAILY 90 tablet 1  . Lancet Devices (ONE TOUCH DELICA LANCING DEV) MISC Use as directed Dx: E11.9 1 each 0  . magic mouthwash SOLN Take 10 mLs by mouth 4 (four) times daily as needed (throat pain). 120 mL 0  . metFORMIN (GLUCOPHAGE-XR) 500 MG 24 hr  tablet TAKE 2 TABLETS (=1,000MG    TOTAL) 2 TIMES DAILY WITH  MEALS 360 tablet 1  . Multiple Vitamin (MULTIVITAMIN) capsule Take 1 capsule by mouth daily.    . progesterone (PROMETRIUM) 100 MG capsule Take 1 capsule (100 mg total) by mouth daily. 90 capsule 3  . traMADol (ULTRAM) 50 MG tablet Take 50 mg by mouth every 6 (six) hours as needed.    . vitamin B-12 (CYANOCOBALAMIN) 100 MCG tablet Take 100 mcg by mouth daily.    . sucralfate (CARAFATE) 1 GM/10ML suspension Take 10 mLs (1 g total) by mouth 4 (four) times daily. (Patient not taking: Reported on 04/15/2020) 420 mL 3   No current facility-administered medications for this visit.    PHYSICAL EXAM: Vitals:   04/15/20 1052  BP: (!) 144/86  Pulse: 84  Resp: 16  Temp: 97.6 F (36.4 C)  TempSrc: Temporal  SpO2: 99%  Weight: 220 lb (99.8 kg)  Height: 5\' 4"  (1.626 m)    PROCEDURE: Endovenous laser ablation of the left great saphenous vein  TECHNIQUE: The patient was taken to the exam room and I looked at the great saphenous vein line with the SonoSite.  Was going to try to cannulate it above the knee.  The left leg was prepped and draped in usual sterile fashion.  Under ultrasound guidance, after the skin was anesthetized, attempted to cannulate the great saphenous  vein above the knee but because of the depth of the vein was having a difficult time visualizing the wire.  I did not want to take any risk with perhaps the vein entering the deep system so therefore I remove the vein and sheath and ultimately cannulated below the knee where the vein was more superficial.  This vein was quite deep.  Under ultrasound guidance, after the skin was anesthetized, I cannulated the great saphenous vein below the knee and the micropuncture sheath was introduced over the wire.  I then advanced the J-wire to just below the saphenofemoral junction.  Dilator and sheath were then advanced over the wire and the wire and dilator removed.  The laser fiber was  positioned at the end of the sheath and then the sheath retracted.  I positioned the laser fiber approximately 2 and half centimeters distal to the saphenofemoral junction distal to the superficial epigastric vein.  Next tumescent anesthesia was administered circumferentially around the entire circumference of the vein.  Laser ablation was performed from 2-1/2 cm distal to the saphenofemoral junction to below the knee.  40 cm of vein were treated.  1812 J of energy were used.  Sterile dressing was applied.  Patient tolerated the procedure well.  She will return next week for follow-up duplex.  Deitra Mayo [facility] [place] [phone]

## 2020-04-20 ENCOUNTER — Telehealth: Payer: Self-pay

## 2020-04-20 NOTE — Telephone Encounter (Signed)
I called [employer] and they are faxing me a compliance report prior to 06/25/2019 to the office I can then call to [employer] at [phone] to see if they would do an appeal for precerification for the date of service 06/25/2019 for  The patient CPAP supplies, if they will not I can call [employer] at [phone] and see if they will precertify the patient supplies using the compliance report.  Then I am to call Evelena Peat of [employer] at [phone].  Timathy Newberry,cma

## 2020-04-22 ENCOUNTER — Ambulatory Visit (INDEPENDENT_AMBULATORY_CARE_PROVIDER_SITE_OTHER): Payer: BC Managed Care – PPO | Admitting: Vascular Surgery

## 2020-04-22 ENCOUNTER — Other Ambulatory Visit: Payer: Self-pay

## 2020-04-22 ENCOUNTER — Encounter: Payer: Self-pay | Admitting: Vascular Surgery

## 2020-04-22 ENCOUNTER — Ambulatory Visit (HOSPITAL_COMMUNITY)
Admission: RE | Admit: 2020-04-22 | Discharge: 2020-04-22 | Disposition: A | Discharge status: Home / Self Care | Payer: BC Managed Care – PPO | Source: Ambulatory Visit | Attending: Vascular Surgery | Admitting: Vascular Surgery

## 2020-04-22 VITALS — BP 124/73 | HR 73 | Temp 97.3°F | Resp 16 | Ht 64.0 in | Wt 220.0 lb

## 2020-04-22 DIAGNOSIS — I83812 Varicose veins of left lower extremities with pain: Secondary | ICD-10-CM | POA: Diagnosis not present

## 2020-04-22 DIAGNOSIS — I872 Venous insufficiency (chronic) (peripheral): Secondary | ICD-10-CM

## 2020-04-22 NOTE — Progress Notes (Signed)
Patient name: Brittany Santos [id number] MRN: [id number] DOB: 11/28/1961 Sex: female  REASON FOR VISIT: Follow-up after laser ablation of the left great saphenous vein.  HPI: Brittany Santos is a 59 y.o. female who had been seen previously with leg swelling and venous insufficiency.  She failed conservative treatment and was felt to be a good candidate for laser ablation of the left great saphenous vein.  She had hyperpigmentation bilaterally consistent with CEAP C4a venous disease.  On 04/15/2020 she underwent laser ablation of the left great saphenous vein from 2-1/2 cm distal to the saphenofemoral junction to just below the knee.  She comes in for a 1 week follow-up visit.  Of note we planned staged laser ablation of the small saphenous vein on the left.  It was felt that after her reflux was addressed she would be a candidate for sclerotherapy of the telangiectasias of her lateral thigh.  She states that her leg does feel somewhat better after the laser ablation although I think it is too early to say.  She has been wearing her thigh-high stockings.  She has had mild leg swelling and mild bruising.  She denies any chest pain or shortness of breath.  Current Outpatient Medications  Medication Sig Dispense Refill  . BIOTIN PO Take 1 tablet by mouth daily.    . Calcium-Magnesium-Vitamin D (CALCIUM 500 PO) Take by mouth.    . cetirizine (ZYRTEC) 10 MG tablet Take 10 mg by mouth daily.    Marland Kitchen dexlansoprazole (DEXILANT) 60 MG capsule Take 1 capsule (60 mg total) by mouth daily. 30 capsule 1  . diclofenac (VOLTAREN) 75 MG EC tablet     . glucose blood test strip Dispense based on patient preference and insurance preference, Use as once daily Dx: E11.9 100 each 12  . JARDIANCE 10 MG TABS tablet TAKE 1 TABLET BY MOUTH DAILY 90 tablet 1  . Lancet Devices (ONE TOUCH DELICA LANCING DEV) MISC Use as directed Dx: E11.9 1 each 0  . magic mouthwash SOLN Take 10 mLs by mouth 4 (four) times daily as needed  (throat pain). 120 mL 0  . metFORMIN (GLUCOPHAGE-XR) 500 MG 24 hr tablet TAKE 2 TABLETS (=1,000MG    TOTAL) 2 TIMES DAILY WITH  MEALS 360 tablet 1  . Multiple Vitamin (MULTIVITAMIN) capsule Take 1 capsule by mouth daily.    . progesterone (PROMETRIUM) 100 MG capsule Take 1 capsule (100 mg total) by mouth daily. 90 capsule 3  . sucralfate (CARAFATE) 1 GM/10ML suspension Take 10 mLs (1 g total) by mouth 4 (four) times daily. (Patient not taking: Reported on 04/15/2020) 420 mL 3  . traMADol (ULTRAM) 50 MG tablet Take 50 mg by mouth every 6 (six) hours as needed.    . vitamin B-12 (CYANOCOBALAMIN) 100 MCG tablet Take 100 mcg by mouth daily.     No current facility-administered medications for this visit.   REVIEW OF SYSTEMS: Valu.Nieves ] denotes positive finding; [  ] denotes negative finding  CARDIOVASCULAR:  [ ]  chest pain   [ ]  dyspnea on exertion  [ ]  leg swelling  CONSTITUTIONAL:  [ ]  fever   [ ]  chills  PHYSICAL EXAM: Vitals:   04/22/20 1048  BP: 124/73  Pulse: 73  Resp: 16  Temp: (!) 97.3 F (36.3 C)  TempSrc: Temporal  SpO2: 99%  Weight: 220 lb (99.8 kg)  Height: 5\' 4"  (1.626 m)   GENERAL: The patient is a well-nourished female, in no acute distress. The vital signs are  documented above. CARDIOVASCULAR: There is a regular rate and rhythm. PULMONARY: There is good air exchange bilaterally without wheezing or rales. VASCULAR: She has minimal bruising in her medial right thigh.  She has mild bilateral lower extremity swelling.  DATA:  VENOUS DUPLEX: I have independently interpreted her venous duplex scan today.  There is no evidence of DVT.  The great saphenous vein is closed from 1.87 cm distal to the saphenofemoral junction to the proximal calf.  MEDICAL ISSUES:  S/P ENDOVENOUS LASER ABLATION LEFT GREAT SAPHENOUS VEIN: Patient is doing well status post laser ablation of the left great saphenous vein.  She will continue with her thigh-high stocking for 1 more week.  After that it may be  more practical to wear a knee-high stocking with a milder gradient.  She will reevaluate her symptoms and decide if she would like to proceed with laser ablation of the left small saphenous vein.  I plan on seeing her back in September unless she call sooner.  We have again discussed conservative measures that she should continue including leg elevation, compression therapy, exercise, and avoiding prolonged sitting and standing.  Deitra Mayo [facility] [place] [phone]

## 2020-04-23 NOTE — Telephone Encounter (Signed)
I called and LVM for Brittany Santos of [employer] informing him that I faxed over the compliance report to [employer] to rrun it through to pay for service for 06/24/2019. Awaiting a response.  Jaliah Foody,cma

## 2020-04-23 NOTE — Telephone Encounter (Addendum)
[employer] # [phone] returned your call regarding a prior authorization for CPAP supplies for the patient. He stated your are to get in touch with [employer] [place] [phone]. [employer] no longer does the prior authorizations for CPAP supplies

## 2020-04-26 DIAGNOSIS — G4733 Obstructive sleep apnea (adult) (pediatric): Secondary | ICD-10-CM | POA: Diagnosis not present

## 2020-04-26 NOTE — Telephone Encounter (Signed)
I called and left a message for Brittany Santos informing him that I did resubmitted the notes and compliance report to [employer] for the patient.  Malick Netz,cma

## 2020-04-29 NOTE — Telephone Encounter (Signed)
I spoke with Brittany Santos from [place]. He needed number to [employer] to make sure they file appeal for patient. He stated that [name] had done everything he thought that was needed on our end. They are trying to get her claim from 06/2019 CPAP supplies covered by insurance before appeal time window runs out.

## 2020-05-04 ENCOUNTER — Telehealth: Payer: Self-pay | Admitting: Family Medicine

## 2020-05-04 ENCOUNTER — Other Ambulatory Visit: Payer: Self-pay

## 2020-05-04 MED ORDER — SUCRALFATE 1 G PO TABS: 1.0000 g | ORAL_TABLET | Freq: Four times a day (QID) | ORAL | 2 refills | Status: DC

## 2020-05-04 NOTE — Telephone Encounter (Signed)
Brittany Santos from [facility] called [phone]- ext [phone]

## 2020-05-05 NOTE — Telephone Encounter (Signed)
Evelena Peat called again. Patient had a CPAP order, he needs the CPAP compliance report. Email to patient at [email or web address]. Please call Evelena Peat at number below, time is running out.

## 2020-05-05 NOTE — Telephone Encounter (Signed)
I received a call back from [place] and I sent the email of the compliance report to the patient at his request.  Brittany Santos,cma

## 2020-05-05 NOTE — Telephone Encounter (Signed)
Lvm FOR JESSIE OF [employer] TO CALL BACK. Maryah Marinaro,CMA

## 2020-05-10 ENCOUNTER — Other Ambulatory Visit: Payer: Self-pay

## 2020-05-10 MED ORDER — FAMOTIDINE 20 MG PO TABS: 20.0000 mg | ORAL_TABLET | Freq: Two times a day (BID) | ORAL | 6 refills | Status: DC

## 2020-06-02 ENCOUNTER — Other Ambulatory Visit: Payer: Self-pay | Admitting: Family Medicine

## 2020-06-22 ENCOUNTER — Ambulatory Visit (INDEPENDENT_AMBULATORY_CARE_PROVIDER_SITE_OTHER): Payer: BC Managed Care – PPO | Admitting: Family Medicine

## 2020-06-22 ENCOUNTER — Encounter: Payer: Self-pay | Admitting: Family Medicine

## 2020-06-22 ENCOUNTER — Other Ambulatory Visit: Payer: Self-pay

## 2020-06-22 DIAGNOSIS — E119 Type 2 diabetes mellitus without complications: Secondary | ICD-10-CM | POA: Diagnosis not present

## 2020-06-22 DIAGNOSIS — K219 Gastro-esophageal reflux disease without esophagitis: Secondary | ICD-10-CM | POA: Diagnosis not present

## 2020-06-22 DIAGNOSIS — Z566 Other physical and mental strain related to work: Secondary | ICD-10-CM | POA: Insufficient documentation

## 2020-06-22 NOTE — Assessment & Plan Note (Addendum)
Chronic ongoing issue with mild depression associated with this. Discussed trying to do things for herself to help her feel better. Discussed potential medications for depression though she declines these at this time. She will monitor.

## 2020-06-22 NOTE — Patient Instructions (Signed)
Nice to see you. Please monitor your stress at work.  If you start to feel depressed and would like medication or therapy please let us know. Please continue your current diabetes medications.

## 2020-06-22 NOTE — Assessment & Plan Note (Signed)
Continue current meds. A1c is well controlled.

## 2020-06-22 NOTE — Progress Notes (Signed)
[name], MD Phone: [phone]  Brittany Santos is a 59 y.o. female who presents today for f/u.  DIABETES Disease Monitoring: Blood Sugar ranges-90 day average of 130 Polyuria/phagia/dipsia- some increased thirst    Optho- UTD Medications: Compliance- taking jardiance, metformin Hypoglycemic symptoms- no  GERD: Patient is now on Dexilant and Carafate. Has had some dysphagia though it is improved from previously.  Has been seeing GI for this and they are considering an EGD.  She is also due for colonoscopy.  Stress: Quite a bit of stress at work.  They have them work overtime 3 extra hours a day during the week.  They would like them to work on the weekends.  They have increased the numbers that they have to hit each day.  When she is not at work she does not want to talk about work.  She does note a light is at the end of the tunnel as she has 1 year and 9 months until she can retire. She does not have enough time to do things for herself. She does have a friend she can talk to about all of this.  No SI.  Depression screen [facility] 2/9 06/22/2020 06/22/2020 01/09/2020 08/27/2019 05/15/2019  Decreased Interest 1 0 0 0 0  Down, Depressed, Hopeless 1 0 0 0 0  PHQ - 2 Score 2 0 0 0 0  Altered sleeping 0 - - - 0  Tired, decreased energy 2 - - - 0  Change in appetite 1 - - - 0  Feeling bad or failure about yourself  0 - - - 0  Trouble concentrating 1 - - - 0  Moving slowly or fidgety/restless 1 - - - 0  Suicidal thoughts 0 - - - 0  PHQ-9 Score 7 - - - 0  Difficult doing work/chores Somewhat difficult - - - Not difficult at all      Social History   Tobacco Use  Smoking Status Never Smoker  Smokeless Tobacco Never Used     ROS see history of present illness  Objective  Physical Exam Vitals:   06/22/20 0828  BP: 120/80  Pulse: 73  Temp: (!) 97.4 F (36.3 C)  SpO2: 98%    BP Readings from Last 3 Encounters:  06/22/20 120/80  04/22/20 124/73  04/15/20 (!) 144/86    Wt Readings from Last 3 Encounters:  06/22/20 223 lb 6.4 oz (101.3 kg)  04/22/20 220 lb (99.8 kg)  04/15/20 220 lb (99.8 kg)    Physical Exam Constitutional:      General: She is not in acute distress.    Appearance: She is not diaphoretic.  Cardiovascular:     Rate and Rhythm: Normal rate and regular rhythm.     Heart sounds: Normal heart sounds.  Pulmonary:     Effort: Pulmonary effort is normal.     Breath sounds: Normal breath sounds.  Skin:    General: Skin is warm and dry.  Neurological:     Mental Status: She is alert.      Assessment/Plan: Please see individual problem list.  GERD (gastroesophageal reflux disease) She will continue her current regimen. Encouraged her to follow-up with GI if symptoms not resolving on her current regimen.   Stress at work Chronic ongoing issue. Discussed trying to do things for herself to help her feel better. Discussed potential medications for depression though she declines these at this time. She will monitor.   Type 2 diabetes mellitus Continue current meds. A1c is  well controlled.   Health maintenance: Encouraged colonoscopy through GI.  No orders of the defined types were placed in this encounter.   No orders of the defined types were placed in this encounter.   This visit occurred during the SARS-CoV-2 public health emergency.  Safety protocols were in place, including screening questions prior to the visit, additional usage of staff PPE, and extensive cleaning of exam room while observing appropriate contact time as indicated for disinfecting solutions.    [name], MD [place]

## 2020-06-22 NOTE — Assessment & Plan Note (Signed)
She will continue her current regimen. Encouraged her to follow-up with GI if symptoms not resolving on her current regimen.

## 2020-06-25 ENCOUNTER — Telehealth: Payer: Self-pay | Admitting: Obstetrics and Gynecology

## 2020-06-25 NOTE — Telephone Encounter (Signed)
Pt's insurance didn't cover MyRisk testing. [employer] trying to appeal but denied. Pt and I have received paperwork with this. Wanted to reassure pt that she won't be responsible for bill and to f/u with me if she does get one from [employer]. Explained it was [employer] trying to appeal case in order to get paid but will get written off if not covered. Pt understands and appreciative.  

## 2020-06-26 ENCOUNTER — Other Ambulatory Visit: Payer: Self-pay | Admitting: Family Medicine

## 2020-07-01 DIAGNOSIS — G4733 Obstructive sleep apnea (adult) (pediatric): Secondary | ICD-10-CM | POA: Diagnosis not present

## 2020-07-12 DIAGNOSIS — G5701 Lesion of sciatic nerve, right lower limb: Secondary | ICD-10-CM | POA: Diagnosis not present

## 2020-07-28 ENCOUNTER — Other Ambulatory Visit: Payer: Self-pay

## 2020-07-28 DIAGNOSIS — K219 Gastro-esophageal reflux disease without esophagitis: Secondary | ICD-10-CM

## 2020-07-28 DIAGNOSIS — Z1211 Encounter for screening for malignant neoplasm of colon: Secondary | ICD-10-CM

## 2020-07-28 MED ORDER — CLENPIQ 10-3.5-12 MG-GM -GM/160ML PO SOLN: 1.0000 | ORAL | 0 refills | Status: DC

## 2020-08-10 ENCOUNTER — Other Ambulatory Visit: Payer: Self-pay

## 2020-08-10 ENCOUNTER — Other Ambulatory Visit
Admission: RE | Admit: 2020-08-10 | Discharge: 2020-08-10 | Disposition: A | Discharge status: Home / Self Care | Payer: BC Managed Care – PPO | Source: Ambulatory Visit | Attending: Gastroenterology | Admitting: Gastroenterology

## 2020-08-10 DIAGNOSIS — Z01812 Encounter for preprocedural laboratory examination: Secondary | ICD-10-CM | POA: Diagnosis not present

## 2020-08-10 DIAGNOSIS — Z20822 Contact with and (suspected) exposure to covid-19: Secondary | ICD-10-CM | POA: Insufficient documentation

## 2020-08-10 LAB — SARS CORONAVIRUS 2 (TAT 6-24 HRS): SARS Coronavirus 2: NEGATIVE

## 2020-08-12 ENCOUNTER — Ambulatory Visit: Payer: BC Managed Care – PPO | Admitting: Certified Registered"

## 2020-08-12 ENCOUNTER — Ambulatory Visit
Admission: RE | Admit: 2020-08-12 | Discharge: 2020-08-12 | Disposition: A | Discharge status: Home / Self Care | Payer: BC Managed Care – PPO | Attending: Gastroenterology | Admitting: Gastroenterology

## 2020-08-12 ENCOUNTER — Other Ambulatory Visit: Payer: Self-pay

## 2020-08-12 ENCOUNTER — Encounter: Payer: Self-pay | Admitting: Gastroenterology

## 2020-08-12 ENCOUNTER — Encounter
Admission: RE | Disposition: A | Discharge status: Home / Self Care | Payer: Self-pay | Source: Home / Self Care | Attending: Gastroenterology

## 2020-08-12 DIAGNOSIS — K2950 Unspecified chronic gastritis without bleeding: Secondary | ICD-10-CM | POA: Insufficient documentation

## 2020-08-12 DIAGNOSIS — G4730 Sleep apnea, unspecified: Secondary | ICD-10-CM | POA: Diagnosis not present

## 2020-08-12 DIAGNOSIS — K219 Gastro-esophageal reflux disease without esophagitis: Secondary | ICD-10-CM | POA: Diagnosis not present

## 2020-08-12 DIAGNOSIS — K2900 Acute gastritis without bleeding: Secondary | ICD-10-CM | POA: Diagnosis not present

## 2020-08-12 DIAGNOSIS — K640 First degree hemorrhoids: Secondary | ICD-10-CM | POA: Diagnosis not present

## 2020-08-12 DIAGNOSIS — K449 Diaphragmatic hernia without obstruction or gangrene: Secondary | ICD-10-CM | POA: Insufficient documentation

## 2020-08-12 DIAGNOSIS — E119 Type 2 diabetes mellitus without complications: Secondary | ICD-10-CM | POA: Insufficient documentation

## 2020-08-12 DIAGNOSIS — K5790 Diverticulosis of intestine, part unspecified, without perforation or abscess without bleeding: Secondary | ICD-10-CM | POA: Diagnosis not present

## 2020-08-12 DIAGNOSIS — K3189 Other diseases of stomach and duodenum: Secondary | ICD-10-CM | POA: Diagnosis not present

## 2020-08-12 DIAGNOSIS — K5730 Diverticulosis of large intestine without perforation or abscess without bleeding: Secondary | ICD-10-CM | POA: Insufficient documentation

## 2020-08-12 DIAGNOSIS — Z79899 Other long term (current) drug therapy: Secondary | ICD-10-CM | POA: Diagnosis not present

## 2020-08-12 DIAGNOSIS — Z860100 Personal history of colon polyps, unspecified: Secondary | ICD-10-CM

## 2020-08-12 DIAGNOSIS — R1310 Dysphagia, unspecified: Secondary | ICD-10-CM | POA: Insufficient documentation

## 2020-08-12 DIAGNOSIS — Z1211 Encounter for screening for malignant neoplasm of colon: Secondary | ICD-10-CM | POA: Diagnosis not present

## 2020-08-12 DIAGNOSIS — Z7984 Long term (current) use of oral hypoglycemic drugs: Secondary | ICD-10-CM | POA: Diagnosis not present

## 2020-08-12 DIAGNOSIS — K6389 Other specified diseases of intestine: Secondary | ICD-10-CM | POA: Diagnosis not present

## 2020-08-12 DIAGNOSIS — K2970 Gastritis, unspecified, without bleeding: Secondary | ICD-10-CM | POA: Diagnosis not present

## 2020-08-12 HISTORY — PX: COLONOSCOPY WITH PROPOFOL: SHX5780

## 2020-08-12 HISTORY — DX: Sleep apnea, unspecified: G47.30

## 2020-08-12 HISTORY — PX: ESOPHAGOGASTRODUODENOSCOPY (EGD) WITH PROPOFOL: SHX5813

## 2020-08-12 LAB — GLUCOSE, CAPILLARY: Glucose-Capillary: 153 mg/dL — ABNORMAL HIGH (ref 70–99)

## 2020-08-12 SURGERY — COLONOSCOPY WITH PROPOFOL
Anesthesia: General

## 2020-08-12 MED ORDER — PROPOFOL 10 MG/ML IV BOLUS
INTRAVENOUS | Status: DC | PRN
  Administered 2020-08-12 (×2): 20 mg via INTRAVENOUS
  Administered 2020-08-12: 100 mg via INTRAVENOUS
  Administered 2020-08-12: 30 mg via INTRAVENOUS
  Administered 2020-08-12: 20 mg via INTRAVENOUS

## 2020-08-12 MED ORDER — PROPOFOL 10 MG/ML IV BOLUS
INTRAVENOUS | Status: AC
  Filled 2020-08-12: qty 40

## 2020-08-12 MED ORDER — LIDOCAINE HCL (CARDIAC) PF 100 MG/5ML IV SOSY
PREFILLED_SYRINGE | INTRAVENOUS | Status: DC | PRN
  Administered 2020-08-12: 50 mg via INTRAVENOUS

## 2020-08-12 MED ORDER — SODIUM CHLORIDE 0.9 % IV SOLN: INTRAVENOUS | Status: DC

## 2020-08-12 MED ORDER — PROPOFOL 500 MG/50ML IV EMUL
INTRAVENOUS | Status: DC | PRN
  Administered 2020-08-12: 150 ug/kg/min via INTRAVENOUS

## 2020-08-12 NOTE — Anesthesia Preprocedure Evaluation (Signed)
Anesthesia Evaluation  Patient identified by MRN, date of birth, ID band Patient awake    Reviewed: Allergy & Precautions, H&P , NPO status , Patient's Chart, lab work & pertinent test results, reviewed documented beta blocker date and time   Airway Mallampati: II   Neck ROM: full    Dental  (+) Teeth Intact   Pulmonary sleep apnea ,    Pulmonary exam normal        Cardiovascular Exercise Tolerance: Good (-) anginanegative cardio ROS Normal cardiovascular exam Rhythm:regular Rate:Normal     Neuro/Psych negative neurological ROS  negative psych ROS   GI/Hepatic negative GI ROS, Neg liver ROS, GERD  Medicated,  Endo/Other  negative endocrine ROSdiabetes  Renal/GU negative Renal ROS  negative genitourinary   Musculoskeletal  (+) Arthritis , Osteoarthritis,    Abdominal   Peds negative pediatric ROS (+)  Hematology negative hematology ROS (+)   Anesthesia Other Findings Past Medical History:   Diabetes mellitus without complication (HCC)                 Arthritis                                                    History of diverticulitis                                    Allergy                                                      GERD (gastroesophageal reflux disease)                       History of colon polyps                                      History of kidney stones                        04/15/15    Past Surgical History:   BREAST SURGERY                                                BILATERAL CARPAL TUNNEL RELEASE                               CHOLECYSTECTOMY                                               cervical neck fusion  BMI    Body Mass Index   36.77 kg/m 2     Reproductive/Obstetrics                             Anesthesia Physical  Anesthesia Plan  ASA: II  Anesthesia Plan: General   Post-op Pain Management:     Induction:   PONV Risk Score and Plan: Propofol infusion  Airway Management Planned: Nasal Cannula  Additional Equipment:   Intra-op Plan:   Post-operative Plan:   Informed Consent: I have reviewed the patients History and Physical, chart, labs and discussed the procedure including the risks, benefits and alternatives for the proposed anesthesia with the patient or authorized representative who has indicated his/her understanding and acceptance.     Dental Advisory Given  Plan Discussed with: CRNA  Anesthesia Plan Comments:         Anesthesia Quick Evaluation

## 2020-08-12 NOTE — Anesthesia Postprocedure Evaluation (Signed)
Anesthesia Post Note  Patient: Brittany Santos  Procedure(s) Performed: COLONOSCOPY WITH PROPOFOL (N/A ) ESOPHAGOGASTRODUODENOSCOPY (EGD) WITH PROPOFOL (N/A )  Patient location during evaluation: Endoscopy Anesthesia Type: General Level of consciousness: awake and alert and oriented Pain management: pain level controlled Vital Signs Assessment: post-procedure vital signs reviewed and stable Respiratory status: spontaneous breathing Cardiovascular status: blood pressure returned to baseline Anesthetic complications: no   No complications documented.   Last Vitals:  Vitals:   08/12/20 0717 08/12/20 0816  BP: (!) 156/93 (!) 144/72  Pulse: 81 76  Resp: 18 19  Temp: (!) 35.9 C   SpO2: 100% 99%    Last Pain:  Vitals:   08/12/20 0717  TempSrc: Tympanic  PainSc: 0-No pain                 Derrick Tiegs

## 2020-08-12 NOTE — H&P (Signed)
[name], MD [facility] [address], [place], [place] [address] [phone] Fax : [phone]  Primary Care Physician:  Leone Haven, MD Primary Gastroenterologist:  Dr. Allen Norris  Pre-Procedure History & Physical: HPI:  Brittany Santos is a 59 y.o. female is here for an endoscopy and colonoscopy.   Past Medical History:  Diagnosis Date  . Allergy   . Arthritis   . BRCA negative 01/2020   MyRisk neg  . Diabetes mellitus without complication ([place])   . Family history of breast cancer   . GERD (gastroesophageal reflux disease)   . History of colon polyps 2016, 2017   at [facility]  . History of diverticulitis   . History of kidney stones 04/15/15  . Increased risk of breast cancer 01/2020   IBIS=26.6%  . Sleep apnea     Past Surgical History:  Procedure Laterality Date  . BILATERAL CARPAL TUNNEL RELEASE    . BREAST SURGERY    . cervical neck fusion    . CHOLECYSTECTOMY    . COLONOSCOPY  2017   Dr. Gustavo Lah with polyp; repeat due 2021  . ENDOVENOUS ABLATION SAPHENOUS VEIN W/ LASER Left 04/15/2020   endovenous laser ablation left greater saphenous vein by Gae Gallop MD   . ESOPHAGOGASTRODUODENOSCOPY (EGD) WITH PROPOFOL N/A 05/12/2016   Procedure: ESOPHAGOGASTRODUODENOSCOPY (EGD) WITH PROPOFOL;  Surgeon: Lollie Sails, MD;  Location: [facility] ENDOSCOPY;  Service: Endoscopy;  Laterality: N/A;    Prior to Admission medications   Medication Sig Start Date End Date Taking? Authorizing Provider  BIOTIN PO Take 1 tablet by mouth daily.    [provider]  Calcium-Magnesium-Vitamin D (CALCIUM 500 PO) Take by mouth.    [provider]  cetirizine (ZYRTEC) 10 MG tablet Take 10 mg by mouth daily.    [provider]  dexlansoprazole (DEXILANT) 60 MG capsule Take 1 capsule (60 mg total) by mouth daily. 04/06/20   Coral Spikes, DO  diclofenac (VOLTAREN) 75 MG EC tablet  11/13/16   [provider]  famotidine (PEPCID) 20 MG tablet  Take 1 tablet (20 mg total) by mouth 2 (two) times daily. 05/10/20   [name], MD  glucose blood test strip Dispense based on patient preference and insurance preference, Use as once daily Dx: E11.9 01/09/20   Leone Haven, MD  JARDIANCE 10 MG TABS tablet TAKE 1 TABLET BY MOUTH DAILY 06/28/20   Leone Haven, MD  Lancet Devices (ONE TOUCH DELICA LANCING DEV) MISC Use as directed Dx: E11.9 09/29/15   Rubbie Battiest, RN  magic mouthwash SOLN Take 10 mLs by mouth 4 (four) times daily as needed (throat pain). 08/30/19   Katy Apo, NP  metFORMIN (GLUCOPHAGE-XR) 500 MG 24 hr tablet TAKE 2 TABLETS (=1,$RemoveBefor'000MG'JdjJQBLXAhYw$    TOTAL) 2 TIMES DAILY WITH  MEALS 06/03/20   Leone Haven, MD  Multiple Vitamin (MULTIVITAMIN) capsule Take 1 capsule by mouth daily.    [provider]  progesterone (PROMETRIUM) 100 MG capsule Take 1 capsule (100 mg total) by mouth daily. 0/17/51   Copland, Deirdre Evener, PA-C  Sod Picosulfate-Mag Ox-Cit Acd (CLENPIQ) 10-3.5-12 MG-GM -GM/160ML SOLN Take 1 kit by mouth as directed. 07/28/20   [name], MD  sucralfate (CARAFATE) 1 g tablet Take 1 tablet (1 g total) by mouth 4 (four) times daily. 05/04/20 06/03/20  [name], MD  traMADol (ULTRAM) 50 MG tablet Take 50 mg by mouth every 6 (six) hours as needed.    [provider]  vitamin B-12 (CYANOCOBALAMIN) 100 MCG tablet Take 100 mcg by mouth daily.    [provider]  pantoprazole (PROTONIX) 40 MG tablet Take 1 tablet (40 mg total) by mouth 2 (two) times daily. **PLEASE SCHEDULE FOLLOW UP APPT** 12/30/19 04/06/20  [name], MD    Allergies as of 07/29/2020 - Review Complete 06/22/2020  Allergen Reaction Noted  . Clarithromycin  03/14/2019  . Vicodin [hydrocodone-acetaminophen] Itching and Rash 01/20/2015    Family History  Problem Relation Age of Onset  . Hypertension Brother   . Breast cancer Mother        twice--30s and 49 (bilat breasts)  . Cancer Sister 64       Blood cancer (unknown)    . Cancer Maternal Aunt        Lung  . Kidney cancer Maternal Uncle 8  . Heart attack Paternal Uncle   . Aneurysm Paternal Grandmother   . Prostate cancer Maternal Uncle 79       needed seed tx  . Prostate cancer Cousin 75  . Diabetes Neg Hx     Social History   Socioeconomic History  . Marital status: Single    Spouse name: Not on file  . Number of children: Not on file  . Years of education: Not on file  . Highest education level: Not on file  Occupational History  . Not on file  Tobacco Use  . Smoking status: Never Smoker  . Smokeless tobacco: Never Used  Vaping Use  . Vaping Use: Never used  Substance and Sexual Activity  . Alcohol use: No    Alcohol/week: 0.0 standard drinks  . Drug use: No  . Sexual activity: Not Currently    Birth control/protection: Post-menopausal  Other Topics Concern  . Not on file  Social History Narrative   Works- [employer]    Mother lives with pt   No children    1 dog lives inside    Right handed    Caffeine- 1 piece chocolate occasionally    Associate degree   Enjoys- gardening and going to concerts    Social Determinants of Health   Financial Resource Strain:   . Difficulty of Paying Living Expenses: Not on file  Food Insecurity:   . Worried About Charity fundraiser in the Last Year: Not on file  . Ran Out of Food in the Last Year: Not on file  Transportation Needs:   . Lack of Transportation (Medical): Not on file  . Lack of Transportation (Non-Medical): Not on file  Physical Activity:   . Days of Exercise per Week: Not on file  . Minutes of Exercise per Session: Not on file  Stress:   . Feeling of Stress : Not on file  Social Connections:   . Frequency of Communication with Friends and Family: Not on file  . Frequency of Social Gatherings with Friends and Family: Not on file  . Attends Religious Services: Not on file  . Active Member of Clubs or Organizations: Not on file  . Attends Archivist  Meetings: Not on file  . Marital Status: Not on file  Intimate Partner Violence:   . Fear of Current or Ex-Partner: Not on file  . Emotionally Abused: Not on file  . Physically Abused: Not on file  . Sexually Abused: Not on file    Review of Systems: See HPI, otherwise negative ROS  Physical Exam: BP (!) 156/93   Pulse 81  Temp (!) 96.6 F (35.9 C) (Tympanic)   Resp 18   Ht $R'5\' 4"'QF$  (1.626 m)   Wt 99.8 kg   LMP  (LMP Unknown) Comment: Patient states she had her "uterus scraped" sometime in the past 10 years.   SpO2 100%   BMI 37.76 kg/m  General:   Alert,  pleasant and cooperative in NAD Head:  Normocephalic and atraumatic. Neck:  Supple; no masses or thyromegaly. Lungs:  Clear throughout to auscultation.    Heart:  Regular rate and rhythm. Abdomen:  Soft, nontender and nondistended. Normal bowel sounds, without guarding, and without rebound.   Neurologic:  Alert and  oriented x4;  grossly normal neurologically.  Impression/Plan: Arzella Rehmann [id number] is here for an endoscopy and colonoscopy to be performed for GERD, dysphagia and history of polyps that were adenomatous.  The patient's last colonoscopy was in January 2016.    Risks, benefits, limitations, and alternatives regarding  endoscopy and colonoscopy have been reviewed with the patient.  Questions have been answered.  All parties agreeable.   [name], MD  08/12/2020, 7:31 AM

## 2020-08-12 NOTE — Op Note (Signed)
[facility] Gastroenterology Patient Name: Brittany Santos Procedure Date: 08/12/2020 7:39 AM MRN: [id number] Account #: [id number] Date of Birth: 1961-05-03 Admit Type: Outpatient Age: 59 Room: [facility] ENDO ROOM 2 Gender: Female Note Status: Finalized Procedure:             Upper GI endoscopy Indications:           Heartburn Providers:             [name] MD, MD Referring MD:          Angela Adam. Caryl Bis (Referring MD) Medicines:             Propofol per Anesthesia Complications:         No immediate complications. Procedure:             Pre-Anesthesia Assessment:                        - Prior to the procedure, a History and Physical was                         performed, and patient medications and allergies were                         reviewed. The patient's tolerance of previous                         anesthesia was also reviewed. The risks and benefits                         of the procedure and the sedation options and risks                         were discussed with the patient. All questions were                         answered, and informed consent was obtained. Prior                         Anticoagulants: The patient has taken no previous                         anticoagulant or antiplatelet agents. ASA Grade                         Assessment: II - A patient with mild systemic disease.                         After reviewing the risks and benefits, the patient                         was deemed in satisfactory condition to undergo the                         procedure.                        After obtaining informed consent, the endoscope was  passed under direct vision. Throughout the procedure,                         the patient's blood pressure, pulse, and oxygen                         saturations were monitored continuously. The Endoscope                         was introduced through the mouth, and advanced to the                          second part of duodenum. The upper GI endoscopy was                         accomplished without difficulty. The patient tolerated                         the procedure well. Findings:      A small hiatal hernia was present.      Localized mild inflammation characterized by erythema was found in the       gastric antrum. Biopsies were taken with a cold forceps for histology.      The examined duodenum was normal. Impression:            - Small hiatal hernia.                        - Gastritis. Biopsied.                        - Normal examined duodenum. Recommendation:        - Await pathology results.                        - Discharge patient to home.                        - Resume previous diet.                        - Perform a colonoscopy today. Procedure Code(s):     --- Professional ---                        [phone], Esophagogastroduodenoscopy, flexible,                         transoral; with biopsy, single or multiple Diagnosis Code(s):     --- Professional ---                        R12, Heartburn                        K29.70, Gastritis, unspecified, without bleeding CPT copyright 2019 American Medical Association. All rights reserved. The codes documented in this report are preliminary and upon coder review may  be revised to meet current compliance requirements. [name] MD, MD 08/12/2020 7:56:52 AM This report has been signed electronically. Number of Addenda: 0 Note Initiated On: 08/12/2020 7:39 AM Estimated Blood Loss:  Estimated blood loss: none.  [facility]

## 2020-08-12 NOTE — Op Note (Signed)
[facility] Gastroenterology Patient Name: Brittany Santos Procedure Date: 08/12/2020 7:38 AM MRN: [id number] Account #: [id number] Date of Birth: 01/04/1961 Admit Type: Outpatient Age: 59 Room: [facility] ENDO ROOM 2 Gender: Female Note Status: Finalized Procedure:             Colonoscopy Indications:           High risk colon cancer surveillance: Personal history                         of colonic polyps Providers:             [name] MD, MD Medicines:             Propofol per Anesthesia Complications:         No immediate complications. Procedure:             Pre-Anesthesia Assessment:                        - Prior to the procedure, a History and Physical was                         performed, and patient medications and allergies were                         reviewed. The patient's tolerance of previous                         anesthesia was also reviewed. The risks and benefits                         of the procedure and the sedation options and risks                         were discussed with the patient. All questions were                         answered, and informed consent was obtained. Prior                         Anticoagulants: The patient has taken no previous                         anticoagulant or antiplatelet agents. ASA Grade                         Assessment: II - A patient with mild systemic disease.                         After reviewing the risks and benefits, the patient                         was deemed in satisfactory condition to undergo the                         procedure.                        After obtaining informed consent, the colonoscope was  passed under direct vision. Throughout the procedure,                         the patient's blood pressure, pulse, and oxygen                         saturations were monitored continuously. The                         Colonoscope was introduced through the anus  and                         advanced to the the cecum, identified by appendiceal                         orifice and ileocecal valve. The colonoscopy was                         performed without difficulty. The patient tolerated                         the procedure well. The quality of the bowel                         preparation was excellent. Findings:      The perianal and digital rectal examinations were normal.      A localized area of moderately friable mucosa with no bleeding was found       at the ileocecal valve. This was biopsied with a cold forceps for       histology.      Multiple small-mouthed diverticula were found in the sigmoid colon.      Non-bleeding internal hemorrhoids were found during retroflexion. The       hemorrhoids were Grade I (internal hemorrhoids that do not prolapse). Impression:            - Friability with no bleeding at the ileocecal valve.                         Biopsied.                        - Diverticulosis in the sigmoid colon.                        - Non-bleeding internal hemorrhoids. Recommendation:        - Discharge patient to home.                        - Resume previous diet.                        - Continue present medications.                        - Await pathology results.                        - Repeat colonoscopy in 7 years for surveillance. Procedure Code(s):     --- Professional ---  45380, Colonoscopy, flexible; with biopsy, single or                         multiple Diagnosis Code(s):     --- Professional ---                        Z86.010, Personal history of colonic polyps                        K57.30, Diverticulosis of large intestine without                         perforation or abscess without bleeding CPT copyright 2019 American Medical Association. All rights reserved. The codes documented in this report are preliminary and upon coder review may  be revised to meet current compliance  requirements. [name] MD, MD 08/12/2020 8:11:34 AM This report has been signed electronically. Number of Addenda: 0 Note Initiated On: 08/12/2020 7:38 AM Scope Withdrawal Time: 0 hours 6 minutes 8 seconds  Total Procedure Duration: 0 hours 9 minutes 9 seconds  Estimated Blood Loss:  Estimated blood loss: none.      [facility]

## 2020-08-12 NOTE — Transfer of Care (Signed)
Immediate Anesthesia Transfer of Care Note  Patient: Brittany Santos [id number]  Procedure(s) Performed: COLONOSCOPY WITH PROPOFOL (N/A ) ESOPHAGOGASTRODUODENOSCOPY (EGD) WITH PROPOFOL (N/A )  Patient Location: PACU  Anesthesia Type:MAC  Level of Consciousness: awake and oriented  Airway & Oxygen Therapy: Patient Spontanous Breathing  Post-op Assessment: Report given to RN and Post -op Vital signs reviewed and stable  Post vital signs: stable  Last Vitals:  Vitals Value Taken Time  BP 144/72 08/12/20 0816  Temp    Pulse 74 08/12/20 0816  Resp 18 08/12/20 0816  SpO2 98 % 08/12/20 0816  Vitals shown include unvalidated device data.  Last Pain:  Vitals:   08/12/20 0717  TempSrc: Tympanic  PainSc: 0-No pain         Complications: No complications documented.

## 2020-08-13 ENCOUNTER — Encounter: Payer: Self-pay | Admitting: Gastroenterology

## 2020-08-13 LAB — SURGICAL PATHOLOGY

## 2020-08-16 ENCOUNTER — Encounter: Payer: Self-pay | Admitting: Gastroenterology

## 2020-08-25 ENCOUNTER — Ambulatory Visit
Admission: RE | Admit: 2020-08-25 | Discharge: 2020-08-25 | Disposition: A | Discharge status: Home / Self Care | Payer: BC Managed Care – PPO | Source: Ambulatory Visit | Attending: Obstetrics and Gynecology | Admitting: Obstetrics and Gynecology

## 2020-08-25 DIAGNOSIS — Z1231 Encounter for screening mammogram for malignant neoplasm of breast: Secondary | ICD-10-CM | POA: Insufficient documentation

## 2020-08-27 ENCOUNTER — Other Ambulatory Visit: Payer: Self-pay | Admitting: Obstetrics and Gynecology

## 2020-08-27 ENCOUNTER — Encounter: Payer: Self-pay | Admitting: Obstetrics and Gynecology

## 2020-08-27 DIAGNOSIS — R928 Other abnormal and inconclusive findings on diagnostic imaging of breast: Secondary | ICD-10-CM

## 2020-09-09 ENCOUNTER — Ambulatory Visit: Payer: BC Managed Care – PPO | Admitting: Vascular Surgery

## 2020-09-13 ENCOUNTER — Ambulatory Visit: Payer: BC Managed Care – PPO

## 2020-09-13 ENCOUNTER — Other Ambulatory Visit: Payer: BC Managed Care – PPO

## 2020-09-16 ENCOUNTER — Other Ambulatory Visit: Payer: Self-pay

## 2020-09-16 ENCOUNTER — Ambulatory Visit (INDEPENDENT_AMBULATORY_CARE_PROVIDER_SITE_OTHER): Payer: BC Managed Care – PPO | Admitting: Vascular Surgery

## 2020-09-16 ENCOUNTER — Encounter: Payer: Self-pay | Admitting: Vascular Surgery

## 2020-09-16 VITALS — BP 122/76 | HR 83 | Temp 97.9°F | Resp 16 | Ht 63.0 in | Wt 228.0 lb

## 2020-09-16 DIAGNOSIS — I83812 Varicose veins of left lower extremities with pain: Secondary | ICD-10-CM

## 2020-09-16 NOTE — Progress Notes (Signed)
REASON FOR VISIT:   Follow-up of chronic venous insufficiency  MEDICAL ISSUES:   CHRONIC VENOUS INSUFFICIENCY: This patient has CEAP C4a venous disease.  She is undergone successful laser ablation of the left great saphenous vein and her symptoms have improved but she still having some lower calf aching and heaviness associated with standing and relieved with elevation.  Again we have discussed the importance of leg elevation.  She has been wearing her compression stockings.  I think she would be a good candidate for laser ablation of the left small saphenous vein.  By duplex the vein is significantly dilated with diameters ranging from 0.51-0.57 cm.  We would like to cannulate this in the mid calf.  We will schedule this in the near future.   HPI:   Brittany Santos [id number] is a pleasant 59 y.o. female who I first saw in January of this year with chronic venous insufficiency. She had previously been seen by the nurse practitioner in October 2020 with leg swelling. 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 When I saw her in January she had reflux in both great saphenous veins and also in the left small saphenous vein. She failed conservative treatment and I felt she was a candidate for laser ablation of the left great saphenous vein and possibly the left small saphenous vein. On 04/15/2020 she underwent laser ablation of the left great saphenous vein. I saw her in follow-up on 04/22/2020. She had successful ablation of the left great saphenous vein no evidence of DVT. I plan on seeing her back in September and if her symptoms persisted we could consider laser ablation of the left small saphenous vein.  Since I saw her last her symptoms in the left leg have improved although she still having some achiness and heaviness in the left calf which is aggravated by standing and sitting and relieved with elevation.  She has been wearing her compression stockings.  He is having some symptoms in the  right leg also and does have a history of sciatica.  She denies recent back pain.  I do not get any clear-cut history of claudication.  Past Medical History:  Diagnosis Date  . Allergy   . Arthritis   . BRCA negative 01/2020   MyRisk neg  . Diabetes mellitus without complication ([place])   . Family history of breast cancer   . GERD (gastroesophageal reflux disease)   . History of colon polyps 2016, 2017   at [facility]  . History of diverticulitis   . History of kidney stones 04/15/15  . Increased risk of breast cancer 01/2020   IBIS=26.6%  . Sleep apnea     Family History  Problem Relation Age of Onset  . Hypertension Brother   . Breast cancer Mother        twice--30s and 67 (bilat breasts)  . Cancer Sister 37       Blood cancer (unknown)  . Cancer Maternal Aunt        Lung  . Kidney cancer Maternal Uncle 56  . Heart attack Paternal Uncle   . Aneurysm Paternal Grandmother   . Prostate cancer Maternal Uncle 79       needed seed tx  . Prostate cancer Cousin 53  . Diabetes Neg Hx     SOCIAL HISTORY: Social History   Tobacco Use  . Smoking status: Never Smoker  . Smokeless tobacco: Never Used  Substance Use Topics  . Alcohol use: No  Alcohol/week: 0.0 standard drinks    Allergies  Allergen Reactions  . Clarithromycin     Bad taste in mouth and made her feel nauseated   . Vicodin [Hydrocodone-Acetaminophen] Itching and Rash    Current Outpatient Medications  Medication Sig Dispense Refill  . BIOTIN PO Take 1 tablet by mouth daily.    . Calcium-Magnesium-Vitamin D (CALCIUM 500 PO) Take by mouth.    . cetirizine (ZYRTEC) 10 MG tablet Take 10 mg by mouth daily.    . famotidine (PEPCID) 20 MG tablet Take 1 tablet (20 mg total) by mouth 2 (two) times daily. 60 tablet 6  . glucose blood test strip Dispense based on patient preference and insurance preference, Use as once daily Dx: E11.9 100 each 12  . JARDIANCE 10 MG TABS tablet TAKE 1 TABLET BY MOUTH DAILY 30 tablet 4  .  Lancet Devices (ONE TOUCH DELICA LANCING DEV) MISC Use as directed Dx: E11.9 1 each 0  . metFORMIN (GLUCOPHAGE-XR) 500 MG 24 hr tablet TAKE 2 TABLETS (=1,000MG   TOTAL) 2 TIMES DAILY WITH  MEALS 360 tablet 1  . Multiple Vitamin (MULTIVITAMIN) capsule Take 1 capsule by mouth daily.    . progesterone (PROMETRIUM) 100 MG capsule Take 1 capsule (100 mg total) by mouth daily. 90 capsule 3  . Sod Picosulfate-Mag Ox-Cit Acd (CLENPIQ) 10-3.5-12 MG-GM -GM/160ML SOLN Take 1 kit by mouth as directed. 320 mL 0  . vitamin B-12 (CYANOCOBALAMIN) 100 MCG tablet Take 100 mcg by mouth daily.    Marland Kitchen dexlansoprazole (DEXILANT) 60 MG capsule Take 1 capsule (60 mg total) by mouth daily. 30 capsule 1  . diclofenac (VOLTAREN) 75 MG EC tablet     . magic mouthwash SOLN Take 10 mLs by mouth 4 (four) times daily as needed (throat pain). 120 mL 0  . sucralfate (CARAFATE) 1 g tablet Take 1 tablet (1 g total) by mouth 4 (four) times daily. 120 tablet 2  . traMADol (ULTRAM) 50 MG tablet Take 50 mg by mouth every 6 (six) hours as needed.     No current facility-administered medications for this visit.    REVIEW OF SYSTEMS:  _0  denotes positive finding, _1  denotes negative finding Cardiac  Comments:  Chest pain or chest pressure:    Shortness of breath upon exertion:    Short of breath when lying flat:    Irregular heart rhythm:        Vascular    Pain in calf, thigh, or hip brought on by ambulation:    Pain in feet at night that wakes you up from your sleep:     Blood clot in your veins:    Leg swelling:  x       Pulmonary    Oxygen at home:    Productive cough:     Wheezing:         Neurologic    Sudden weakness in arms or legs:     Sudden numbness in arms or legs:     Sudden onset of difficulty speaking or slurred speech:    Temporary loss of vision in one eye:     Problems with dizziness:         Gastrointestinal    Blood in stool:     Vomited blood:         Genitourinary    Burning when urinating:      Blood in urine:        Psychiatric    Major depression:  Hematologic    Bleeding problems:    Problems with blood clotting too easily:        Skin    Rashes or ulcers:        Constitutional    Fever or chills:     PHYSICAL EXAM:   Vitals:   09/16/20 1559  BP: 122/76  Pulse: 83  Resp: 16  Temp: 97.9 F (36.6 C)  TempSrc: Temporal  SpO2: 99%  Weight: 228 lb (103.4 kg)  Height: _0  (1.6 m)   Body mass index is 40.39 kg/m.  GENERAL: The patient is a well-nourished female, in no acute distress. The vital signs are documented above. CARDIAC: There is a regular rate and rhythm.  VASCULAR: I do not detect carotid bruits. I cannot palpate pedal pulses however she has a biphasic dorsalis pedis and posterior tibial signal bilaterally. I looked at her small saphenous vein with the SonoSite and it is significantly dilated with reflux to the mid calf.  I think the vein could be cannulated in the mid calf.  The right great saphenous vein has reflux but the vein is not especially dilated. PULMONARY: There is good air exchange bilaterally without wheezing or rales. ABDOMEN: Soft and non-tender with normal pitched bowel sounds.  MUSCULOSKELETAL: There are no major deformities or cyanosis. NEUROLOGIC: No focal weakness or paresthesias are detected. SKIN: There are no ulcers or rashes noted. PSYCHIATRIC: The patient has a normal affect.  DATA:    No new data.  Deitra Mayo [facility] [phone]

## 2020-09-21 ENCOUNTER — Other Ambulatory Visit: Payer: Self-pay | Admitting: *Deleted

## 2020-09-21 DIAGNOSIS — I83812 Varicose veins of left lower extremities with pain: Secondary | ICD-10-CM

## 2020-09-22 ENCOUNTER — Other Ambulatory Visit: Payer: Self-pay

## 2020-09-22 ENCOUNTER — Ambulatory Visit
Admission: RE | Admit: 2020-09-22 | Discharge: 2020-09-22 | Disposition: A | Discharge status: Home / Self Care | Payer: BC Managed Care – PPO | Source: Ambulatory Visit | Attending: Obstetrics and Gynecology | Admitting: Obstetrics and Gynecology

## 2020-09-22 DIAGNOSIS — R928 Other abnormal and inconclusive findings on diagnostic imaging of breast: Secondary | ICD-10-CM | POA: Diagnosis not present

## 2020-09-22 DIAGNOSIS — N6489 Other specified disorders of breast: Secondary | ICD-10-CM | POA: Diagnosis not present

## 2020-09-22 DIAGNOSIS — R922 Inconclusive mammogram: Secondary | ICD-10-CM | POA: Diagnosis not present

## 2020-09-30 DIAGNOSIS — G4733 Obstructive sleep apnea (adult) (pediatric): Secondary | ICD-10-CM | POA: Diagnosis not present

## 2020-10-21 ENCOUNTER — Encounter: Payer: Self-pay | Admitting: Vascular Surgery

## 2020-10-21 ENCOUNTER — Other Ambulatory Visit: Payer: Self-pay

## 2020-10-21 ENCOUNTER — Ambulatory Visit (INDEPENDENT_AMBULATORY_CARE_PROVIDER_SITE_OTHER): Payer: BC Managed Care – PPO | Admitting: Vascular Surgery

## 2020-10-21 VITALS — BP 130/84 | HR 80 | Temp 97.8°F | Resp 18 | Ht 64.0 in | Wt 228.0 lb

## 2020-10-21 DIAGNOSIS — I83812 Varicose veins of left lower extremities with pain: Secondary | ICD-10-CM | POA: Diagnosis not present

## 2020-10-21 DIAGNOSIS — I872 Venous insufficiency (chronic) (peripheral): Secondary | ICD-10-CM

## 2020-10-21 HISTORY — PX: ENDOVENOUS ABLATION SAPHENOUS VEIN W/ LASER: SUR449

## 2020-10-21 NOTE — Progress Notes (Signed)
     Laser Ablation Procedure    Date: 10/21/2020   Brittany Santos [id number] DOB:02-24-1961  Consent signed: Yes      Surgeon: Gae Gallop MD   Procedure: Laser Ablation: left Small Saphenous Vein  BP 130/84 (BP Location: Right Arm, Patient Position: Sitting, Cuff Size: Large)   Pulse 80   Temp 97.8 F (36.6 C) (Temporal)   Resp 18   Ht 5\' 4"  (1.626 m)   Wt 228 lb (103.4 kg)   LMP  (LMP Unknown) Comment: Patient states she had her "uterus scraped" sometime in the past 10 years.   SpO2 100%   BMI 39.14 kg/m   Tumescent Anesthesia: 220 cc 0.9% NaCl with 50 cc Lidocaine HCL 1%  and 15 cc 8.4% NaHCO3  Local Anesthesia: 8 cc Lidocaine HCL and NaHCO3 (ratio 2:1)  7 watts continuous mode     Total energy: 669.6 Joules     Total time: 95 seconds Treatment Length 18 cm  Laser Fiber Ref. #  [id number]    Lot # [id number]     Patient tolerated procedure well  Notes: Patient wore face mask.  All staff members wore facial masks and facial shields/goggles.    Description of Procedure:  After marking the course of the secondary varicosities, the patient was placed on the operating table in the prone position, and the left leg was prepped and draped in sterile fashion.   Local anesthetic was administered and under ultrasound guidance the saphenous vein was accessed with a micro needle and guide wire; then the mirco puncture sheath was placed.  A guide wire was inserted saphenopopliteal junction , followed by a 5 french sheath.  The position of the sheath and then the laser fiber below the junction was confirmed using the ultrasound.  Tumescent anesthesia was administered along the course of the saphenous vein using ultrasound guidance. The patient was placed in Trendelenburg position and protective laser glasses were placed on patient and staff, and the laser was fired at 7 watts continuous mode for a total of 669.6 joules.       Steri strip was applied to the IV insertion site and ABD  pads and thigh high compression stockings were applied.  Ace wrap bandages were applied over the left calf and thigh and at the top of the saphenopopliteal junction. Blood loss was less than 15 cc.  Discharge instructions reviewed with patient and hardcopy of discharge instructions given to patient to take home. The patient ambulated out of the operating room having tolerated the procedure well.

## 2020-10-21 NOTE — Progress Notes (Signed)
Patient name: Brittany Santos SWNIOEVOJ MRN: [id number] DOB: 03/22/1961 Sex: female  REASON FOR VISIT: For laser ablation of the left small saphenous vein  HPI: Brittany Santos is a 59 y.o. female who would presented with CEAP C4a venous disease.  She underwent successful laser ablation of the left great saphenous vein and now presents for staged laser ablation of the left small saphenous vein.  Current Outpatient Medications  Medication Sig Dispense Refill  . BIOTIN PO Take 1 tablet by mouth daily.    . Calcium-Magnesium-Vitamin D (CALCIUM 500 PO) Take by mouth.    . cetirizine (ZYRTEC) 10 MG tablet Take 10 mg by mouth daily.    Marland Kitchen dexlansoprazole (DEXILANT) 60 MG capsule Take 1 capsule (60 mg total) by mouth daily. 30 capsule 1  . diclofenac (VOLTAREN) 75 MG EC tablet     . famotidine (PEPCID) 20 MG tablet Take 1 tablet (20 mg total) by mouth 2 (two) times daily. 60 tablet 6  . glucose blood test strip Dispense based on patient preference and insurance preference, Use as once daily Dx: E11.9 100 each 12  . JARDIANCE 10 MG TABS tablet TAKE 1 TABLET BY MOUTH DAILY 30 tablet 4  . Lancet Devices (ONE TOUCH DELICA LANCING DEV) MISC Use as directed Dx: E11.9 1 each 0  . magic mouthwash SOLN Take 10 mLs by mouth 4 (four) times daily as needed (throat pain). 120 mL 0  . metFORMIN (GLUCOPHAGE-XR) 500 MG 24 hr tablet TAKE 2 TABLETS (=1,$RemoveBefor'000MG'ZrlKHlyYEsmd$    TOTAL) 2 TIMES DAILY WITH  MEALS 360 tablet 1  . Multiple Vitamin (MULTIVITAMIN) capsule Take 1 capsule by mouth daily.    . progesterone (PROMETRIUM) 100 MG capsule Take 1 capsule (100 mg total) by mouth daily. 90 capsule 3  . Sod Picosulfate-Mag Ox-Cit Acd (CLENPIQ) 10-3.5-12 MG-GM -GM/160ML SOLN Take 1 kit by mouth as directed. 320 mL 0  . sucralfate (CARAFATE) 1 g tablet Take 1 tablet (1 g total) by mouth 4 (four) times daily. 120 tablet 2  . traMADol (ULTRAM) 50 MG tablet Take 50 mg by mouth every 6 (six) hours as needed.    . vitamin B-12  (CYANOCOBALAMIN) 100 MCG tablet Take 100 mcg by mouth daily.     No current facility-administered medications for this visit.    PHYSICAL EXAM: Vitals:   10/21/20 1045  BP: 130/84  Pulse: 80  Resp: 18  Temp: 97.8 F (36.6 C)  TempSrc: Temporal  SpO2: 100%  Weight: 228 lb (103.4 kg)  Height: $Remove'5\' 4"'sSjxZFm$  (1.626 m)    PROCEDURE: Endovenous laser ablation left small saphenous vein  TECHNIQUE: The patient was taken to the exam room and placed prone.  I looked at the left small saphenous vein myself with the SonoSite and I felt that I could cannulate this in the mid calf.  The left leg was prepped and draped in usual sterile fashion.  Under ultrasound guidance, after the skin was anesthetized, I cannulated the left small saphenous vein in the mid calf with a micropuncture needle and a micropuncture sheath was introduced over wire.  I then advanced the J-wire to just before the vein dove deep to joining the popliteal vein.  Next the 45 cm sheath was advanced over the wire and the wire and dilator removed.  I position the laser fiber just before the vein go deep.  Tumescent anesthesia was then administered circumferentially around the vein.  Laser ablation was then performed from approximately 2 and half centimeters from the  saphenous popliteal junction to the mid calf.  Patient tolerated procedure well.  Pressure dressing was applied.  Patient tolerated procedure well return in 1 week for follow-up duplex.  Deitra Mayo [facility] [place] [phone]

## 2020-10-25 ENCOUNTER — Ambulatory Visit: Payer: BC Managed Care – PPO | Admitting: Family Medicine

## 2020-10-25 DIAGNOSIS — G5701 Lesion of sciatic nerve, right lower limb: Secondary | ICD-10-CM | POA: Diagnosis not present

## 2020-10-25 DIAGNOSIS — M76891 Other specified enthesopathies of right lower limb, excluding foot: Secondary | ICD-10-CM | POA: Diagnosis not present

## 2020-10-27 ENCOUNTER — Other Ambulatory Visit: Payer: Self-pay

## 2020-10-27 ENCOUNTER — Encounter: Payer: Self-pay | Admitting: Vascular Surgery

## 2020-10-27 ENCOUNTER — Encounter (HOSPITAL_COMMUNITY): Payer: BC Managed Care – PPO

## 2020-10-27 ENCOUNTER — Ambulatory Visit: Payer: BC Managed Care – PPO | Admitting: Vascular Surgery

## 2020-10-27 ENCOUNTER — Ambulatory Visit (INDEPENDENT_AMBULATORY_CARE_PROVIDER_SITE_OTHER): Payer: BC Managed Care – PPO | Admitting: Vascular Surgery

## 2020-10-27 ENCOUNTER — Ambulatory Visit (HOSPITAL_COMMUNITY)
Admission: RE | Admit: 2020-10-27 | Discharge: 2020-10-27 | Disposition: A | Discharge status: Home / Self Care | Payer: BC Managed Care – PPO | Source: Ambulatory Visit | Attending: Vascular Surgery | Admitting: Vascular Surgery

## 2020-10-27 VITALS — BP 156/70 | HR 62 | Temp 97.3°F | Resp 20 | Ht 64.0 in | Wt 228.0 lb

## 2020-10-27 DIAGNOSIS — I872 Venous insufficiency (chronic) (peripheral): Secondary | ICD-10-CM | POA: Diagnosis not present

## 2020-10-27 DIAGNOSIS — I8393 Asymptomatic varicose veins of bilateral lower extremities: Secondary | ICD-10-CM

## 2020-10-27 DIAGNOSIS — I83812 Varicose veins of left lower extremities with pain: Secondary | ICD-10-CM

## 2020-10-27 NOTE — Progress Notes (Signed)
Patient name: Brittany Santos MRN: [id number] DOB: July 08, 1961 Sex: female  REASON FOR VISIT: Follow-up after laser ablation of the left small saphenous vein  HPI: Brittany Santos is a 59 y.o. female who had presented with CEAP C4a venous disease.  She had failed conservative treatment.  She underwent successful laser ablation of the left great saphenous vein and presented for staged laser ablation of the left small saphenous vein on 10/21/2020.  She comes in for a follow-up visit.  I treated from the descent of the left small saphenous vein to the mid calf.  Since I saw her last, she has no specific complaints.  She is back at work she sits most of the time.  She is trying to exercise her legs especially can while sitting.  She denies chest pain or shortness of breath.  Current Outpatient Medications  Medication Sig Dispense Refill   BIOTIN PO Take 1 tablet by mouth daily.     Calcium-Magnesium-Vitamin D (CALCIUM 500 PO) Take by mouth.     cetirizine (ZYRTEC) 10 MG tablet Take 10 mg by mouth daily.     diclofenac (VOLTAREN) 75 MG EC tablet      famotidine (PEPCID) 20 MG tablet Take 1 tablet (20 mg total) by mouth 2 (two) times daily. 60 tablet 6   glucose blood test strip Dispense based on patient preference and insurance preference, Use as once daily Dx: E11.9 100 each 12   JARDIANCE 10 MG TABS tablet TAKE 1 TABLET BY MOUTH DAILY 30 tablet 4   Lancet Devices (ONE TOUCH DELICA LANCING DEV) MISC Use as directed Dx: E11.9 1 each 0   metFORMIN (GLUCOPHAGE-XR) 500 MG 24 hr tablet TAKE 2 TABLETS (=1,000MG   TOTAL) 2 TIMES DAILY WITH  MEALS 360 tablet 1   Multiple Vitamin (MULTIVITAMIN) capsule Take 1 capsule by mouth daily.     progesterone (PROMETRIUM) 100 MG capsule Take 1 capsule (100 mg total) by mouth daily. 90 capsule 3   Sod Picosulfate-Mag Ox-Cit Acd (CLENPIQ) 10-3.5-12 MG-GM -GM/160ML SOLN Take 1 kit by mouth as directed. 320 mL 0   traMADol (ULTRAM) 50 MG tablet  Take 50 mg by mouth every 6 (six) hours as needed.     vitamin B-12 (CYANOCOBALAMIN) 100 MCG tablet Take 100 mcg by mouth daily.     No current facility-administered medications for this visit.   REVIEW OF SYSTEMS: Valu.Nieves ] denotes positive finding; [  ] denotes negative finding  CARDIOVASCULAR:  _0  chest pain   _1  dyspnea on exertion  _2  leg swelling  CONSTITUTIONAL:  _3  fever   _4  chills  PHYSICAL EXAM: Vitals:   10/27/20 0827  BP: (!) 156/70  Pulse: 62  Resp: 20  Temp: (!) 97.3 F (36.3 C)  SpO2: 98%  Weight: 228 lb (103.4 kg)  Height: _5  (1.626 m)   GENERAL: The patient is a well-nourished female, in no acute distress. The vital signs are documented above. CARDIOVASCULAR: There is a regular rate and rhythm. PULMONARY: There is good air exchange bilaterally without wheezing or rales. VASCULAR: She has no significant bruising in her posterior left calf.  She she has no significant leg swelling.  DATA:  VENOUS DUPLEX: I have independently interpreted her venous duplex scan today.  There is no evidence of deep venous thrombosis.  The patient has had successful ablation of the left small saphenous vein from the mid calf to 2.1 cm from the saphenous popliteal junction  MEDICAL  ISSUES:  CHRONIC VENOUS INSUFFICIENCY: This patient has CEAP C4a venous disease.  She has now undergone staged ablation of the left great saphenous vein and now the left small saphenous vein.  She does have some spider veins and she will address these in the new year.  I encouraged her to elevate her legs.  We discussed the importance of exercise.  She will wear her thigh-high stockings for another week and then I think he would be fairly transition to a knee-high 15-20 stockings.  I will see her back as needed.  Deitra Mayo [facility] [place] [phone]

## 2020-11-16 DIAGNOSIS — G4733 Obstructive sleep apnea (adult) (pediatric): Secondary | ICD-10-CM | POA: Diagnosis not present

## 2020-11-22 ENCOUNTER — Other Ambulatory Visit: Payer: Self-pay | Admitting: Family Medicine

## 2020-11-26 ENCOUNTER — Ambulatory Visit
Admission: EM | Admit: 2020-11-26 | Discharge: 2020-11-26 | Disposition: A | Discharge status: Home / Self Care | Payer: BC Managed Care – PPO | Attending: Family Medicine | Admitting: Family Medicine

## 2020-11-26 ENCOUNTER — Ambulatory Visit: Payer: BC Managed Care – PPO | Admitting: Family Medicine

## 2020-11-26 DIAGNOSIS — L0291 Cutaneous abscess, unspecified: Secondary | ICD-10-CM

## 2020-11-26 MED ORDER — DOXYCYCLINE HYCLATE 100 MG PO CAPS: 100.0000 mg | ORAL_CAPSULE | Freq: Two times a day (BID) | ORAL | 0 refills | Status: AC

## 2020-11-26 MED ORDER — CEPHALEXIN 500 MG PO CAPS: 500.0000 mg | ORAL_CAPSULE | Freq: Four times a day (QID) | ORAL | 0 refills | Status: DC

## 2020-11-26 NOTE — Discharge Instructions (Signed)
Antibiotics as prescribed.  You can take ibuprofen or Tylenol for pain as needed. Follow up as needed for continued or worsening symptoms

## 2020-11-26 NOTE — ED Provider Notes (Signed)
Roderic Palau    CSN: [id number] Arrival date & time: 11/26/20  7371      History   Chief Complaint Chief Complaint  Patient presents with  . Cyst    HPI Brittany Santos is a 59 y.o. female.   Patient is a 59 year old female who presents today with pain to the posterior neck area.  She reports "knot on the back of neck".  It has been erythematous, painful.  Was initially extending into scalp area but that has somewhat resolved.  No draining from the area or fevers.  Has been treated for this in the past.     Past Medical History:  Diagnosis Date  . Allergy   . Arthritis   . BRCA negative 01/2020   MyRisk neg  . Diabetes mellitus without complication ([place])   . Family history of breast cancer   . GERD (gastroesophageal reflux disease)   . History of colon polyps 2016, 2017   at [facility]  . History of diverticulitis   . History of kidney stones 04/15/15  . Increased risk of breast cancer 01/2020   IBIS=26.6%  . Sleep apnea     Patient Active Problem List   Diagnosis Date Noted  . Acute gastritis without hemorrhage   . Personal history of colonic polyps   . Stress at work 06/22/2020  . Obesity 02/19/2020  . Right shoulder pain 02/16/2020  . Family history of breast cancer 01/15/2020  . Vasomotor symptoms due to menopause 01/15/2020  . Sinus drainage 01/09/2020  . Tonsil pain 08/27/2019  . Bilateral leg edema 04/26/2018  . OSA (obstructive sleep apnea) 04/26/2018  . Foot callus 04/26/2018  . Back pain, chronic 07/06/2016  . Skin lesion 06/19/2016  . IBS (irritable bowel syndrome) 12/21/2015  . GERD (gastroesophageal reflux disease) 04/21/2015  . Type 2 diabetes mellitus ([place]) 01/20/2015    Past Surgical History:  Procedure Laterality Date  . BILATERAL CARPAL TUNNEL RELEASE    . BREAST EXCISIONAL BIOPSY Bilateral    benign  . BREAST SURGERY    . cervical neck fusion    . CHOLECYSTECTOMY    . COLONOSCOPY  2017   Dr. Gustavo Lah with polyp; repeat  due 2021  . COLONOSCOPY WITH PROPOFOL N/A 08/12/2020   Procedure: COLONOSCOPY WITH PROPOFOL;  Surgeon: [name], MD;  Location: [facility] ENDOSCOPY;  Service: Endoscopy;  Laterality: N/A;  . ENDOVENOUS ABLATION SAPHENOUS VEIN W/ LASER Left 04/15/2020   endovenous laser ablation left greater saphenous vein by Gae Gallop MD   . ENDOVENOUS ABLATION SAPHENOUS VEIN W/ LASER Left 10/21/2020   endovenous laser ablation left small saphenous vein by Gae Gallop MD   . ESOPHAGOGASTRODUODENOSCOPY (EGD) WITH PROPOFOL N/A 05/12/2016   Procedure: ESOPHAGOGASTRODUODENOSCOPY (EGD) WITH PROPOFOL;  Surgeon: Lollie Sails, MD;  Location: [facility] ENDOSCOPY;  Service: Endoscopy;  Laterality: N/A;  . ESOPHAGOGASTRODUODENOSCOPY (EGD) WITH PROPOFOL N/A 08/12/2020   Procedure: ESOPHAGOGASTRODUODENOSCOPY (EGD) WITH PROPOFOL;  Surgeon: [name], MD;  Location: [facility] ENDOSCOPY;  Service: Endoscopy;  Laterality: N/A;    OB History    Gravida  0   Para  0   Term  0   Preterm  0   AB  0   Living  0     SAB  0   IAB  0   Ectopic  0   Multiple  0   Live Births  0            Home Medications    Prior to Admission medications  Medication Sig Start Date End Date Taking? Authorizing Provider  BIOTIN PO Take 1 tablet by mouth daily.    [provider]  Calcium-Magnesium-Vitamin D (CALCIUM 500 PO) Take by mouth.    [provider]  cephALEXin (KEFLEX) 500 MG capsule Take 1 capsule (500 mg total) by mouth 4 (four) times daily. 11/26/20   Loura Halt A, NP  cetirizine (ZYRTEC) 10 MG tablet Take 10 mg by mouth daily.    [provider]  diclofenac (VOLTAREN) 75 MG EC tablet  11/13/16   [provider]  famotidine (PEPCID) 20 MG tablet Take 1 tablet (20 mg total) by mouth 2 (two) times daily. 05/10/20   [name], MD  glucose blood test strip Dispense based on patient preference and insurance preference, Use as once daily Dx: E11.9 01/09/20   Leone Haven,  MD  JARDIANCE 10 MG TABS tablet TAKE 1 TABLET BY MOUTH DAILY 11/22/20   Leone Haven, MD  Lancet Devices (ONE TOUCH DELICA LANCING DEV) MISC Use as directed Dx: E11.9 09/29/15   Rubbie Battiest, RN  metFORMIN (GLUCOPHAGE-XR) 500 MG 24 hr tablet TAKE 2 TABLETS (=1,000MG   TOTAL) 2 TIMES DAILY WITH  MEALS 06/03/20   Leone Haven, MD  Multiple Vitamin (MULTIVITAMIN) capsule Take 1 capsule by mouth daily.    [provider]  progesterone (PROMETRIUM) 100 MG capsule Take 1 capsule (100 mg total) by mouth daily. 04/09/52   Copland, Deirdre Evener, PA-C  Sod Picosulfate-Mag Ox-Cit Acd (CLENPIQ) 10-3.5-12 MG-GM -GM/160ML SOLN Take 1 kit by mouth as directed. 07/28/20   [name], MD  traMADol (ULTRAM) 50 MG tablet Take 50 mg by mouth every 6 (six) hours as needed.    [provider]  vitamin B-12 (CYANOCOBALAMIN) 100 MCG tablet Take 100 mcg by mouth daily.    [provider]  pantoprazole (PROTONIX) 40 MG tablet Take 1 tablet (40 mg total) by mouth 2 (two) times daily. **PLEASE SCHEDULE FOLLOW UP APPT** 12/30/19 04/06/20  [name], MD    Family History Family History  Problem Relation Age of Onset  . Hypertension Brother   . Breast cancer Mother        twice--30s and 72 (bilat breasts)  . Cancer Sister 48       Blood cancer (unknown)  . Cancer Maternal Aunt        Lung  . Kidney cancer Maternal Uncle 62  . Heart attack Paternal Uncle   . Aneurysm Paternal Grandmother   . Prostate cancer Maternal Uncle 79       needed seed tx  . Prostate cancer Cousin 60  . Diabetes Neg Hx     Social History Social History   Tobacco Use  . Smoking status: Never Smoker  . Smokeless tobacco: Never Used  Vaping Use  . Vaping Use: Never used  Substance Use Topics  . Alcohol use: No    Alcohol/week: 0.0 standard drinks  . Drug use: No     Allergies   Clarithromycin and Vicodin [hydrocodone-acetaminophen]   Review of Systems Review of Systems   Physical  Exam Triage Vital Signs ED Triage Vitals  Enc Vitals Group     BP 11/26/20 0834 (!) 145/83     Pulse Rate 11/26/20 0834 79     Resp 11/26/20 0834 18     Temp 11/26/20 0834 98.2 F (36.8 C)     Temp Source 11/26/20 0834 Oral     SpO2 11/26/20 0834 95 %  Weight 11/26/20 0836 227 lb 15.3 oz (103.4 kg)     Height 11/26/20 0836 _0  (1.626 m)     Head Circumference --      Peak Flow --      Pain Score 11/26/20 0836 8     Pain Loc --      Pain Edu? --      Excl. in [place]? --    No data found.  Updated Vital Signs BP (!) 145/83   Pulse 79   Temp 98.2 F (36.8 C) (Oral)   Resp 18   Ht _1  (1.626 m)   Wt 227 lb 15.3 oz (103.4 kg)   LMP  (LMP Unknown) Comment: Patient states she had her "uterus scraped" sometime in the past 10 years.   SpO2 95%   BMI 39.13 kg/m   Visual Acuity Right Eye Distance:   Left Eye Distance:   Bilateral Distance:    Right Eye Near:   Left Eye Near:    Bilateral Near:     Physical Exam Vitals and nursing note reviewed.  Constitutional:      General: She is not in acute distress.    Appearance: Normal appearance. She is not ill-appearing, toxic-appearing or diaphoretic.  HENT:     Head: Normocephalic.     Nose: Nose normal.     Mouth/Throat:     Pharynx: Oropharynx is clear.  Eyes:     Conjunctiva/sclera: Conjunctivae normal.  Neck:      Comments: Approximated 1 to 2 cm raised area with tenderness.  Not fluctuant.  No drainage. Pulmonary:     Effort: Pulmonary effort is normal.  Musculoskeletal:        General: Normal range of motion.     Cervical back: Normal range of motion. No spinous process tenderness. Normal range of motion.  Skin:    General: Skin is warm and dry.     Findings: No rash.  Neurological:     Mental Status: She is alert.  Psychiatric:        Mood and Affect: Mood normal.      UC Treatments / Results  Labs (all labs ordered are listed, but only abnormal results are displayed) Labs Reviewed - No data to  display  EKG   Radiology No results found.  Procedures Procedures (including critical care time)  Medications Ordered in UC Medications - No data to display  Initial Impression / Assessment and Plan / UC Course  I have reviewed the triage vital signs and the nursing notes.  Pertinent labs & imaging results that were available during my care of the patient were reviewed by me and considered in my medical decision making (see chart for details).     Abscess Most likely infected cyst. We will cover with antibiotics at this time.  Keflex as prescribed.  Tylenol or ibuprofen for pain as needed. Follow up as needed for continued or worsening symptoms  Final Clinical Impressions(s) / UC Diagnoses   Final diagnoses:  Abscess     Discharge Instructions     Antibiotics as prescribed.  You can take ibuprofen or Tylenol for pain as needed. Follow up as needed for continued or worsening symptoms     ED Prescriptions    Medication Sig Dispense Auth. Provider   cephALEXin (KEFLEX) 500 MG capsule Take 1 capsule (500 mg total) by mouth 4 (four) times daily. 28 capsule Chrishun Scheer A, NP     PDMP not reviewed this encounter.  Orvan July, NP 11/26/20 [phone]

## 2020-11-26 NOTE — ED Triage Notes (Signed)
Pt reports having a "knot on back of neck". The knot has been on back of neck x2weeks. Area is red and raised.

## 2020-11-29 ENCOUNTER — Other Ambulatory Visit: Payer: Self-pay | Admitting: Family Medicine

## 2020-12-10 ENCOUNTER — Other Ambulatory Visit: Payer: Self-pay | Admitting: Family Medicine

## 2020-12-10 ENCOUNTER — Other Ambulatory Visit: Payer: Self-pay | Admitting: Gastroenterology

## 2020-12-29 DIAGNOSIS — Z20822 Contact with and (suspected) exposure to covid-19: Secondary | ICD-10-CM | POA: Diagnosis not present

## 2020-12-29 DIAGNOSIS — Z03818 Encounter for observation for suspected exposure to other biological agents ruled out: Secondary | ICD-10-CM | POA: Diagnosis not present

## 2021-01-07 DIAGNOSIS — Z7984 Long term (current) use of oral hypoglycemic drugs: Secondary | ICD-10-CM | POA: Diagnosis not present

## 2021-01-07 DIAGNOSIS — E119 Type 2 diabetes mellitus without complications: Secondary | ICD-10-CM | POA: Diagnosis not present

## 2021-01-07 DIAGNOSIS — H1131 Conjunctival hemorrhage, right eye: Secondary | ICD-10-CM | POA: Diagnosis not present

## 2021-01-07 DIAGNOSIS — H2513 Age-related nuclear cataract, bilateral: Secondary | ICD-10-CM | POA: Diagnosis not present

## 2021-01-07 LAB — HM DIABETES EYE EXAM

## 2021-01-25 ENCOUNTER — Ambulatory Visit: Payer: BC Managed Care – PPO | Admitting: Obstetrics and Gynecology

## 2021-01-28 ENCOUNTER — Other Ambulatory Visit: Payer: Self-pay

## 2021-01-28 ENCOUNTER — Encounter: Payer: Self-pay | Admitting: Obstetrics and Gynecology

## 2021-01-28 ENCOUNTER — Encounter: Payer: Self-pay | Admitting: Family Medicine

## 2021-01-28 ENCOUNTER — Ambulatory Visit (INDEPENDENT_AMBULATORY_CARE_PROVIDER_SITE_OTHER): Payer: BC Managed Care – PPO | Admitting: Obstetrics and Gynecology

## 2021-01-28 ENCOUNTER — Ambulatory Visit (INDEPENDENT_AMBULATORY_CARE_PROVIDER_SITE_OTHER): Payer: BC Managed Care – PPO | Admitting: Family Medicine

## 2021-01-28 VITALS — BP 130/80 | Ht 64.0 in | Wt 232.0 lb

## 2021-01-28 VITALS — BP 118/80 | HR 82 | Temp 97.7°F | Ht 64.0 in | Wt 231.0 lb

## 2021-01-28 DIAGNOSIS — G8929 Other chronic pain: Secondary | ICD-10-CM

## 2021-01-28 DIAGNOSIS — N898 Other specified noninflammatory disorders of vagina: Secondary | ICD-10-CM | POA: Diagnosis not present

## 2021-01-28 DIAGNOSIS — J3489 Other specified disorders of nose and nasal sinuses: Secondary | ICD-10-CM | POA: Diagnosis not present

## 2021-01-28 DIAGNOSIS — Z1239 Encounter for other screening for malignant neoplasm of breast: Secondary | ICD-10-CM

## 2021-01-28 DIAGNOSIS — Z0000 Encounter for general adult medical examination without abnormal findings: Secondary | ICD-10-CM

## 2021-01-28 DIAGNOSIS — Z01419 Encounter for gynecological examination (general) (routine) without abnormal findings: Secondary | ICD-10-CM

## 2021-01-28 DIAGNOSIS — G4733 Obstructive sleep apnea (adult) (pediatric): Secondary | ICD-10-CM

## 2021-01-28 DIAGNOSIS — E119 Type 2 diabetes mellitus without complications: Secondary | ICD-10-CM

## 2021-01-28 DIAGNOSIS — Z566 Other physical and mental strain related to work: Secondary | ICD-10-CM

## 2021-01-28 DIAGNOSIS — N951 Menopausal and female climacteric states: Secondary | ICD-10-CM

## 2021-01-28 DIAGNOSIS — M5441 Lumbago with sciatica, right side: Secondary | ICD-10-CM | POA: Diagnosis not present

## 2021-01-28 DIAGNOSIS — R350 Frequency of micturition: Secondary | ICD-10-CM

## 2021-01-28 LAB — LIPID PANEL
Cholesterol: 124 mg/dL (ref 0–200)
HDL: 55.5 mg/dL (ref >39.00)
LDL Cholesterol: 53 mg/dL (ref 0–99)
NonHDL: 68.95
Total CHOL/HDL Ratio: 2
Triglycerides: 78 mg/dL (ref 0.0–149.0)
VLDL: 15.6 mg/dL (ref 0.0–40.0)

## 2021-01-28 LAB — COMPREHENSIVE METABOLIC PANEL
ALT: 17 U/L (ref 0–35)
AST: 18 U/L (ref 0–37)
Albumin: 4.1 g/dL (ref 3.5–5.2)
Alkaline Phosphatase: 66 U/L (ref 39–117)
BUN: 17 mg/dL (ref 6–23)
CO2: 30 mEq/L (ref 19–32)
Calcium: 9.7 mg/dL (ref 8.4–10.5)
Chloride: 101 mEq/L (ref 96–112)
Creatinine, Ser: 0.75 mg/dL (ref 0.40–1.20)
GFR: 86.22 mL/min (ref >60.00)
Glucose, Bld: 138 mg/dL — ABNORMAL HIGH (ref 70–99)
Potassium: 3.9 mEq/L (ref 3.5–5.1)
Sodium: 140 mEq/L (ref 135–145)
Total Bilirubin: 0.7 mg/dL (ref 0.2–1.2)
Total Protein: 7 g/dL (ref 6.0–8.3)

## 2021-01-28 LAB — MICROALBUMIN / CREATININE URINE RATIO
Creatinine,U: 161 mg/dL
Microalb Creat Ratio: 1.1 mg/g (ref 0.0–30.0)
Microalb, Ur: 1.8 mg/dL (ref 0.0–1.9)

## 2021-01-28 MED ORDER — PROGESTERONE MICRONIZED 100 MG PO CAPS: 100.0000 mg | ORAL_CAPSULE | Freq: Every day | ORAL | 3 refills | Status: DC

## 2021-01-28 MED ORDER — AZELASTINE HCL 0.1 % NA SOLN: 1.0000 | Freq: Two times a day (BID) | NASAL | 12 refills | Status: DC

## 2021-01-28 MED ORDER — LORATADINE 10 MG PO TABS: 10.0000 mg | ORAL_TABLET | Freq: Every day | ORAL | 11 refills | Status: DC

## 2021-01-28 NOTE — Progress Notes (Signed)
[name], MD Phone: [phone]  Brittany Santos is a 60 y.o. female who presents today for f/u.  DIABETES Disease Monitoring: Blood Sugar ranges-generally 120-140 Polyuria/phagia/dipsia- chronic polydipsia      Optho- saw a couple weeks ago Medications: Compliance- taking jardiance, metformin Hypoglycemic symptoms-she notes she felt a little dizzy earlier today as she had not eaten anything this morning.  Occasionally feels that way when she does not eat.  She is unsure if her sugar is actually low at that time.  OSA: Using her CPAP nightly for at least 6 to 8 hours.  Some hypersomnia though her sleep is disrupted by back issues.  She generally wakes up well rested.  Chronic back pain: This is in the right back.  Does occasionally get sciatica down the back of the right leg.  She sees sports medicine [facility] and got an injection in November.  It did not last as long as it typically last for her.  She is also on diclofenac and tramadol.  Stress: This is related to work and not having any time for herself.  She works 12-hour days.  She denies depression.  She wonders if she is going to be able to work until age 39.  Chronic sinus drainage: This has been an ongoing issue.  She has seen ENT for this.  She has been using Zyrtec and Flonase though continues to have drainage.   Social History   Tobacco Use  Smoking Status Never Smoker  Smokeless Tobacco Never Used    Current Outpatient Medications on File Prior to Visit  Medication Sig Dispense Refill  . BIOTIN PO Take 1 tablet by mouth daily.    . Calcium-Magnesium-Vitamin D (CALCIUM 500 PO) Take by mouth.    . diclofenac (VOLTAREN) 75 MG EC tablet     . famotidine (PEPCID) 20 MG tablet TAKE 1 TABLET BY MOUTH TWICE DAILY 60 tablet 6  . glucose blood test strip Dispense based on patient preference and insurance preference, Use as once daily Dx: E11.9 100 each 12  . JARDIANCE 10 MG TABS tablet TAKE 1 TABLET BY MOUTH DAILY 30  tablet 4  . Lancet Devices (ONE TOUCH DELICA LANCING DEV) MISC Use as directed Dx: E11.9 1 each 0  . metFORMIN (GLUCOPHAGE-XR) 500 MG 24 hr tablet TAKE 2 TABLETS (=1,$RemoveBefor'000MG'[id number]$    TOTAL) 2 TIMES DAILY WITH  MEALS 360 tablet 1  . Multiple Vitamin (MULTIVITAMIN) capsule Take 1 capsule by mouth daily.    . progesterone (PROMETRIUM) 100 MG capsule Take 1 capsule (100 mg total) by mouth daily. 90 capsule 3  . Sod Picosulfate-Mag Ox-Cit Acd (CLENPIQ) 10-3.5-12 MG-GM -GM/160ML SOLN Take 1 kit by mouth as directed. 320 mL 0  . traMADol (ULTRAM) 50 MG tablet Take 50 mg by mouth every 6 (six) hours as needed.    . vitamin B-12 (CYANOCOBALAMIN) 100 MCG tablet Take 100 mcg by mouth daily.    . [DISCONTINUED] pantoprazole (PROTONIX) 40 MG tablet Take 1 tablet (40 mg total) by mouth 2 (two) times daily. **PLEASE SCHEDULE FOLLOW UP APPT** 60 tablet 3   No current facility-administered medications on file prior to visit.     ROS see history of present illness  Objective  Physical Exam Vitals:   01/28/21 0807  BP: 118/80  Pulse: 82  Temp: 97.7 F (36.5 C)  SpO2: 98%    BP Readings from Last 3 Encounters:  01/28/21 130/80  01/28/21 118/80  11/26/20 (!) 145/83   Wt Readings from Last  3 Encounters:  01/28/21 232 lb (105.2 kg)  01/28/21 231 lb (104.8 kg)  11/26/20 227 lb 15.3 oz (103.4 kg)    Physical Exam Constitutional:      General: She is not in acute distress.    Appearance: She is not diaphoretic.  HENT:     Head: Normocephalic and atraumatic.  Cardiovascular:     Rate and Rhythm: Normal rate and regular rhythm.     Heart sounds: Normal heart sounds.  Pulmonary:     Effort: Pulmonary effort is normal.     Breath sounds: Normal breath sounds.  Musculoskeletal:        General: No edema.     Comments: No midline spine tenderness, no midline spine step-off, there is right lumbar back muscular tenderness with no overlying skin changes  Skin:    General: Skin is warm and dry.   Neurological:     Mental Status: She is alert.     Comments: 5/5 strength bilateral quads, hamstrings, plantar flexion, and dorsiflexion, sensation light touch intact bilateral lower extremities      Assessment/Plan: Please see individual problem list.  Problem List Items Addressed This Visit    Back pain, chronic    Chronic issue.  I encouraged her to follow-up with her sports medicine doctor to see if another injection would be beneficial.  The patient also mentioned the possibility of disability and I advised her to discuss that with her specialist.      OSA (obstructive sleep apnea) - Primary    Patient reports compliance with her CPAP.  We will request a compliance report and review to ensure that is working adequately.  She appears to continue to benefit from this.      Sinus drainage    Chronic ongoing issues with this.  She can continue Flonase.  She will switch to the Claritin.  We will trial Astelin.        Relevant Medications   azelastine (ASTELIN) 0.1 % nasal spray   loratadine (CLARITIN) 10 MG tablet   Stress at work    Patient continues to have stress.  I offered support.  Encouraged her to try to do some things for herself.      Type 2 diabetes mellitus ([facility])    Seems to be generally well controlled.  We we will check an A1c.  Her in office glucose check was 147.  She will continue Jardiance and Metformin.  We will request ophthalmology records.  She will monitor for hypoglycemic symptoms and try to do a better job eating to prevent these.      Relevant Orders   Comp Met (CMET)   Lipid panel   HgB A1c   Urine Microalbumin w/creat. ratio    Other Visit Diagnoses    Urine frequency       Relevant Orders   POCT Urinalysis Dipstick      This visit occurred during the SARS-CoV-2 public health emergency.  Safety protocols were in place, including screening questions prior to the visit, additional usage of staff PPE, and extensive cleaning of exam room while  observing appropriate contact time as indicated for disinfecting solutions.   [name], MD [place]

## 2021-01-28 NOTE — Patient Instructions (Signed)
Nice to see you. We will get labs and a urine test on you today. Please try the Astelin nasal spray and Claritin for your chronic allergy symptoms. Please contact your back specialist regarding your back. If you would like to consider the Cymbalta please let me know. I have included some information below on this medication.  Duloxetine Delayed-Release Capsules What is this medicine? DULOXETINE (doo LOX e teen) is used to treat depression, anxiety, and different types of chronic pain. This medicine may be used for other purposes; ask your health care provider or pharmacist if you have questions. COMMON BRAND NAME(S): Cymbalta, Creig Hines, Irenka What should I tell my health care provider before I take this medicine? They need to know if you have any of these conditions:  bipolar disorder  glaucoma  high blood pressure  kidney disease  liver disease  seizures  suicidal thoughts, plans or attempt; a previous suicide attempt by you or a family member  take medicines that treat or prevent blood clots  taken medicines called MAOIs like Carbex, Eldepryl, Marplan, Nardil, and Parnate within 14 days  trouble passing urine  an unusual reaction to duloxetine, other medicines, foods, dyes, or preservatives  pregnant or trying to get pregnant  breast-feeding How should I use this medicine? Take this medicine by mouth with a glass of water. Follow the directions on the prescription label. Do not crush, cut or chew some capsules of this medicine. Some capsules may be opened and sprinkled on applesauce. Check with your doctor or pharmacist if you are not sure. You can take this medicine with or without food. Take your medicine at regular intervals. Do not take your medicine more often than directed. Do not stop taking this medicine suddenly except upon the advice of your doctor. Stopping this medicine too quickly may cause serious side effects or your condition may worsen. A special MedGuide  will be given to you by the pharmacist with each prescription and refill. Be sure to read this information carefully each time. Talk to your pediatrician regarding the use of this medicine in children. While this drug may be prescribed for children as young as 15 years of age for selected conditions, precautions do apply. Overdosage: If you think you have taken too much of this medicine contact a poison control center or emergency room at once. NOTE: This medicine is only for you. Do not share this medicine with others. What if I miss a dose? If you miss a dose, take it as soon as you can. If it is almost time for your next dose, take only that dose. Do not take double or extra doses. What may interact with this medicine? Do not take this medicine with any of the following medications:  desvenlafaxine  levomilnacipran  linezolid  MAOIs like Carbex, Eldepryl, Emsam, Marplan, Nardil, and Parnate  methylene blue (injected into a vein)  milnacipran  safinamide  thioridazine  venlafaxine  viloxazine This medicine may also interact with the following medications:  alcohol  amphetamines  aspirin and aspirin-like medicines  certain antibiotics like ciprofloxacin and enoxacin  certain medicines for blood pressure, heart disease, irregular heart beat  certain medicines for depression, anxiety, or psychotic disturbances  certain medicines for migraine headache like almotriptan, eletriptan, frovatriptan, naratriptan, rizatriptan, sumatriptan, zolmitriptan  certain medicines that treat or prevent blood clots like warfarin, enoxaparin, and dalteparin  cimetidine  fentanyl  lithium  NSAIDS, medicines for pain and inflammation, like ibuprofen or naproxen  phentermine  procarbazine  rasagiline  sibutramine  St. John's wort  theophylline  tramadol  tryptophan This list may not describe all possible interactions. Give your health care provider a list of all the  medicines, herbs, non-prescription drugs, or dietary supplements you use. Also tell them if you smoke, drink alcohol, or use illegal drugs. Some items may interact with your medicine. What should I watch for while using this medicine? Tell your doctor if your symptoms do not get better or if they get worse. Visit your doctor or healthcare provider for regular checks on your progress. Because it may take several weeks to see the full effects of this medicine, it is important to continue your treatment as prescribed by your doctor. This medicine may cause serious skin reactions. They can happen weeks to months after starting the medicine. Contact your healthcare provider right away if you notice fevers or flu-like symptoms with a rash. The rash may be red or purple and then turn into blisters or peeling of the skin. Or, you might notice a red rash with swelling of the face, lips, or lymph nodes in your neck or under your arms. Patients and their families should watch out for new or worsening thoughts of suicide or depression. Also watch out for sudden changes in feelings such as feeling anxious, agitated, panicky, irritable, hostile, aggressive, impulsive, severely restless, overly excited and hyperactive, or not being able to sleep. If this happens, especially at the beginning of treatment or after a change in dose, call your healthcare provider. You may get drowsy or dizzy. Do not drive, use machinery, or do anything that needs mental alertness until you know how this medicine affects you. Do not stand or sit up quickly, especially if you are an older patient. This reduces the risk of dizzy or fainting spells. Alcohol may interfere with the effect of this medicine. Avoid alcoholic drinks. This medicine can cause an increase in blood pressure. This medicine can also cause a sudden drop in your blood pressure, which may make you feel faint and increase the chance of a fall. These effects are most common when you  first start the medicine or when the dose is increased, or during use of other medicines that can cause a sudden drop in blood pressure. Check with your doctor for instructions on monitoring your blood pressure while taking this medicine. Your mouth may get dry. Chewing sugarless gum or sucking hard candy, and drinking plenty of water, may help. Contact your doctor if the problem does not go away or is severe. What side effects may I notice from receiving this medicine? Side effects that you should report to your doctor or health care professional as soon as possible:  allergic reactions like skin rash, itching or hives, swelling of the face, lips, or tongue  anxious  breathing problems  confusion  changes in vision  chest pain  confusion  elevated mood, decreased need for sleep, racing thoughts, impulsive behavior  eye pain  fast, irregular heartbeat  feeling faint or lightheaded, falls  feeling agitated, angry, or irritable  hallucination, loss of contact with reality  high blood pressure  loss of balance or coordination  palpitations  redness, blistering, peeling or loosening of the skin, including inside the mouth  restlessness, pacing, inability to keep still  seizures  stiff muscles  suicidal thoughts or other mood changes  trouble passing urine or change in the amount of urine  trouble sleeping  unusual bleeding or bruising  unusually weak or tired  vomiting  yellowing of the eyes or skin Side effects that usually do not require medical attention (report to your doctor or health care professional if they continue or are bothersome):  change in sex drive or performance  change in appetite or weight  constipation  dizziness  dry mouth  headache  increased sweating  nausea  tired This list may not describe all possible side effects. Call your doctor for medical advice about side effects. You may report side effects to FDA at  1-800-FDA-1088. Where should I keep my medicine? Keep out of the reach of children and pets. Store at room temperature between 15 and 30 degrees C (59 to 86 degrees F). Get rid of any unused medicine after the expiration date. To get rid of medicines that are no longer needed or have expired:  Take the medicine to a medicine take-back program. Check with your pharmacy or law enforcement to find a location.  If you cannot return the medicine, check the label or package insert to see if the medicine should be thrown out in the garbage or flushed down the toilet. If you are not sure, ask your health care provider. If it is safe to put it in the trash, take the medicine out of the container. Mix the medicine with cat litter, dirt, coffee grounds, or other unwanted substance. Seal the mixture in a bag or container. Put it in the trash. NOTE: This sheet is a summary. It may not cover all possible information. If you have questions about this medicine, talk to your doctor, pharmacist, or health care provider.  2021 Elsevier/Gold Standard (2020-10-21 16:06:16)

## 2021-01-28 NOTE — Progress Notes (Signed)
Gynecology Annual Exam  PCP: Leone Haven, MD  Chief Complaint:  Chief Complaint  Patient presents with  . Gynecologic Exam    Vag discharge, occasional itching x couple of months    History of Present Illness:Patient is a 60 y.o. G0P0000 presents for annual exam. The patient reports intermittent vaginal irritation. Patient denies change in discharge. Patient reports completing an antibiotic course in October, 2021, and is unsure if she might have a yeast infection. Patient denies additional concerns today.  LMP: No LMP recorded (lmp unknown). Patient is postmenopausal. Intermenstrual Bleeding: no Postcoital Bleeding: no Dysmenorrhea: no  The patient is not sexually active. She denies dyspareunia.  The patient does perform self breast exams.  There is notable family history of breast or ovarian cancer in her family. Mother had breast cancer twice.  The patient wears seatbelts: yes.   The patient has regular exercise: no.    The patient denies current symptoms of depression.     Review of Systems: Review of Systems  Constitutional: Negative.   HENT: Negative.   Eyes: Negative.   Respiratory: Negative.   Cardiovascular: Negative.   Gastrointestinal: Negative.   Genitourinary:       Vaginal irritation and occasional burning  Musculoskeletal: Negative.   Skin: Negative.   Neurological: Negative.   Endo/Heme/Allergies: Negative.   Psychiatric/Behavioral: Negative.     Past Medical History:  Patient Active Problem List   Diagnosis Date Noted  . Acute gastritis without hemorrhage   . Personal history of colonic polyps   . Stress at work 06/22/2020  . Obesity 02/19/2020  . Right shoulder pain 02/16/2020  . Family history of breast cancer 01/15/2020  . Vasomotor symptoms due to menopause 01/15/2020  . Sinus drainage 01/09/2020  . Tonsil pain 08/27/2019  . Bilateral leg edema 04/26/2018  . OSA (obstructive sleep apnea) 04/26/2018  . Foot callus 04/26/2018  .  Back pain, chronic 07/06/2016  . Skin lesion 06/19/2016  . IBS (irritable bowel syndrome) 12/21/2015  . GERD (gastroesophageal reflux disease) 04/21/2015  . Type 2 diabetes mellitus ([place]) 01/20/2015    Past Surgical History:  Past Surgical History:  Procedure Laterality Date  . BILATERAL CARPAL TUNNEL RELEASE    . BREAST EXCISIONAL BIOPSY Bilateral    benign  . BREAST SURGERY    . cervical neck fusion    . CHOLECYSTECTOMY    . COLONOSCOPY  2017   Dr. Gustavo Lah with polyp; repeat due 2021  . COLONOSCOPY WITH PROPOFOL N/A 08/12/2020   Procedure: COLONOSCOPY WITH PROPOFOL;  Surgeon: [name], MD;  Location: [facility] ENDOSCOPY;  Service: Endoscopy;  Laterality: N/A;  . ENDOVENOUS ABLATION SAPHENOUS VEIN W/ LASER Left 04/15/2020   endovenous laser ablation left greater saphenous vein by Gae Gallop MD   . ENDOVENOUS ABLATION SAPHENOUS VEIN W/ LASER Left 10/21/2020   endovenous laser ablation left small saphenous vein by Gae Gallop MD   . ESOPHAGOGASTRODUODENOSCOPY (EGD) WITH PROPOFOL N/A 05/12/2016   Procedure: ESOPHAGOGASTRODUODENOSCOPY (EGD) WITH PROPOFOL;  Surgeon: Lollie Sails, MD;  Location: [facility] ENDOSCOPY;  Service: Endoscopy;  Laterality: N/A;  . ESOPHAGOGASTRODUODENOSCOPY (EGD) WITH PROPOFOL N/A 08/12/2020   Procedure: ESOPHAGOGASTRODUODENOSCOPY (EGD) WITH PROPOFOL;  Surgeon: [name], MD;  Location: [facility] ENDOSCOPY;  Service: Endoscopy;  Laterality: N/A;    Gynecologic History:  No LMP recorded (lmp unknown). Patient is postmenopausal. Last Pap: 09/28/2017 Results were: NILM/HPV neg Last mammogram: 09/22/21 Results were: BI-RAD II  Obstetric History: G0P0000  Family History:  Family History  Problem  Relation Age of Onset  . Hypertension Brother   . Breast cancer Mother        twice--30s and 52 (bilat breasts)  . Cancer Sister 55       Blood cancer (unknown)  . Cancer Maternal Aunt        Lung  . Kidney cancer Maternal Uncle 66  . Heart attack Paternal Uncle    . Aneurysm Paternal Grandmother   . Prostate cancer Maternal Uncle 79       needed seed tx  . Prostate cancer Cousin 51  . Diabetes Neg Hx     Social History:  Social History   Socioeconomic History  . Marital status: Single    Spouse name: Not on file  . Number of children: Not on file  . Years of education: Not on file  . Highest education level: Not on file  Occupational History  . Not on file  Tobacco Use  . Smoking status: Never Smoker  . Smokeless tobacco: Never Used  Vaping Use  . Vaping Use: Never used  Substance and Sexual Activity  . Alcohol use: No    Alcohol/week: 0.0 standard drinks  . Drug use: No  . Sexual activity: Not Currently    Birth control/protection: Post-menopausal  Other Topics Concern  . Not on file  Social History Narrative   Works- [employer]    Mother lives with pt   No children    1 dog lives inside    Right handed    Caffeine- 1 piece chocolate occasionally    Associate degree   Enjoys- gardening and going to concerts    Social Determinants of Radio broadcast assistant Strain: Not on file  Food Insecurity: Not on file  Transportation Needs: Not on file  Physical Activity: Not on file  Stress: Not on file  Social Connections: Not on file  Intimate Partner Violence: Not on file    Allergies:  Allergies  Allergen Reactions  . Clarithromycin     Bad taste in mouth and made her feel nauseated   . Vicodin [Hydrocodone-Acetaminophen] Itching and Rash    Medications: Prior to Admission medications   Medication Sig Start Date End Date Taking? Authorizing Provider  azelastine (ASTELIN) 0.1 % nasal spray Place 1 spray into both nostrils 2 (two) times daily. Use in each nostril as directed 01/28/21  Yes Leone Haven, MD  BIOTIN PO Take 1 tablet by mouth daily.   Yes [provider]  Calcium-Magnesium-Vitamin D (CALCIUM 500 PO) Take by mouth.   Yes [provider]  diclofenac (VOLTAREN) 75 MG EC  tablet  11/13/16  Yes [provider]  famotidine (PEPCID) 20 MG tablet TAKE 1 TABLET BY MOUTH TWICE DAILY 12/23/20  Yes [name], MD  glucose blood test strip Dispense based on patient preference and insurance preference, Use as once daily Dx: E11.9 01/09/20  Yes Leone Haven, MD  JARDIANCE 10 MG TABS tablet TAKE 1 TABLET BY MOUTH DAILY 11/22/20  Yes Leone Haven, MD  Lancet Devices (ONE TOUCH DELICA LANCING DEV) MISC Use as directed Dx: E11.9 09/29/15  Yes Doss, Velora Heckler, RN  loratadine (CLARITIN) 10 MG tablet Take 1 tablet (10 mg total) by mouth daily. 01/28/21  Yes Leone Haven, MD  metFORMIN (GLUCOPHAGE-XR) 500 MG 24 hr tablet TAKE 2 TABLETS (=1,$RemoveBefor'000MG'YFnbigqkgpgJ$    TOTAL) 2 TIMES DAILY WITH  MEALS 11/29/20  Yes Leone Haven, MD  Multiple Vitamin (MULTIVITAMIN) capsule Take 1  capsule by mouth daily.   Yes [provider]  progesterone (PROMETRIUM) 100 MG capsule Take 1 capsule (100 mg total) by mouth daily. 9/98/33  Yes Copland, Deirdre Evener, PA-C  Sod Picosulfate-Mag Ox-Cit Acd (CLENPIQ) 10-3.5-12 MG-GM -GM/160ML SOLN Take 1 kit by mouth as directed. 07/28/20  Yes [name], MD  traMADol (ULTRAM) 50 MG tablet Take 50 mg by mouth every 6 (six) hours as needed.   Yes [provider]  vitamin B-12 (CYANOCOBALAMIN) 100 MCG tablet Take 100 mcg by mouth daily.   Yes [provider]  pantoprazole (PROTONIX) 40 MG tablet Take 1 tablet (40 mg total) by mouth 2 (two) times daily. **PLEASE SCHEDULE FOLLOW UP APPT** 12/30/19 04/06/20  [name], MD    Physical Exam Vitals: Blood pressure 130/80, height $RemoveBefore'5\' 4"'SjAmAtswyPSGQ$  (1.626 m), weight 232 lb (105.2 kg).  General: NAD HEENT: normocephalic, anicteric Thyroid: no enlargement, no palpable nodules Pulmonary: No increased work of breathing, CTAB Cardiovascular: RRR, distal pulses 2+ Breast: Breast symmetrical, no tenderness, no palpable nodules or masses, no skin or nipple retraction present, no nipple discharge.  No  axillary or supraclavicular lymphadenopathy. Abdomen: NABS, soft, non-tender, non-distended.  Umbilicus without lesions.  No hepatomegaly, splenomegaly or masses palpable. No evidence of hernia  Genitourinary:  External: Normal external female genitalia.  Normal urethral meatus, normal Bartholin's and Skene's glands.    Vagina: Normal vaginal mucosa, no evidence of prolapse.    Cervix: Grossly normal in appearance, no bleeding  Uterus: Non-enlarged, mobile, normal contour.  No CMT  Adnexa: ovaries non-enlarged, no adnexal masses  Rectal: deferred  Lymphatic: no evidence of inguinal lymphadenopathy Extremities: no edema, erythema, or tenderness Neurologic: Grossly intact Psychiatric: mood appropriate, affect full  Wet Prep: PH: 4.5 Clue Cells: Negative Fungal elements: Negative Trichomonas: Negative    Assessment: 60 y.o. G0P0000 routine annual exam  Plan: Problem List Items Addressed This Visit      Other   Vasomotor symptoms due to menopause   Relevant Medications   progesterone (PROMETRIUM) 100 MG capsule    Other Visit Diagnoses    Breast cancer screening, high risk patient    -  Primary   Relevant Orders   MR BREAST BILATERAL W WO CONTRAST INC CAD   Screening breast examination       Encounter for gynecological examination without abnormal finding       Routine health maintenance       Vaginal irritation          1) Mammogram - recommend yearly screening mammogram.  Mammogram Is up to date - patient with increased breast cancer risk related to family hx - IBIS - 26.6%. High-risk screening indicated. Discussed with patient. Breast MRI ordered today.  2) ASCCP guidelines and rational discussed.  Patient opts for every 5 years screening interval  4) Routine healthcare maintenance including cholesterol, diabetes screening discussed managed by PCP  5) Colonoscopy UTD.  Screening recommended starting at age 39 for average risk individuals, age 5 for individuals deemed  at increased risk (including African Americans) and recommended to continue until age 60.  For patient age 31-85 individualized approach is recommended.  Gold standard screening is via colonoscopy, Cologuard screening is an acceptable alternative for patient unwilling or unable to undergo colonoscopy.  "Colorectal cancer screening for average?risk adults: 2018 guideline update from the American Cancer Society"CA: A Cancer Journal for Clinicians: May 16, 2017   6) Vasomotor sx of menopause- currently controlled with progesterone rx - will continue  7) Return in  about 1 year (around 01/28/2022).    Orlie Pollen, CNM, MSN  [place] Pigeon, [facility] 01/28/2021, 11:48 AM

## 2021-01-28 NOTE — Assessment & Plan Note (Addendum)
Seems to be generally well controlled.  We we will check an A1c.  Her in office glucose check was 147.  She will continue Jardiance and Metformin.  We will request ophthalmology records.  She will monitor for hypoglycemic symptoms and try to do a better job eating to prevent these.

## 2021-01-28 NOTE — Assessment & Plan Note (Signed)
Chronic issue.  I encouraged her to follow-up with her sports medicine doctor to see if another injection would be beneficial.  The patient also mentioned the possibility of disability and I advised her to discuss that with her specialist.

## 2021-01-28 NOTE — Assessment & Plan Note (Signed)
Chronic ongoing issues with this.  She can continue Flonase.  She will switch to the Claritin.  We will trial Astelin.

## 2021-01-28 NOTE — Assessment & Plan Note (Signed)
Patient reports compliance with her CPAP.  We will request a compliance report and review to ensure that is working adequately.  She appears to continue to benefit from this.

## 2021-01-28 NOTE — Assessment & Plan Note (Signed)
Patient continues to have stress.  I offered support.  Encouraged her to try to do some things for herself.

## 2021-01-31 LAB — HEMOGLOBIN A1C: Hgb A1c MFr Bld: 6.4 % (ref 4.6–6.5)

## 2021-02-07 DIAGNOSIS — M7061 Trochanteric bursitis, right hip: Secondary | ICD-10-CM | POA: Diagnosis not present

## 2021-02-07 DIAGNOSIS — G5701 Lesion of sciatic nerve, right lower limb: Secondary | ICD-10-CM | POA: Diagnosis not present

## 2021-02-11 ENCOUNTER — Telehealth: Payer: Self-pay

## 2021-02-11 NOTE — Telephone Encounter (Signed)
Pt calling; got approval from [facility] for Breast MRI; is wondering why are we doing it now - b/c she had two mammograms in Oct of 2021; due for mammogram again in October this yr; Is this something that needs to be done now b/c something seen in those back then or can it wait til normal time in Oct?  [phone]

## 2021-02-14 NOTE — Telephone Encounter (Signed)
Called informed patient that she did not have to MRI today, she could have within five months of her next mammogram. Patient verbalized understanding. drl

## 2021-02-18 ENCOUNTER — Ambulatory Visit: Payer: BC Managed Care – PPO

## 2021-03-04 DIAGNOSIS — G4733 Obstructive sleep apnea (adult) (pediatric): Secondary | ICD-10-CM | POA: Diagnosis not present

## 2021-03-21 ENCOUNTER — Other Ambulatory Visit: Payer: Self-pay

## 2021-03-21 MED ORDER — PANTOPRAZOLE SODIUM 40 MG PO TBEC
40.0000 mg | DELAYED_RELEASE_TABLET | Freq: Two times a day (BID) | ORAL | 6 refills | Status: DC
Start: 2021-03-21 — End: 2021-09-16

## 2021-04-23 ENCOUNTER — Other Ambulatory Visit: Payer: Self-pay | Admitting: Family Medicine

## 2021-05-04 ENCOUNTER — Ambulatory Visit: Payer: BC Managed Care – PPO | Admitting: Family Medicine

## 2021-05-09 ENCOUNTER — Other Ambulatory Visit: Payer: Self-pay

## 2021-05-17 ENCOUNTER — Other Ambulatory Visit: Payer: Self-pay

## 2021-05-17 ENCOUNTER — Ambulatory Visit (INDEPENDENT_AMBULATORY_CARE_PROVIDER_SITE_OTHER): Payer: BC Managed Care – PPO | Admitting: Family Medicine

## 2021-05-17 ENCOUNTER — Encounter: Payer: Self-pay | Admitting: Family Medicine

## 2021-05-17 DIAGNOSIS — E119 Type 2 diabetes mellitus without complications: Secondary | ICD-10-CM

## 2021-05-17 DIAGNOSIS — J3489 Other specified disorders of nose and nasal sinuses: Secondary | ICD-10-CM

## 2021-05-17 DIAGNOSIS — S66912A Strain of unspecified muscle, fascia and tendon at wrist and hand level, left hand, initial encounter: Secondary | ICD-10-CM | POA: Insufficient documentation

## 2021-05-17 LAB — POCT GLYCOSYLATED HEMOGLOBIN (HGB A1C): Hemoglobin A1C: 6.6 % — AB (ref 4.0–5.6)

## 2021-05-17 MED ORDER — MONTELUKAST SODIUM 10 MG PO TABS
10.0000 mg | ORAL_TABLET | Freq: Every day | ORAL | 3 refills | Status: DC
Start: 2021-05-17 — End: 2022-03-06

## 2021-05-17 NOTE — Assessment & Plan Note (Signed)
Well-controlled.  She will continue Jardiance 10 mg once daily and metformin 1000 mg twice daily with meals.

## 2021-05-17 NOTE — Progress Notes (Signed)
[name], MD Phone: [phone]  Brittany Santos is a 60 y.o. female who presents today for f/u.  DIABETES Disease Monitoring: Blood Sugar ranges-130s-140s Polyuria/phagia/dipsia- no      Optho- UTD Medications: Compliance- taking jardiance, metformin Hypoglycemic symptoms- no  Left wrist pain: Patient notes this started on Sunday.  She lifted a pan and noted pain in the volar ulnar aspect of her wrist.  Notes it only hurts if she bends a certain way like picking something up.  She has been wearing a brace with some benefit.  There was no injury to the area.  Allergic rhinitis: Currently using Claritin and over-the-counter Flonase.  Also using allergy eyedrops.  She has persistent postnasal drip.  Also has itchy watery eyes.  Occasional cough from postnasal drip.  Some sneezing.  She has a history of using allergy shots.  She has tried multiple other antihistamines and nasal sprays with no benefit.  Social History   Tobacco Use  Smoking Status Never Smoker  Smokeless Tobacco Never Used    Current Outpatient Medications on File Prior to Visit  Medication Sig Dispense Refill  . azelastine (ASTELIN) 0.1 % nasal spray Place 1 spray into both nostrils 2 (two) times daily. Use in each nostril as directed 30 mL 12  . BIOTIN PO Take 1 tablet by mouth daily.    . Calcium-Magnesium-Vitamin D (CALCIUM 500 PO) Take by mouth.    . diclofenac (VOLTAREN) 75 MG EC tablet     . famotidine (PEPCID) 20 MG tablet TAKE 1 TABLET BY MOUTH TWICE DAILY 60 tablet 6  . glucose blood test strip Dispense based on patient preference and insurance preference, Use as once daily Dx: E11.9 100 each 12  . JARDIANCE 10 MG TABS tablet TAKE 1 TABLET BY MOUTH DAILY 30 tablet 4  . Lancet Devices (ONE TOUCH DELICA LANCING DEV) MISC Use as directed Dx: E11.9 1 each 0  . loratadine (CLARITIN) 10 MG tablet Take 1 tablet (10 mg total) by mouth daily. 30 tablet 11  . metFORMIN (GLUCOPHAGE-XR) 500 MG 24 hr tablet  TAKE 2 TABLETS (=1,000MG   TOTAL) 2 TIMES DAILY WITH  MEALS 360 tablet 1  . Multiple Vitamin (MULTIVITAMIN) capsule Take 1 capsule by mouth daily.    . pantoprazole (PROTONIX) 40 MG tablet Take 1 tablet (40 mg total) by mouth 2 (two) times daily. 60 tablet 6  . progesterone (PROMETRIUM) 100 MG capsule Take 1 capsule (100 mg total) by mouth daily. 90 capsule 3  . progesterone (PROMETRIUM) 100 MG capsule Take 1 capsule (100 mg total) by mouth daily. 90 capsule 3  . Sod Picosulfate-Mag Ox-Cit Acd (CLENPIQ) 10-3.5-12 MG-GM -GM/160ML SOLN Take 1 kit by mouth as directed. 320 mL 0  . traMADol (ULTRAM) 50 MG tablet Take 50 mg by mouth every 6 (six) hours as needed.    . vitamin B-12 (CYANOCOBALAMIN) 100 MCG tablet Take 100 mcg by mouth daily.     No current facility-administered medications on file prior to visit.     ROS see history of present illness  Objective  Physical Exam Vitals:   05/17/21 0801  BP: 130/80  Pulse: 96  Temp: (!) 97.3 F (36.3 C)  SpO2: 99%    BP Readings from Last 3 Encounters:  05/17/21 130/80  01/28/21 130/80  01/28/21 118/80   Wt Readings from Last 3 Encounters:  05/17/21 225 lb 12.8 oz (102.4 kg)  01/28/21 232 lb (105.2 kg)  01/28/21 231 lb (104.8 kg)  Physical Exam Constitutional:      General: She is not in acute distress.    Appearance: She is not diaphoretic.  Cardiovascular:     Rate and Rhythm: Normal rate and regular rhythm.     Heart sounds: Normal heart sounds.  Pulmonary:     Effort: Pulmonary effort is normal.     Breath sounds: Normal breath sounds.  Musculoskeletal:     Right lower leg: No edema.     Left lower leg: No edema.     Comments: Left wrist with no tenderness, warmth, erythema, or swelling, no discomfort on range of motion, she had discomfort in her volar ulnar aspect wrist with trying to hold a box of gloves   Skin:    General: Skin is warm and dry.  Neurological:     Mental Status: She is alert.    Diabetic Foot  Exam - Simple   Simple Foot Form Diabetic Foot exam was performed with the following findings: Yes 05/17/2021  8:26 AM  Visual Inspection No deformities, no ulcerations, no other skin breakdown bilaterally: Yes Sensation Testing Intact to touch and monofilament testing bilaterally: Yes Pulse Check Posterior Tibialis and Dorsalis pulse intact bilaterally: Yes Comments      Assessment/Plan: Please see individual problem list.  Problem List Items Addressed This Visit    Type 2 diabetes mellitus ([place])    Well-controlled.  She will continue Jardiance 10 mg once daily and metformin 1000 mg twice daily with meals.        Relevant Orders   POCT HgB A1C (Completed)   Sinus drainage    Chronic issue.  She will continue Claritin and Flonase.  We will add Singulair to see if that is beneficial.  She will monitor for side effects with this medication.      Relevant Medications   montelukast (SINGULAIR) 10 MG tablet   Strain of left wrist    I suspect this is a tendinous strain.  She will continue with her brace.  She will ice the area for 10 minutes 3 times daily.  Discussed she could use diclofenac as prescribed and she would need to take that with food.  If not improving over the next 2 weeks she will let me know.  If it worsens at any point she will contact us sooner.         Return in about 6 months (around 11/16/2021) for Diabetes.  This visit occurred during the SARS-CoV-2 public health emergency.  Safety protocols were in place, including screening questions prior to the visit, additional usage of staff PPE, and extensive cleaning of exam room while observing appropriate contact time as indicated for disinfecting solutions.    [name], MD [place]

## 2021-05-17 NOTE — Assessment & Plan Note (Signed)
I suspect this is a tendinous strain.  She will continue with her brace.  She will ice the area for 10 minutes 3 times daily.  Discussed she could use diclofenac as prescribed and she would need to take that with food.  If not improving over the next 2 weeks she will let me know.  If it worsens at any point she will contact us sooner.

## 2021-05-17 NOTE — Patient Instructions (Signed)
Nice to see you. Please try the Singulair.  If you notice any side effects please let us know. Please continue to wear the wrist brace.  You can ice your wrist for 10 minutes 3 times daily.  You can also take the diclofenac with food as prescribed to see if that will help with your pain.  If your pain worsens at any point or if its not improving over the next 2 weeks please let me know.

## 2021-05-17 NOTE — Assessment & Plan Note (Signed)
Chronic issue.  She will continue Claritin and Flonase.  We will add Singulair to see if that is beneficial.  She will monitor for side effects with this medication.

## 2021-05-24 DIAGNOSIS — Q828 Other specified congenital malformations of skin: Secondary | ICD-10-CM | POA: Diagnosis not present

## 2021-06-02 ENCOUNTER — Other Ambulatory Visit: Payer: Self-pay | Admitting: Family Medicine

## 2021-06-06 DIAGNOSIS — S20462A Insect bite (nonvenomous) of left back wall of thorax, initial encounter: Secondary | ICD-10-CM | POA: Diagnosis not present

## 2021-06-06 DIAGNOSIS — S40861A Insect bite (nonvenomous) of right upper arm, initial encounter: Secondary | ICD-10-CM | POA: Diagnosis not present

## 2021-06-06 DIAGNOSIS — S0086XA Insect bite (nonvenomous) of other part of head, initial encounter: Secondary | ICD-10-CM | POA: Diagnosis not present

## 2021-06-06 DIAGNOSIS — S20461A Insect bite (nonvenomous) of right back wall of thorax, initial encounter: Secondary | ICD-10-CM | POA: Diagnosis not present

## 2021-07-19 DIAGNOSIS — D1801 Hemangioma of skin and subcutaneous tissue: Secondary | ICD-10-CM | POA: Diagnosis not present

## 2021-07-19 DIAGNOSIS — L820 Inflamed seborrheic keratosis: Secondary | ICD-10-CM | POA: Diagnosis not present

## 2021-07-19 DIAGNOSIS — Q828 Other specified congenital malformations of skin: Secondary | ICD-10-CM | POA: Diagnosis not present

## 2021-08-09 DIAGNOSIS — G4733 Obstructive sleep apnea (adult) (pediatric): Secondary | ICD-10-CM | POA: Diagnosis not present

## 2021-09-05 ENCOUNTER — Other Ambulatory Visit: Payer: Self-pay

## 2021-09-05 ENCOUNTER — Encounter: Payer: Self-pay | Admitting: Advanced Practice Midwife

## 2021-09-05 ENCOUNTER — Ambulatory Visit (INDEPENDENT_AMBULATORY_CARE_PROVIDER_SITE_OTHER): Payer: BC Managed Care – PPO | Admitting: Advanced Practice Midwife

## 2021-09-05 VITALS — BP 130/80 | Ht 64.0 in | Wt 226.0 lb

## 2021-09-05 DIAGNOSIS — L0232 Furuncle of buttock: Secondary | ICD-10-CM | POA: Diagnosis not present

## 2021-09-05 DIAGNOSIS — R300 Dysuria: Secondary | ICD-10-CM

## 2021-09-05 LAB — POCT URINALYSIS DIPSTICK
Bilirubin, UA: NEGATIVE
Blood, UA: NEGATIVE
Glucose, UA: POSITIVE — AB
Ketones, UA: NEGATIVE
Leukocytes, UA: NEGATIVE
Nitrite, UA: NEGATIVE
Protein, UA: NEGATIVE
Spec Grav, UA: 1.01 (ref 1.010–1.025)
Urobilinogen, UA: 0.2 E.U./dL
pH, UA: 5 (ref 5.0–8.0)

## 2021-09-05 NOTE — Patient Instructions (Signed)
Skin Abscess  A skin abscess is an infected area on or under your skin that contains a collection of pus and other material. An abscess may also be called a furuncle,carbuncle, or boil. An abscess can occur in or on almost any part of your body. Some abscesses break open (rupture) on their own. Most continue to get worse unless they are treated. The infection can spread deeper into the body and eventually into your blood, whichcan make you feel ill. Treatment usually involves draining the abscess. What are the causes? An abscess occurs when germs, like bacteria, pass through your skin and cause an infection. This may be caused by: A scrape or cut on your skin. A puncture wound through your skin, including a needle injection or insect bite. Blocked oil or sweat glands. Blocked and infected hair follicles. A cyst that forms beneath your skin (sebaceous cyst) and becomes infected. What increases the risk? This condition is more likely to develop in people who: Have a weak body defense system (immune system). Have diabetes. Have dry and irritated skin. Get frequent injections or use illegal IV drugs. Have a foreign body in a wound, such as a splinter. Have problems with their lymph system or veins. What are the signs or symptoms? Symptoms of this condition include: A painful, firm bump under the skin. A bump with pus at the top. This may break through the skin and drain. Other symptoms include: Redness surrounding the abscess site. Warmth. Swelling of the lymph nodes (glands) near the abscess. Tenderness. A sore on the skin. How is this diagnosed? This condition may be diagnosed based on: A physical exam. Your medical history. A sample of pus. This may be used to find out what is causing the infection. Blood tests. Imaging tests, such as an ultrasound, CT scan, or MRI. How is this treated? A small abscess that drains on its own may not need treatment. Treatment for larger abscesses  may include: Moist heat or heat pack applied to the area several times a day. A procedure to drain the abscess (incision and drainage). Antibiotic medicines. For a severe abscess, you may first get antibiotics through an IV and then change to antibiotics by mouth. Follow these instructions at home: Medicines  Take over-the-counter and prescription medicines only as told by your health care provider. If you were prescribed an antibiotic medicine, take it as told by your health care provider. Do not stop taking the antibiotic even if you start to feel better.  Abscess care  If you have an abscess that has not drained, apply heat to the affected area. Use the heat source that your health care provider recommends, such as a moist heat pack or a heating pad. Place a towel between your skin and the heat source. Leave the heat on for 20-30 minutes. Remove the heat if your skin turns bright red. This is especially important if you are unable to feel pain, heat, or cold. You may have a greater risk of getting burned. Follow instructions from your health care provider about how to take care of your abscess. Make sure you: Cover the abscess with a bandage (dressing). Change your dressing or gauze as told by your health care provider. Wash your hands with soap and water before you change the dressing or gauze. If soap and water are not available, use hand sanitizer. Check your abscess every day for signs of a worsening infection. Check for: More redness, swelling, or pain. More fluid or blood. Warmth. More   pus or a bad smell.  General instructions To avoid spreading the infection: Do not share personal care items, towels, or hot tubs with others. Avoid making skin contact with other people. Keep all follow-up visits as told by your health care provider. This is important. Contact a health care provider if you have: More redness, swelling, or pain around your abscess. More fluid or blood coming  from your abscess. Warm skin around your abscess. More pus or a bad smell coming from your abscess. A fever. Muscle aches. Chills or a general ill feeling. Get help right away if you: Have severe pain. See red streaks on your skin spreading away from the abscess. Summary A skin abscess is an infected area on or under your skin that contains a collection of pus and other material. A small abscess that drains on its own may not need treatment. Treatment for larger abscesses may include having a procedure to drain the abscess and taking an antibiotic. This information is not intended to replace advice given to you by your health care provider. Make sure you discuss any questions you have with your healthcare provider. Document Revised: 03/27/2019 Document Reviewed: 01/17/2018 Elsevier Patient Education  2022 [employer]  

## 2021-09-05 NOTE — Progress Notes (Signed)
Patient ID: Brittany Santos, female   DOB: Mar 22, 1961, 60 y.o.   MRN: [id number]  Reason for Consult: Bartholin's Cyst and Dysuria    Subjective:  HPI:  Brittany Santos is a 60 y.o. female being seen for painful buttock skin bump that she first noticed about a week ago. The bump has gotten "a lot smaller" and less painful since then. She noticed some burning in the area when she urinated and some urethral pain with urination as well. She does not think it has opened and drained. She denies any history of HSV. She did try Neosporin which may have helped some. She did not try compresses or tub soaks. She had considered canceling the appointment since it seemed to be getting better.   We discussed that the bump she has appears to be a resolving boil although there is still some depth to it and tenderness to palpation. Warm compresses, tub soaks and neosporin topically is likely all that is needed. She is instructed to let us know of worsening symptoms. Her urinalysis is unremarkable except for glucose. We also discussed the importance of diabetes control for wound healing.  Past Medical History:  Diagnosis Date   Allergy    Arthritis    BRCA negative 01/2020   MyRisk neg   Diabetes mellitus without complication (HCC)    Family history of breast cancer    GERD (gastroesophageal reflux disease)    History of colon polyps 2016, 2017   at [facility]   History of diverticulitis    History of kidney stones 04/15/15   Increased risk of breast cancer 01/2020   IBIS=26.6%   Sleep apnea    Family History  Problem Relation Age of Onset   Hypertension Brother    Breast cancer Mother        twice--30s and 56 (bilat breasts)   Cancer Sister 74       Blood cancer (unknown)   Cancer Maternal Aunt        Lung   Kidney cancer Maternal Uncle 48   Heart attack Paternal Uncle    Aneurysm Paternal Grandmother    Prostate cancer Maternal Uncle 79       needed seed tx   Prostate cancer Cousin 58    Diabetes Neg Hx    Past Surgical History:  Procedure Laterality Date   BILATERAL CARPAL TUNNEL RELEASE     BREAST EXCISIONAL BIOPSY Bilateral    benign   BREAST SURGERY     cervical neck fusion     CHOLECYSTECTOMY     COLONOSCOPY  2017   Dr. Gustavo Lah with polyp; repeat due 2021   COLONOSCOPY WITH PROPOFOL N/A 08/12/2020   Procedure: COLONOSCOPY WITH PROPOFOL;  Surgeon: [name], MD;  Location: [facility] ENDOSCOPY;  Service: Endoscopy;  Laterality: N/A;   ENDOVENOUS ABLATION SAPHENOUS VEIN W/ LASER Left 04/15/2020   endovenous laser ablation left greater saphenous vein by Gae Gallop MD    ENDOVENOUS ABLATION SAPHENOUS VEIN W/ LASER Left 10/21/2020   endovenous laser ablation left small saphenous vein by Gae Gallop MD    ESOPHAGOGASTRODUODENOSCOPY (EGD) WITH PROPOFOL N/A 05/12/2016   Procedure: ESOPHAGOGASTRODUODENOSCOPY (EGD) WITH PROPOFOL;  Surgeon: Lollie Sails, MD;  Location: [facility] ENDOSCOPY;  Service: Endoscopy;  Laterality: N/A;   ESOPHAGOGASTRODUODENOSCOPY (EGD) WITH PROPOFOL N/A 08/12/2020   Procedure: ESOPHAGOGASTRODUODENOSCOPY (EGD) WITH PROPOFOL;  Surgeon: [name], MD;  Location: [facility] ENDOSCOPY;  Service: Endoscopy;  Laterality: N/A;    Short Social History:  Social History  Tobacco Use   Smoking status: Never   Smokeless tobacco: Never  Substance Use Topics   Alcohol use: No    Alcohol/week: 0.0 standard drinks    Allergies  Allergen Reactions   Clarithromycin     Bad taste in mouth and made her feel nauseated    Vicodin [Hydrocodone-Acetaminophen] Itching and Rash    Current Outpatient Medications  Medication Sig Dispense Refill   azelastine (ASTELIN) 0.1 % nasal spray Place 1 spray into both nostrils 2 (two) times daily. Use in each nostril as directed 30 mL 12   BIOTIN PO Take 1 tablet by mouth daily.     Calcium-Magnesium-Vitamin D (CALCIUM 500 PO) Take by mouth.     diclofenac (VOLTAREN) 75 MG EC tablet      famotidine (PEPCID) 20 MG tablet  TAKE 1 TABLET BY MOUTH TWICE DAILY 60 tablet 6   glucose blood test strip Dispense based on patient preference and insurance preference, Use as once daily Dx: E11.9 100 each 12   JARDIANCE 10 MG TABS tablet TAKE 1 TABLET BY MOUTH DAILY 30 tablet 4   Lancet Devices (ONE TOUCH DELICA LANCING DEV) MISC Use as directed Dx: E11.9 1 each 0   loratadine (CLARITIN) 10 MG tablet Take 1 tablet (10 mg total) by mouth daily. 30 tablet 11   metFORMIN (GLUCOPHAGE-XR) 500 MG 24 hr tablet TAKE 2 TABLETS (=1,000MG   TOTAL) 2 TIMES DAILY WITH  MEALS 360 tablet 1   montelukast (SINGULAIR) 10 MG tablet Take 1 tablet (10 mg total) by mouth at bedtime. 30 tablet 3   Multiple Vitamin (MULTIVITAMIN) capsule Take 1 capsule by mouth daily.     pantoprazole (PROTONIX) 40 MG tablet Take 1 tablet (40 mg total) by mouth 2 (two) times daily. 60 tablet 6   progesterone (PROMETRIUM) 100 MG capsule Take 1 capsule (100 mg total) by mouth daily. 90 capsule 3   progesterone (PROMETRIUM) 100 MG capsule Take 1 capsule (100 mg total) by mouth daily. 90 capsule 3   Sod Picosulfate-Mag Ox-Cit Acd (CLENPIQ) 10-3.5-12 MG-GM -GM/160ML SOLN Take 1 kit by mouth as directed. 320 mL 0   traMADol (ULTRAM) 50 MG tablet Take 50 mg by mouth every 6 (six) hours as needed.     vitamin B-12 (CYANOCOBALAMIN) 100 MCG tablet Take 100 mcg by mouth daily.     No current facility-administered medications for this visit.   Review of Systems  Constitutional:  Negative for chills and fever.  HENT:  Negative for congestion, ear discharge, ear pain, hearing loss, sinus pain and sore throat.   Eyes:  Negative for blurred vision and double vision.  Respiratory:  Negative for cough, shortness of breath and wheezing.   Cardiovascular:  Negative for chest pain, palpitations and leg swelling.  Gastrointestinal:  Negative for abdominal pain, blood in stool, constipation, diarrhea, heartburn, melena, nausea and vomiting.  Genitourinary:  Negative for dysuria, flank  pain, frequency, hematuria and urgency.  Musculoskeletal:  Negative for back pain, joint pain and myalgias.  Skin:  Negative for itching and rash.       Positive for skin bump  Neurological:  Negative for dizziness, tingling, tremors, sensory change, speech change, focal weakness, seizures, loss of consciousness, weakness and headaches.  Endo/Heme/Allergies:  Negative for environmental allergies. Does not bruise/bleed easily.  Psychiatric/Behavioral:  Negative for depression, hallucinations, memory loss, substance abuse and suicidal ideas. The patient is not nervous/anxious and does not have insomnia.        Objective:  Objective  Vitals:   09/05/21 1055  BP: 130/80  Weight: 226 lb (102.5 kg)  Height: _0  (1.626 m)   Body mass index is 38.79 kg/m. Constitutional: obese female in no acute distress.  HEENT: normal Skin: Warm and dry.  Cardiovascular: Regular rate and rhythm.   Respiratory: Clear to auscultation bilateral. Normal respiratory effort Neuro: DTRs 2+, Cranial nerves grossly intact Psych: Alert and Oriented x3. No memory deficits. Normal mood and affect.  MS: normal gait, normal bilateral lower extremity ROM/strength/stability.  Pelvic exam:  is not limited by body habitus EGBUS: 1 cm, round, slightly raised, with purple hue on right buttock, tender to touch/light squeeze, no drainage or head visible, approximately 1 cm deep, no evidence of infection   Data: Results for Brittany, Santos (MRN [id number]) as of 09/05/2021 11:45  Ref. Range 09/05/2021 11:04  Bilirubin, UA Unknown Negative  Glucose Latest Ref Range: Negative  Positive (A)  Ketones, UA Unknown Negative  Leukocytes,UA Latest Ref Range: Negative  Negative  Nitrite, UA Unknown Negative  pH, UA Latest Ref Range: 5.0 - 8.0  5.0  Protein,UA Latest Ref Range: Negative  Negative  Specific Gravity, UA Latest Ref Range: 1.010 - 1.025  1.010  Urobilinogen, UA Latest Ref Range: 0.2 or 1.0 E.U./dL 0.2  RBC,  UA Unknown Negative       Assessment/Plan:     60 y.o. female with resolving boil of buttock  Conservative treatment recommended Warm compresses Tub soaks Neosporin applied topically Control of diabetes/adequate protein and vitamin C Return for worsening symptoms/infection Antibiotics or I&D if needed    [facility] 09/05/2021, 11:50 AM

## 2021-09-11 ENCOUNTER — Other Ambulatory Visit: Payer: Self-pay | Admitting: Family Medicine

## 2021-09-15 ENCOUNTER — Other Ambulatory Visit: Payer: Self-pay | Admitting: Gastroenterology

## 2021-10-19 ENCOUNTER — Telehealth: Payer: Self-pay

## 2021-10-19 NOTE — Telephone Encounter (Signed)
Pt calling; tried calling [facility] to schedule her mammogram and was told there is no order there.  Please put order in so she can schedule mammogram.  [phone]

## 2021-10-20 ENCOUNTER — Other Ambulatory Visit: Payer: Self-pay | Admitting: Obstetrics and Gynecology

## 2021-10-20 DIAGNOSIS — Z1231 Encounter for screening mammogram for malignant neoplasm of breast: Secondary | ICD-10-CM

## 2021-10-20 NOTE — Progress Notes (Signed)
Mammo order, due 10/22, annual due 2/23

## 2021-10-20 NOTE — Telephone Encounter (Signed)
Mammo order placed, pt can sched

## 2021-10-20 NOTE — Telephone Encounter (Signed)
Left detailed msg 'order has been placed; you can now schedule your appointment.'

## 2021-11-01 ENCOUNTER — Encounter: Payer: Self-pay | Admitting: Family Medicine

## 2021-11-04 ENCOUNTER — Telehealth: Payer: Self-pay

## 2021-11-04 DIAGNOSIS — I10 Essential (primary) hypertension: Secondary | ICD-10-CM

## 2021-11-04 DIAGNOSIS — E119 Type 2 diabetes mellitus without complications: Secondary | ICD-10-CM

## 2021-11-04 NOTE — Telephone Encounter (Signed)
Pt returned call for Brittany Santos. Pt was advised she needs to be scheduled for a lab appointment

## 2021-11-04 NOTE — Telephone Encounter (Signed)
I called the patient to schedule a lab appointment 2-3 days before her 6 mo follow up visit on 11/18/2021, LM with her mother to call back. Labs are ordered. Anaria Kroner,cma

## 2021-11-04 NOTE — Telephone Encounter (Signed)
Patient called and labs scheduled before patient has her Carazon injection in her leg.

## 2021-11-14 ENCOUNTER — Other Ambulatory Visit (INDEPENDENT_AMBULATORY_CARE_PROVIDER_SITE_OTHER): Payer: BC Managed Care – PPO

## 2021-11-14 ENCOUNTER — Other Ambulatory Visit: Payer: Self-pay

## 2021-11-14 DIAGNOSIS — E119 Type 2 diabetes mellitus without complications: Secondary | ICD-10-CM

## 2021-11-14 DIAGNOSIS — I10 Essential (primary) hypertension: Secondary | ICD-10-CM | POA: Diagnosis not present

## 2021-11-14 LAB — LIPID PANEL
Cholesterol: 130 mg/dL (ref 0–200)
HDL: 53.5 mg/dL (ref >39.00)
LDL Cholesterol: 59 mg/dL (ref 0–99)
NonHDL: 76.82
Total CHOL/HDL Ratio: 2
Triglycerides: 87 mg/dL (ref 0.0–149.0)
VLDL: 17.4 mg/dL (ref 0.0–40.0)

## 2021-11-14 LAB — COMPREHENSIVE METABOLIC PANEL
ALT: 23 U/L (ref 0–35)
AST: 22 U/L (ref 0–37)
Albumin: 4.3 g/dL (ref 3.5–5.2)
Alkaline Phosphatase: 62 U/L (ref 39–117)
BUN: 16 mg/dL (ref 6–23)
CO2: 29 mEq/L (ref 19–32)
Calcium: 9.8 mg/dL (ref 8.4–10.5)
Chloride: 100 mEq/L (ref 96–112)
Creatinine, Ser: 0.82 mg/dL (ref 0.40–1.20)
GFR: 77.04 mL/min (ref >60.00)
Glucose, Bld: 159 mg/dL — ABNORMAL HIGH (ref 70–99)
Potassium: 3.9 mEq/L (ref 3.5–5.1)
Sodium: 138 mEq/L (ref 135–145)
Total Bilirubin: 0.7 mg/dL (ref 0.2–1.2)
Total Protein: 6.8 g/dL (ref 6.0–8.3)

## 2021-11-14 LAB — HEMOGLOBIN A1C: Hgb A1c MFr Bld: 7.1 % — ABNORMAL HIGH (ref 4.6–6.5)

## 2021-11-17 ENCOUNTER — Other Ambulatory Visit: Payer: Self-pay | Admitting: Family Medicine

## 2021-11-17 DIAGNOSIS — G5701 Lesion of sciatic nerve, right lower limb: Secondary | ICD-10-CM | POA: Diagnosis not present

## 2021-11-18 ENCOUNTER — Telehealth: Payer: Self-pay

## 2021-11-18 ENCOUNTER — Encounter: Payer: Self-pay | Admitting: Family Medicine

## 2021-11-18 ENCOUNTER — Telehealth (INDEPENDENT_AMBULATORY_CARE_PROVIDER_SITE_OTHER): Payer: BC Managed Care – PPO | Admitting: Family Medicine

## 2021-11-18 VITALS — Ht 64.0 in | Wt 225.0 lb

## 2021-11-18 DIAGNOSIS — E119 Type 2 diabetes mellitus without complications: Secondary | ICD-10-CM | POA: Diagnosis not present

## 2021-11-18 DIAGNOSIS — Z780 Asymptomatic menopausal state: Secondary | ICD-10-CM | POA: Diagnosis not present

## 2021-11-18 DIAGNOSIS — K219 Gastro-esophageal reflux disease without esophagitis: Secondary | ICD-10-CM

## 2021-11-18 NOTE — Telephone Encounter (Signed)
-----   Message from Leone Haven, MD sent at 11/18/2021  8:33 AM EST ----- Can you call [facility] and see if they happen to have a bone density scan on file for the last 1 to 2 years for this patient?  Thanks.

## 2021-11-18 NOTE — Progress Notes (Signed)
I received a call back from [facility] and they stated the last Bone density the patient had done was 2015 and it was not there because they do not do those, but  her chart is showing the last one  was 2015.  Haja Crego,cma

## 2021-11-18 NOTE — Assessment & Plan Note (Signed)
Discussed trying a dose reduction of the Protonix to 40 mg once daily.  Discussed that the goal with that type of medication is to get on the lowest effective dose.  Discussed risk of inadequate absorption of certain vitamins and minerals as well as the potential for weakened bones.  Advised that if she not able to tolerate the once daily dosing she can always go back to the twice daily dosing.

## 2021-11-18 NOTE — Telephone Encounter (Signed)
[name] from pt Primary Care office called to see if we had a Bone density scan on pt. [name] aware we do not do Bone density scans in our office, [phone]

## 2021-11-18 NOTE — Progress Notes (Signed)
I called to [facility] and LVM for them to call me back.  Jaqueline Uber,cma

## 2021-11-18 NOTE — Progress Notes (Signed)
Virtual Visit via video Note  This visit type was conducted due to national recommendations for restrictions regarding the COVID-19 pandemic (e.g. social distancing).  This format is felt to be most appropriate for this patient at this time.  All issues noted in this document were discussed and addressed.  No physical exam was performed (except for noted visual exam findings with Video Visits).   I connected with Brittany Santos today at  8:00 AM EST by a video enabled telemedicine application and verified that I am speaking with the correct person using two identifiers. Location patient: home Location provider: home office Persons participating in the virtual visit: patient, provider  I discussed the limitations, risks, security and privacy concerns of performing an evaluation and management service by telephone and the availability of in person appointments. I also discussed with the patient that there may be a patient responsible charge related to this service. The patient expressed understanding and agreed to proceed.   Reason for visit: f/u  HPI: DIABETES Disease Monitoring: Blood Sugar ranges-140-150 Polyuria/phagia/dipsia- no      Optho- UTD Medications: Compliance- taking jardiance, metformin Hypoglycemic symptoms- no Patient notes lots of stress from work and working long hours that contributes to a poor diet. She is thinking about retiring in the new year and believes her eating and activity levels will improve with this.   GERD:   Reflux symptoms: only if she eats something she shouldn't   Blood in stool: no    EGD: 08/12/20 with gastritis and a small hiatal hernia  Medication: protonix 40 mg BID     ROS: See pertinent positives and negatives per HPI.  Past Medical History:  Diagnosis Date   Allergy    Arthritis    BRCA negative 01/2020   MyRisk neg   Diabetes mellitus without complication ([place])    Family history of breast cancer    GERD (gastroesophageal reflux  disease)    History of colon polyps 2016, 2017   at [facility]   History of diverticulitis    History of kidney stones 04/15/15   Increased risk of breast cancer 01/2020   IBIS=26.6%   Sleep apnea     Past Surgical History:  Procedure Laterality Date   BILATERAL CARPAL TUNNEL RELEASE     BREAST EXCISIONAL BIOPSY Bilateral    benign   BREAST SURGERY     cervical neck fusion     CHOLECYSTECTOMY     COLONOSCOPY  2017   Dr. Gustavo Lah with polyp; repeat due 2021   COLONOSCOPY WITH PROPOFOL N/A 08/12/2020   Procedure: COLONOSCOPY WITH PROPOFOL;  Surgeon: [name], MD;  Location: [facility] ENDOSCOPY;  Service: Endoscopy;  Laterality: N/A;   ENDOVENOUS ABLATION SAPHENOUS VEIN W/ LASER Left 04/15/2020   endovenous laser ablation left greater saphenous vein by Gae Gallop MD    ENDOVENOUS ABLATION SAPHENOUS VEIN W/ LASER Left 10/21/2020   endovenous laser ablation left small saphenous vein by Gae Gallop MD    ESOPHAGOGASTRODUODENOSCOPY (EGD) WITH PROPOFOL N/A 05/12/2016   Procedure: ESOPHAGOGASTRODUODENOSCOPY (EGD) WITH PROPOFOL;  Surgeon: Lollie Sails, MD;  Location: [facility] ENDOSCOPY;  Service: Endoscopy;  Laterality: N/A;   ESOPHAGOGASTRODUODENOSCOPY (EGD) WITH PROPOFOL N/A 08/12/2020   Procedure: ESOPHAGOGASTRODUODENOSCOPY (EGD) WITH PROPOFOL;  Surgeon: [name], MD;  Location: [facility] ENDOSCOPY;  Service: Endoscopy;  Laterality: N/A;    Family History  Problem Relation Age of Onset   Hypertension Brother    Breast cancer Mother        twice--30s and 90 (  bilat breasts)   Cancer Sister 46       Blood cancer (unknown)   Cancer Maternal Aunt        Lung   Kidney cancer Maternal Uncle 50   Heart attack Paternal Uncle    Aneurysm Paternal Grandmother    Prostate cancer Maternal Uncle 52       needed seed tx   Prostate cancer Cousin 37   Diabetes Neg Hx     SOCIAL HX: nonsmoker   Current Outpatient Medications:    azelastine (ASTELIN) 0.1 % nasal spray, Place 1 spray into both  nostrils 2 (two) times daily. Use in each nostril as directed, Disp: 30 mL, Rfl: 12   BIOTIN PO, Take 1 tablet by mouth daily., Disp: , Rfl:    Calcium-Magnesium-Vitamin D (CALCIUM 500 PO), Take by mouth., Disp: , Rfl:    diclofenac (VOLTAREN) 75 MG EC tablet, , Disp: , Rfl:    famotidine (PEPCID) 20 MG tablet, TAKE 1 TABLET BY MOUTH TWICE DAILY, Disp: 60 tablet, Rfl: 6   glucose blood test strip, Dispense based on patient preference and insurance preference, Use as once daily Dx: E11.9, Disp: 100 each, Rfl: 12   JARDIANCE 10 MG TABS tablet, TAKE 1 TABLET BY MOUTH EVERY DAY, Disp: 30 tablet, Rfl: 4   Lancet Devices (ONE TOUCH DELICA LANCING DEV) MISC, Use as directed Dx: E11.9, Disp: 1 each, Rfl: 0   loratadine (CLARITIN) 10 MG tablet, Take 1 tablet (10 mg total) by mouth daily., Disp: 30 tablet, Rfl: 11   metFORMIN (GLUCOPHAGE-XR) 500 MG 24 hr tablet, TAKE 2 TABLETS  2 TIMES    DAILY WITH   MEALS, Disp: 360 tablet, Rfl: 1   montelukast (SINGULAIR) 10 MG tablet, Take 1 tablet (10 mg total) by mouth at bedtime., Disp: 30 tablet, Rfl: 3   Multiple Vitamin (MULTIVITAMIN) capsule, Take 1 capsule by mouth daily., Disp: , Rfl:    pantoprazole (PROTONIX) 40 MG tablet, Take 1 tablet (40 mg total) by mouth 2 (two) times daily. **PLEASE SCHEDULE FOLLOW UP APPT**, Disp: 60 tablet, Rfl: 3   progesterone (PROMETRIUM) 100 MG capsule, Take 1 capsule (100 mg total) by mouth daily., Disp: 90 capsule, Rfl: 3   progesterone (PROMETRIUM) 100 MG capsule, Take 1 capsule (100 mg total) by mouth daily., Disp: 90 capsule, Rfl: 3   Sod Picosulfate-Mag Ox-Cit Acd (CLENPIQ) 10-3.5-12 MG-GM -GM/160ML SOLN, Take 1 kit by mouth as directed., Disp: 320 mL, Rfl: 0   traMADol (ULTRAM) 50 MG tablet, Take 50 mg by mouth every 6 (six) hours as needed., Disp: , Rfl:    vitamin B-12 (CYANOCOBALAMIN) 100 MCG tablet, Take 100 mcg by mouth daily., Disp: , Rfl:   EXAM:  VITALS per patient if applicable:  GENERAL: alert, oriented,  appears well and in no acute distress  HEENT: atraumatic, conjunttiva clear, no obvious abnormalities on inspection of external nose and ears  NECK: normal movements of the head and neck  LUNGS: on inspection no signs of respiratory distress, breathing rate appears normal, no obvious gross SOB, gasping or wheezing  CV: no obvious cyanosis  MS: moves all visible extremities without noticeable abnormality  PSYCH/NEURO: pleasant and cooperative, no obvious depression or anxiety, speech and thought processing grossly intact  ASSESSMENT AND PLAN:  Discussed the following assessment and plan:  Problem List Items Addressed This Visit     GERD (gastroesophageal reflux disease)    Discussed trying a dose reduction of the Protonix to 40 mg once daily.  Discussed that the goal with that type of medication is to get on the lowest effective dose.  Discussed risk of inadequate absorption of certain vitamins and minerals as well as the potential for weakened bones.  Advised that if she not able to tolerate the once daily dosing she can always go back to the twice daily dosing.      Type 2 diabetes mellitus (HCC)    Slightly above goal.  Her diet and lifestyle are likely contributing to this.  I discussed the options of focusing on dietary changes versus increasing her Jardiance dose.  She opted for dietary changes.  She will continue Jardiance 10 mg once daily and metformin XR 1000 mg twice daily.  We will recheck in 6 months.  If not improved at that time we will plan on altering her medication regimen.  I did discuss starting on a statin with the patient.  We discussed that her LDL cholesterol is at goal though that there is some risk reduction benefit with being on a statin with regards to reducing the risk of stroke and heart attack in patients with diabetes.  We discussed the risk of the medication and the potential benefit.  She prefers not to go on any additional medication at this time and we will  continue to monitor her cholesterol.      Health maintenance: It appears the patient may be due for a bone density scan.  She believes she had one last year.  I will have the CMA contact the patient's GYN office to determine if she had a bone density scan that we cannot see.  Return in about 6 months (around 05/19/2022) for Diabetes.   I discussed the assessment and treatment plan with the patient. The patient was provided an opportunity to ask questions and all were answered. The patient agreed with the plan and demonstrated an understanding of the instructions.   The patient was advised to call back or seek an in-person evaluation if the symptoms worsen or if the condition fails to improve as anticipated.   [name], MD

## 2021-11-18 NOTE — Assessment & Plan Note (Signed)
Slightly above goal.  Her diet and lifestyle are likely contributing to this.  I discussed the options of focusing on dietary changes versus increasing her Jardiance dose.  She opted for dietary changes.  She will continue Jardiance 10 mg once daily and metformin XR 1000 mg twice daily.  We will recheck in 6 months.  If not improved at that time we will plan on altering her medication regimen.  I did discuss starting on a statin with the patient.  We discussed that her LDL cholesterol is at goal though that there is some risk reduction benefit with being on a statin with regards to reducing the risk of stroke and heart attack in patients with diabetes.  We discussed the risk of the medication and the potential benefit.  She prefers not to go on any additional medication at this time and we will continue to monitor her cholesterol.

## 2021-11-25 NOTE — Progress Notes (Signed)
Noted. Can you contact the patient and advise her she did not have a bone density scan last year and is due for one? I placed an order and she needs to call [phone] to schedule this.

## 2021-11-25 NOTE — Addendum Note (Signed)
Addended by: Leone Haven on: 11/25/2021 09:11 AM   Modules accepted: Orders

## 2021-12-01 DIAGNOSIS — G4733 Obstructive sleep apnea (adult) (pediatric): Secondary | ICD-10-CM | POA: Diagnosis not present

## 2021-12-13 NOTE — Progress Notes (Signed)
I called and spoke with the patient and I gave her the number to call and schedule a bone density and she understood.  Sandi Towe,cma

## 2021-12-27 ENCOUNTER — Other Ambulatory Visit: Payer: Self-pay

## 2021-12-27 ENCOUNTER — Ambulatory Visit
Admission: RE | Admit: 2021-12-27 | Discharge: 2021-12-27 | Disposition: A | Discharge status: Home / Self Care | Payer: BC Managed Care – PPO | Source: Ambulatory Visit | Attending: Obstetrics and Gynecology | Admitting: Obstetrics and Gynecology

## 2021-12-27 DIAGNOSIS — Z1231 Encounter for screening mammogram for malignant neoplasm of breast: Secondary | ICD-10-CM | POA: Diagnosis not present

## 2022-01-01 DIAGNOSIS — G4733 Obstructive sleep apnea (adult) (pediatric): Secondary | ICD-10-CM | POA: Diagnosis not present

## 2022-01-05 DIAGNOSIS — J069 Acute upper respiratory infection, unspecified: Secondary | ICD-10-CM | POA: Diagnosis not present

## 2022-01-05 DIAGNOSIS — J019 Acute sinusitis, unspecified: Secondary | ICD-10-CM | POA: Diagnosis not present

## 2022-01-05 DIAGNOSIS — Z03818 Encounter for observation for suspected exposure to other biological agents ruled out: Secondary | ICD-10-CM | POA: Diagnosis not present

## 2022-01-31 ENCOUNTER — Telehealth: Payer: Self-pay

## 2022-01-31 ENCOUNTER — Other Ambulatory Visit: Payer: Self-pay

## 2022-01-31 DIAGNOSIS — N951 Menopausal and female climacteric states: Secondary | ICD-10-CM

## 2022-01-31 MED ORDER — PANTOPRAZOLE SODIUM 40 MG PO TBEC: 40.0000 mg | DELAYED_RELEASE_TABLET | Freq: Two times a day (BID) | ORAL | 0 refills | Status: DC

## 2022-01-31 MED ORDER — PROGESTERONE MICRONIZED 100 MG PO CAPS: 100.0000 mg | ORAL_CAPSULE | Freq: Every day | ORAL | 0 refills | Status: DC

## 2022-01-31 NOTE — Telephone Encounter (Signed)
Called and left voicemail for patient to call back to be scheduled. 

## 2022-01-31 NOTE — Telephone Encounter (Signed)
Pt calling; has appt 03/06/22; pt aware refill eRx'd.

## 2022-01-31 NOTE — Telephone Encounter (Signed)
Pt calling; for refill of progesterone; states pharm has sent 2 requests with no response; know she is about due for annual but not sure when.  [phone]

## 2022-01-31 NOTE — Telephone Encounter (Signed)
Patient states she needs a refill on Pantoprazole. Patient has not been seen in over 1 year so made patient a mychart visit for a follow up visit. Can medication be refilled till appointment

## 2022-02-01 DIAGNOSIS — G4733 Obstructive sleep apnea (adult) (pediatric): Secondary | ICD-10-CM | POA: Diagnosis not present

## 2022-02-06 ENCOUNTER — Ambulatory Visit
Admission: RE | Admit: 2022-02-06 | Discharge: 2022-02-06 | Disposition: A | Discharge status: Home / Self Care | Payer: BC Managed Care – PPO | Source: Ambulatory Visit | Attending: Family Medicine | Admitting: Family Medicine

## 2022-02-06 ENCOUNTER — Other Ambulatory Visit: Payer: Self-pay

## 2022-02-06 DIAGNOSIS — Z780 Asymptomatic menopausal state: Secondary | ICD-10-CM | POA: Diagnosis not present

## 2022-02-09 DIAGNOSIS — H2513 Age-related nuclear cataract, bilateral: Secondary | ICD-10-CM | POA: Diagnosis not present

## 2022-02-09 DIAGNOSIS — Z7984 Long term (current) use of oral hypoglycemic drugs: Secondary | ICD-10-CM | POA: Diagnosis not present

## 2022-02-09 DIAGNOSIS — E119 Type 2 diabetes mellitus without complications: Secondary | ICD-10-CM | POA: Diagnosis not present

## 2022-02-09 DIAGNOSIS — H5213 Myopia, bilateral: Secondary | ICD-10-CM | POA: Diagnosis not present

## 2022-02-13 LAB — HM DIABETES EYE EXAM

## 2022-02-16 ENCOUNTER — Encounter: Payer: Self-pay | Admitting: Gastroenterology

## 2022-02-16 ENCOUNTER — Telehealth (INDEPENDENT_AMBULATORY_CARE_PROVIDER_SITE_OTHER): Payer: BC Managed Care – PPO | Admitting: Gastroenterology

## 2022-02-16 DIAGNOSIS — K219 Gastro-esophageal reflux disease without esophagitis: Secondary | ICD-10-CM

## 2022-02-16 NOTE — Progress Notes (Signed)
[name], MD [address]  [place]  [place],  [address]  Main: [phone]  Fax: [phone]    Gastroenterology Virtual/Video Visit  Referring Provider:     Leone Haven, MD Primary Care Physician:  Leone Haven, MD Primary Gastroenterologist:  Dr.Jessicaann Overbaugh Allen Norris Reason for Consultation:     Medication refill        HPI:    Virtual Visit via Video Note Location of the patient: Home Location of provider: Office  Participating persons: The patient and myself.  I connected with Maiya Kates Antonio on 02/16/22 at  2:30 PM EST by a video enabled telemedicine application and verified that I am speaking with the correct person using two identifiers.   I discussed the limitations of evaluation and management by telemedicine and the availability of in person appointments. The patient expressed understanding and agreed to proceed.  Verbal consent to proceed obtained.  History of Present Illness: Lunden Stieber is a 61 y.o. female referred by Dr. Caryl Bis, Angela Adam, MD  for consultation & management of medication.  The patient has been on the pantoprazole and she states it is working well for her.  It is not covered by her insurance but she gets it from her local pharmacy and usually runs under $20 for a 100-monthsupply.  The patient denies any unexplained weight loss fevers chills nausea vomiting black stools or bloody stools.  The patient last colonoscopy was negative for any polyps and she was recommended to have a repeat colonoscopy in 7 years from 2021.  Past Medical History:  Diagnosis Date   Allergy    Arthritis    BRCA negative 01/2020   MyRisk neg   [place] complication [place]    Family history of breast cancer    GERD (gastroesophageal reflux disease)    [facility]  History of diverticulitis    History of kidney stones 04/15/15   Increased risk of breast cancer 01/2020   IBIS=26.6%    Sleep apnea     Past Surgical History:  Procedure Laterality Date   BILATERAL CARPAL TUNNEL RELEASE     BREAST EXCISIONAL BIOPSY Bilateral    benign   BREAST SURGERY     cervical neck fusion     CHOLECYSTECTOMY     COLONOSCOPY  2017   Dr. SGustavo Lahwith polyp; repeat due 2021   COLONOSCOPY WITH PROPOFOL N/A 08/12/2020   Procedure: COLONOSCOPY WITH PROPOFOL;  Surgeon: WLucilla Lame MD;  [facility];  Service: Endoscopy;  Laterality: N/A;   ENDOVENOUS ABLATION SAPHENOUS VEIN W/ LASER Left 04/15/2020   endovenous laser ablation left greater saphenous vein by CGae GallopMD    ENDOVENOUS ABLATION SAPHENOUS VEIN W/ LASER Left 10/21/2020   endovenous laser ablation left small saphenous vein by CGae GallopMD    ESOPHAGOGASTRODUODENOSCOPY (EGD) WITH PROPOFOL N/A 05/12/2016   Procedure: ESOPHAGOGASTRODUODENOSCOPY (EGD) WITH PROPOFOL;  Surgeon: MLollie Sails MD;  [facility];  Service: Endoscopy;  Laterality: N/A;   ESOPHAGOGASTRODUODENOSCOPY (EGD) WITH PROPOFOL N/A 08/12/2020   Procedure: ESOPHAGOGASTRODUODENOSCOPY (EGD) WITH PROPOFOL;  Surgeon: WLucilla Lame MD;  [facility];  Service: Endoscopy;  Laterality: N/A;    Prior to Admission medications   Medication Sig Start Date End Date Taking? Authorizing Provider  azelastine (ASTELIN) 0.1 % nasal spray Place 1 spray into both nostrils 2 (two) times daily. Use in each nostril as directed 01/28/21   SCaryl Bis  Angela Adam, MD  BIOTIN PO Take 1 tablet by mouth daily.    [provider]  Calcium-Magnesium-Vitamin D (CALCIUM 500 PO) Take by mouth.    [provider]  diclofenac (VOLTAREN) 75 MG EC tablet  11/13/16   [provider]  famotidine (PEPCID) 20 MG tablet TAKE 1 TABLET BY MOUTH TWICE DAILY 12/23/20   [name], MD  glucose blood test strip Dispense based on patient preference and insurance preference, Use as once daily Dx: E11.9 01/09/20   Leone Haven, MD   JARDIANCE 10 MG TABS tablet TAKE 1 TABLET BY MOUTH EVERY DAY 09/13/21   Leone Haven, MD  Lancet Devices (ONE TOUCH DELICA LANCING DEV) MISC Use as directed Dx: E11.9 09/29/15   Rubbie Battiest, RN  loratadine (CLARITIN) 10 MG tablet Take 1 tablet (10 mg total) by mouth daily. 01/28/21   Leone Haven, MD  metFORMIN (GLUCOPHAGE-XR) 500 MG 24 hr tablet TAKE 2 TABLETS  2 TIMES    DAILY WITH   MEALS 11/17/21   Leone Haven, MD  montelukast (SINGULAIR) 10 MG tablet Take 1 tablet (10 mg total) by mouth at bedtime. 05/17/21   Leone Haven, MD  Multiple Vitamin (MULTIVITAMIN) capsule Take 1 capsule by mouth daily.    [provider]  pantoprazole (PROTONIX) 40 MG tablet Take 1 tablet (40 mg total) by mouth 2 (two) times daily. 01/31/22   [name], MD  progesterone (PROMETRIUM) 100 MG capsule Take 1 capsule (100 mg total) by mouth daily. 1/94/17   Copland, Deirdre Evener, PA-C  progesterone (PROMETRIUM) 100 MG capsule Take 1 capsule (100 mg total) by mouth daily. 03/25/13   Copland, Deirdre Evener, PA-C  Sod Picosulfate-Mag Ox-Cit Acd (CLENPIQ) 10-3.5-12 MG-GM -GM/160ML SOLN Take 1 kit by mouth as directed. 07/28/20   [name], MD  traMADol (ULTRAM) 50 MG tablet Take 50 mg by mouth every 6 (six) hours as needed.    [provider]  vitamin B-12 (CYANOCOBALAMIN) 100 MCG tablet Take 100 mcg by mouth daily.    [provider]    Family History  Problem Relation Age of Onset   Hypertension Brother    Breast cancer Mother        twice--30s and 55 (bilat breasts)   Cancer Sister 89       Blood cancer (unknown)   Cancer Maternal Aunt        Lung   Kidney cancer Maternal Uncle 8   Heart attack Paternal Uncle    Aneurysm Paternal Grandmother    Prostate cancer Maternal Uncle 79       needed seed tx   Prostate cancer Cousin 58   Diabetes Neg Hx      Social History   Tobacco Use   Smoking status: Never   Smokeless tobacco: Never  Vaping Use   Vaping Use:  Never used  Substance Use Topics   Alcohol use: No    Alcohol/week: 0.0 standard drinks   Drug use: No    Allergies as of 02/16/2022 - Review Complete 11/18/2021  Allergen Reaction Noted   Clarithromycin  03/14/2019   Vicodin [hydrocodone-acetaminophen] Itching and Rash 01/20/2015    Review of Systems:    All systems reviewed and negative except where noted in HPI.   Observations/Objective:  Labs: CBC    Component Value Date/Time   WBC 8.3 04/26/2018 0832   RBC 4.24 04/26/2018 0832   HGB 12.9 04/26/2018 0832   HCT 38.3 04/26/2018 0832  PLT 251.0 04/26/2018 0832   MCV 90.5 04/26/2018 0832   MCH 28.9 10/21/2015 1724   MCHC 33.6 04/26/2018 0832   RDW 15.0 04/26/2018 0832   LYMPHSABS 1.8 06/07/2009 1620   MONOABS 0.6 06/07/2009 1620   EOSABS 0.2 06/07/2009 1620   BASOSABS 0.0 06/07/2009 1620   CMP     Component Value Date/Time   NA 138 11/14/2021 0734   NA 142 10/09/2014 0000   K 3.9 11/14/2021 0734   CL 100 11/14/2021 0734   CO2 29 11/14/2021 0734   GLUCOSE 159 (H) 11/14/2021 0734   BUN 16 11/14/2021 0734   BUN 11 10/09/2014 0000   CREATININE 0.82 11/14/2021 0734   CALCIUM 9.8 11/14/2021 0734   PROT 6.8 11/14/2021 0734   ALBUMIN 4.3 11/14/2021 0734   AST 22 11/14/2021 0734   ALT 23 11/14/2021 0734   ALKPHOS 62 11/14/2021 0734   BILITOT 0.7 11/14/2021 0734   GFRNONAA >60 10/21/2015 1724   GFRAA >60 10/21/2015 1724    Imaging Studies: DG Bone Density  Result Date: 02/06/2022 EXAM: DUAL X-RAY ABSORPTIOMETRY (DXA) FOR BONE MINERAL DENSITY IMPRESSION: Your patient Pretty Weltman completed a BMD test on 02/06/2022 using the [employer] (software version: 14.10) manufactured by [employer]. The following summarizes the results of our evaluation. Technologist:VLM PATIENT BIOGRAPHICAL: Name: Korina, Tretter Patient ID: [id number] Birth Date: 05-Dec-1961 Height: 64.0 in. Gender: Female Exam Date: 02/06/2022 Weight: 219.0 lbs. Indications:  Caucasian, Corticosteroid, Diabetic, History of Spinal Surgery, Postmenopausal Fractures: compression fracture, Spine Treatments: Calcium, Vitamin D DENSITOMETRY RESULTS: Site         Region        Measured Date Measured Age WHO Classification Young Adult T-score BMD         %Change vs. Previous Significant Change (*) AP Spine L1-L4 (L2,L3) 02/06/2022 61.9 Normal 0.5 1.249 g/cm2 2.1% - AP Spine L1-L4 (L2,L3) 07/29/2014 54.4 Normal 0.3 1.223 g/cm2 - - DualFemur Neck Right 02/06/2022 61.9 Normal -0.1 1.019 g/cm2 -5.4% Yes DualFemur Neck Right 07/29/2014 54.4 Normal 0.3 1.077 g/cm2 - - DualFemur Total Mean 02/06/2022 61.9 Normal 0.3 1.051 g/cm2 -1.0% - DualFemur Total Mean 07/29/2014 54.4 Normal 0.4 1.062 g/cm2 - - Left Forearm Radius 33% 02/06/2022 61.9 Normal 0.8 0.944 g/cm2 -1.8% - Left Forearm Radius 33% 07/29/2014 54.4 Normal 1.0 0.961 g/cm2 - - ASSESSMENT: The BMD measured at Femur Neck Right is 1.019 g/cm2 with a T-score of -0.1. This patient is considered normal according to [facility]) criteria. Compared with prior study, there has been no significant change in the spine. Compared with prior study, there has been significant decrease in the total hip. The scan quality was limited due to patient moblity. World Pharmacologist [facility]) criteria for post-menopausal, Caucasian Women: Normal:                   T-score at or above -1 SD Osteopenia/low bone mass: T-score between -1 and -2.5 SD Osteoporosis:             T-score at or below -2.5 SD RECOMMENDATIONS: 1. All patients should optimize calcium and vitamin D intake. 2. Consider FDA-approved medical therapies in postmenopausal women and men aged 70 years and older, based on the following: a. A hip or vertebral(clinical or morphometric) fracture b. T-score < -2.5 at the femoral neck or spine after appropriate evaluation to exclude secondary causes c. Low bone mass (T-score between -1.0 and -2.5 at the femoral neck or spine) and a  10-year  probability of a hip fracture > 3% or a 10-year probability of a major osteoporosis-related fracture > 20% based on the [place]-adapted WHO algorithm 3. Clinician judgment and/or patient preferences may indicate treatment for people with 10-year fracture probabilities above or below these levels FOLLOW-UP: People with diagnosed cases of osteoporosis or at high risk for fracture should have regular bone mineral density tests. For patients eligible for Medicare, routine testing is allowed once every 2 years. The testing frequency can be increased to one year for patients who have rapidly progressing disease, those who are receiving or discontinuing medical therapy to restore bone mass, or have additional risk factors. I have reviewed this report, and agree with the above findings. Mark A. Thornton Papas, M.D. [facility] Electronically Signed   By: Lavonia Dana M.D.   On: 02/06/2022 12:05    Assessment and Plan:   Ijanae Macapagal [id number] is a 61 y.o. y/o female who is in need of a refill of her medication pantoprazole for her heartburn.  The patient has been doing well on the pantoprazole without any acid breakthrough.  The patient denies any abdominal pain dysphagia nausea vomiting fevers chills black stools or bloody stools.  The patient will continue the medication.  The patient has been explained the plan and agrees with it.  She will get a repeat screening colonoscopy in 2028.  The patient has been explained the plan agrees with it.  Follow Up Instructions:  I discussed the assessment and treatment plan with the patient. The patient was provided an opportunity to ask questions and all were answered. The patient agreed with the plan and demonstrated an understanding of the instructions.   The patient was advised to call back or seek an in-person evaluation if the symptoms worsen or if the condition fails to improve as anticipated.  I provided 15 minutes of non-face-to-face time during this  encounter.   [name], MD  Speech recognition software was used to dictate the above note.

## 2022-02-20 DIAGNOSIS — G5701 Lesion of sciatic nerve, right lower limb: Secondary | ICD-10-CM | POA: Diagnosis not present

## 2022-02-20 DIAGNOSIS — M7061 Trochanteric bursitis, right hip: Secondary | ICD-10-CM | POA: Diagnosis not present

## 2022-02-25 ENCOUNTER — Other Ambulatory Visit: Payer: Self-pay | Admitting: Gastroenterology

## 2022-03-04 ENCOUNTER — Other Ambulatory Visit: Payer: Self-pay | Admitting: Family Medicine

## 2022-03-06 ENCOUNTER — Encounter: Payer: Self-pay | Admitting: Obstetrics and Gynecology

## 2022-03-06 ENCOUNTER — Ambulatory Visit (INDEPENDENT_AMBULATORY_CARE_PROVIDER_SITE_OTHER): Payer: BC Managed Care – PPO | Admitting: Obstetrics and Gynecology

## 2022-03-06 ENCOUNTER — Other Ambulatory Visit (HOSPITAL_COMMUNITY)
Admission: RE | Admit: 2022-03-06 | Discharge: 2022-03-06 | Disposition: A | Discharge status: Home / Self Care | Payer: BC Managed Care – PPO | Source: Ambulatory Visit | Attending: Obstetrics and Gynecology | Admitting: Obstetrics and Gynecology

## 2022-03-06 ENCOUNTER — Other Ambulatory Visit: Payer: Self-pay

## 2022-03-06 VITALS — BP 128/80 | Ht 64.0 in | Wt 216.0 lb

## 2022-03-06 DIAGNOSIS — Z01419 Encounter for gynecological examination (general) (routine) without abnormal findings: Secondary | ICD-10-CM | POA: Diagnosis not present

## 2022-03-06 DIAGNOSIS — Z1231 Encounter for screening mammogram for malignant neoplasm of breast: Secondary | ICD-10-CM

## 2022-03-06 DIAGNOSIS — Z124 Encounter for screening for malignant neoplasm of cervix: Secondary | ICD-10-CM | POA: Insufficient documentation

## 2022-03-06 DIAGNOSIS — N951 Menopausal and female climacteric states: Secondary | ICD-10-CM

## 2022-03-06 DIAGNOSIS — Z1151 Encounter for screening for human papillomavirus (HPV): Secondary | ICD-10-CM | POA: Insufficient documentation

## 2022-03-06 DIAGNOSIS — L738 Other specified follicular disorders: Secondary | ICD-10-CM | POA: Insufficient documentation

## 2022-03-06 DIAGNOSIS — Z803 Family history of malignant neoplasm of breast: Secondary | ICD-10-CM

## 2022-03-06 DIAGNOSIS — R0981 Nasal congestion: Secondary | ICD-10-CM

## 2022-03-06 DIAGNOSIS — Z9189 Other specified personal risk factors, not elsewhere classified: Secondary | ICD-10-CM | POA: Insufficient documentation

## 2022-03-06 HISTORY — DX: Other specified follicular disorders: L73.8

## 2022-03-06 MED ORDER — PROGESTERONE MICRONIZED 100 MG PO CAPS: 100.0000 mg | ORAL_CAPSULE | Freq: Every day | ORAL | 3 refills | Status: DC

## 2022-03-06 NOTE — Progress Notes (Signed)
? ?PCP: Leone Haven, MD ? ? ?Chief Complaint  ?Patient presents with  ? Gynecologic Exam  ?  External vaginal cyst, painful at times  ? ? ?HPI: ?     [name] is a 61 y.o. G0P0000 who LMP was No LMP recorded (lmp unknown). Patient is postmenopausal., presents today for her annual examination.  Her menses are absent due to menopause. She does not have PMB. She has occas vasomotor sx with daily prometrium use. Would like to stay on it.  ? ?Sex activity: not sexually active. She does not have vaginal dryness. ? ?Last Pap: 09/28/17  Results were: no abnormalities /neg HPV DNA.  ? ?Pt with infected "boil" 9/22 that improved with drainage. Pt states the area comes and goes. Sometimes bleeds a little when it drains. Hard to use warm compresses there.  ? ?Last mammogram: 12/27/21 Results were: normal--routine follow-up in 12 months ?There is a FH of breast cancer in her mom twice. Pt is MyRisk neg 2021; IBIS=26.6%. There is no FH of ovarian cancer. There is a FH of prostate and kidney cancer in her mat uncles. The patient does do self-breast exams. She has not had scr breast MRI.  ? ?Colonoscopy: 2016 and 2017 with Dr. Gustavo Lah;  Repeat 2021 with  Dr. Allen Norris, repeat due after 5 yrs ? ?Tobacco use: The patient denies current or previous tobacco use. ?Alcohol use: none  ?No drug use ?Exercise: mod active  ? ?She does get adequate calcium and Vitamin D in her diet. ? ?Labs with PCP. ?Normal DEXA 2/23 with PCP ? ?Pt feels like she has a sinus infection for 2-3 days. Has nasal congestion (can't breathe out of her nose, hard at night when has to use CPAP), itching RT ear, occas cough, and maxillary sinus pressure/feels swollen on RT side. Is using flonase and sudafed without relief; takes zyrtec nightly due to hx of allergies. Did amox for sinusitis with PCP 2/23 with some improvement, but sx back now. ? ?Patient Active Problem List  ? Diagnosis Date Noted  ? Increased risk of breast cancer 03/06/2022  ?  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 Strain of left wrist 05/17/2021  ? Acute gastritis without hemorrhage   ? Personal history of colonic polyps   ? Stress at work 06/22/2020  ? Obesity 02/19/2020  ? Right shoulder pain 02/16/2020  ? Family history of breast cancer 01/15/2020  ? Vasomotor symptoms due to menopause 01/15/2020  ? Sinus drainage 01/09/2020  ? Piriformis syndrome, left 05/08/2019  ? Bilateral leg edema 04/26/2018  ? OSA (obstructive sleep apnea) 04/26/2018  ? Back pain, chronic 07/06/2016  ? IBS (irritable bowel syndrome) 12/21/2015  ? GERD (gastroesophageal reflux disease) 04/21/2015  ? Type 2 diabetes mellitus ([place]) 01/20/2015  ? ? ?Past Surgical History:  ?Procedure Laterality Date  ? BILATERAL CARPAL TUNNEL RELEASE    ? BREAST EXCISIONAL BIOPSY Bilateral   ? benign  ? BREAST SURGERY    ? cervical neck fusion    ? CHOLECYSTECTOMY    ? COLONOSCOPY  2017  ? Dr. Gustavo Lah with polyp; repeat due 2021  ? COLONOSCOPY WITH PROPOFOL N/A 08/12/2020  ? Procedure: COLONOSCOPY WITH PROPOFOL;  Surgeon: [name], MD;  [facility];  Service: Endoscopy;  Laterality: N/A;  ? ENDOVENOUS ABLATION SAPHENOUS VEIN W/ LASER Left 04/15/2020  ? endovenous laser ablation left greater saphenous vein by Gae Gallop MD   ? ENDOVENOUS ABLATION SAPHENOUS VEIN W/ LASER Left 10/21/2020  ?  endovenous laser ablation left small saphenous vein by Gae Gallop MD   ? ESOPHAGOGASTRODUODENOSCOPY (EGD) WITH PROPOFOL N/A 05/12/2016  ? Procedure: ESOPHAGOGASTRODUODENOSCOPY (EGD) WITH PROPOFOL;  Surgeon: Lollie Sails, MD;  Location: [facility] END[facility]COPY;  Service: Endoscopy;  Laterality: N/A;  ? ESOPHAGOGASTRODUODENOSCOPY (EGD) WITH PROPOFOL N/A 08/12/2020  ? Procedure: ESOPHAGOGASTRODUODENOSCOPY (EGD) WITH PROPOFOL;  Surgeon: [name], MD;  Location[facility];  Service: Endoscopy;  Laterality: N/A;  ? ? ?Family History  ?Problem Relation Age of Onset  ? Breast cancer Mother   ?     twice--30s and 48  (bilat breasts)  ? Breast cancer Sister 75  ? Cancer Sister 65  ?     Blood cancer (unknown)  ? Hypertension Brother   ? Cancer Maternal Aunt   ?     Lung  ? Kidney cancer Maternal Uncle 30  ? Prostate cancer Maternal Uncle 79  ?     needed seed tx  ? Heart attack Paternal Uncle   ? Aneurysm Paternal Grandmother   ? Prostate cancer Cousin 20  ? Diabetes Neg Hx   ? ? ?Social History  ? ?Socioeconomic History  ? Marital status: Single  ?  Spouse name: Not on file  ? Number of children: Not on file  ? Years of education: Not on file  ? Highest education level: Not on file  ?Occupational History  ? Not on file  ?Tobacco Use  ? Smoking status: Never  ? Smokeless tobacco: Never  ?Vaping Use  ? Vaping Use: Never used  ?Substance and Sexual Activity  ? Alcohol use: No  ?  Alcohol/week: 0.0 standard drinks  ? Drug use: No  ? Sexual activity: Not Currently  ?  Birth control/protection: Post-menopausal  ?Other Topics Concern  ? Not on file  ?Social History Narrative  ? Works- [employer]   ? Mother lives with pt  ? No children   ? 1 dog lives inside   ? Right handed   ? Caffeine- 1 piece chocolate occasionally   ? Associate degree  ? Enjoys- gardening and going to concerts   ? ?Social Determinants of Health  ? ?Financial Resource Strain: Not on file  ?Food Insecurity: Not on file  ?Transportation Needs: Not on file  ?Physical Activity: Not on file  ?Stress: Not on file  ?Social Connections: Not on file  ?Intimate Partner Violence: Not on file  ? ? ? ?Current Outpatient Medications:  ?  BIOTIN PO, Take 1 tablet by mouth daily., Disp: , Rfl:  ?  Calcium-Magnesium-Vitamin D (CALCIUM 500 PO), Take by mouth., Disp: , Rfl:  ?  diclofenac (VOLTAREN) 50 MG EC tablet, Take 50 mg by mouth 2 (two) times daily., Disp: , Rfl:  ?  fluticasone (FLONASE) 50 MCG/ACT nasal spray, Place 1 spray into both nostrils 2 (two) times daily., Disp: , Rfl:  ?  glucose blood test strip, Dispense based on patient preference and insurance  preference, Use as once daily Dx: E11.9, Disp: 100 each, Rfl: 12 ?  JARDIANCE 10 MG TABS tablet, TAKE 1 TABLET BY MOUTH EVERY DAY, Disp: 30 tablet, Rfl: 4 ?  Lancet Devices (ONE TOUCH DELICA LANCING DEV) MISC, Use as directed Dx: E11.9, Disp: 1 each, Rfl: 0 ?  metFORMIN (GLUCOPHAGE-XR) 500 MG 24 hr tablet, TAKE 2 TABLETS  2 TIMES    DAILY WITH   MEALS, Disp: 360 tablet, Rfl: 1 ?  Multiple Vitamin (MULTIVITAMIN) capsule, Take 1 capsule by mouth daily., Disp: , Rfl:  ?  pantoprazole (PROTONIX) 40 MG tablet, TAKE ONE (1) TABLET BY MOUTH TWO TIMES PER DAY, Disp: 60 tablet, Rfl: 10 ?  pseudoephedrine (SUDAFED) 30 MG tablet, Take by mouth., Disp: , Rfl:  ?  vitamin B-12 (CYANOCOBALAMIN) 100 MCG tablet, Take 100 mcg by mouth daily., Disp: , Rfl:  ?  progesterone (PROMETRIUM) 100 MG capsule, Take 1 capsule (100 mg total) by mouth daily., Disp: 90 capsule, Rfl: 3 ?  traMADol (ULTRAM) 50 MG tablet, Take 50 mg by mouth every 6 (six) hours as needed. (Patient not taking: Reported on 03/06/2022), Disp: , Rfl:  ? ? ? ? ?ROS: ? ?Review of Systems  ?Constitutional:  Negative for fatigue, fever and unexpected weight change.  ?Respiratory:  Negative for cough, shortness of breath and wheezing.   ?Cardiovascular:  Negative for chest pain, palpitations and leg swelling.  ?Gastrointestinal:  Negative for blood in stool, constipation, diarrhea, nausea and vomiting.  ?Endocrine: Negative for cold intolerance, heat intolerance and polyuria.  ?Genitourinary:  Negative for dyspareunia, dysuria, flank pain, frequency, genital sores, hematuria, menstrual problem, pelvic pain, urgency, vaginal bleeding, vaginal discharge and vaginal pain.  ?Musculoskeletal:  Negative for back pain, joint swelling and myalgias.  ?Skin:  Negative for rash.  ?Neurological:  Negative for dizziness, syncope, light-headedness, numbness and headaches.  ?Hematological:  Negative for adenopathy.  ?Psychiatric/Behavioral:  Positive for agitation. Negative for confusion,  sleep disturbance and suicidal ideas. The patient is not nervous/anxious.   ?BREAST: No symptoms ? ? ? ?Objective: ?BP 128/80   Ht '5\' 4"'$  (1.626 m)   Wt 216 lb (98 kg)   LMP  (LMP Unknown) Comment: Patient states she h

## 2022-03-06 NOTE — Patient Instructions (Signed)
I value your feedback and you entrusting us with your care. If you get a [facility] patient survey, I would appreciate you taking the time to let us know about your experience today. Thank you! ? ? ?

## 2022-03-08 ENCOUNTER — Telehealth: Payer: Self-pay

## 2022-03-08 ENCOUNTER — Telehealth: Payer: Self-pay | Admitting: Family Medicine

## 2022-03-08 ENCOUNTER — Telehealth (INDEPENDENT_AMBULATORY_CARE_PROVIDER_SITE_OTHER): Payer: BC Managed Care – PPO | Admitting: Adult Health

## 2022-03-08 ENCOUNTER — Encounter: Payer: Self-pay | Admitting: Adult Health

## 2022-03-08 ENCOUNTER — Ambulatory Visit
Admission: RE | Admit: 2022-03-08 | Discharge: 2022-03-08 | Disposition: A | Discharge status: Home / Self Care | Payer: BC Managed Care – PPO | Source: Ambulatory Visit | Attending: Adult Health | Admitting: Adult Health

## 2022-03-08 VITALS — BP 134/97 | Temp 97.7°F | Wt 213.0 lb

## 2022-03-08 DIAGNOSIS — R052 Subacute cough: Secondary | ICD-10-CM | POA: Insufficient documentation

## 2022-03-08 DIAGNOSIS — R059 Cough, unspecified: Secondary | ICD-10-CM | POA: Diagnosis not present

## 2022-03-08 DIAGNOSIS — J0141 Acute recurrent pansinusitis: Secondary | ICD-10-CM | POA: Diagnosis not present

## 2022-03-08 DIAGNOSIS — R0600 Dyspnea, unspecified: Secondary | ICD-10-CM | POA: Diagnosis not present

## 2022-03-08 MED ORDER — PREDNISONE 10 MG (21) PO TBPK: ORAL_TABLET | ORAL | 0 refills | Status: DC

## 2022-03-08 MED ORDER — LEVOFLOXACIN 500 MG PO TABS: 500.0000 mg | ORAL_TABLET | Freq: Every day | ORAL | 0 refills | Status: DC

## 2022-03-08 MED ORDER — FLUTICASONE PROPIONATE 50 MCG/ACT NA SUSP: 1.0000 | Freq: Two times a day (BID) | NASAL | 1 refills | Status: DC

## 2022-03-08 MED ORDER — BENZONATATE 100 MG PO CAPS: 100.0000 mg | ORAL_CAPSULE | Freq: Two times a day (BID) | ORAL | 0 refills | Status: DC | PRN

## 2022-03-08 MED ORDER — CETIRIZINE HCL 10 MG PO TABS
10.0000 mg | ORAL_TABLET | Freq: Every day | ORAL | 1 refills | Status: AC
Start: 2022-03-08 — End: ?

## 2022-03-08 NOTE — Progress Notes (Signed)
Chest x ray without pneumonia.  ? I have sent Levaquin antibiotic for recurrent sinusitis, prednisone dose pack and tessalon for cough.  ?Also recommend trying zyrtec and flonase for sinus/ allergies per package instructions.  ?If no improvement within 3- 4 days see Leone Haven, MD ?

## 2022-03-08 NOTE — Telephone Encounter (Signed)
error 

## 2022-03-08 NOTE — Progress Notes (Signed)
Virtual Visit via Video Note ? ?I connected with Brittany Santos on 03/08/22 at 10:30 AM EDT by a video enabled telemedicine application and verified that I am speaking with the correct person using two identifiers. ? ?Location: ?Patient: at home ?Provider: Provider: Provider's office at  [facility], [place]. ?  ? ?  ?I discussed the limitations of evaluation and management by telemedicine and the availability of in person appointments. The patient expressed understanding and agreed to proceed. ? ?History of Present Illness: ?Patient is a 61 year old female in no acute distress who comes by virtual viist. She has a cough since January.  ?She was treated in February at [facility] and was treated with Augmentin 01/05/2022, she reports that she had persistent sinus drainage since then. She was also given cough syrup. Denies any chest pain, denies hemoptysis.  ?She feels she has had sinus pressure and congestion persistent since then she tells me.  ?She has fatigue reported as mild to moderate. Denies any distress. Denies wheezing.  ?Sinus pressure and pain in face.  ?Post nasal drip reported cough.  ? ?Patient  denies any fever, body aches,chills, rash, chest pain, shortness of breath, nausea, vomiting, or diarrhea.  ?Denies dizziness, lightheadedness, pre syncopal or syncopal episodes.  ? ? ?Observations/Objective: ? ? ?Patient is alert and oriented and responsive to questions Engages in conversation with provider. Speaks in full sentences without any pauses without any shortness of breath or distress.   ? ?Assessment and Plan: ? ? ?1. Subacute cough ?Chest  x ray to rule out pneumonia was done and is negative. This was done prior to sending any medication to pharmacy.  ?- DG Chest 2 View ?- CBC with Differential/Platelet; Future ?- Comprehensive metabolic panel; Future ?- COVID-19, Flu A+B and RSV ?- B Nat Peptide; Future ?- Culture, Group A Strep ?- benzonatate (TESSALON)  100 MG capsule; Take 1 capsule (100 mg total) by mouth 2 (two) times daily as needed for cough. ? ?2. Acute recurrent pansinusitis ?She reports symptoms of sinusitis since January when seen at [facility] was given Augmentin at that time. Will treat as recurrent sinusitis given symptoms, yogurt while on antibiotic and 1- 2 weeks after.  ?Also advised to start zyrtec and Flonase per package instructions. Recommend follow up with PCP if not improving at anytime.  ?Labs still pending results. Consider ENT if persistent after treatment.  ?Risks versus benefit and side effect of medication discussed.  ? ? ?- levofloxacin (LEVAQUIN) 500 MG tablet; Take 1 tablet (500 mg total) by mouth daily. ?- predniSONE (STERAPRED UNI-PAK 21 TAB) 10 MG (21) TBPK tablet; PO: Take 6 tablets on day 1:Take 5 tablets day 2:Take 4 tablets day 3: Take 3 tablets day 4:Take 2 tablets day five: Take 1 tablet day 6 ?- cetirizine (ZYRTEC) 10 MG tablet; Take 1 tablet (10 mg total) by mouth daily. ?- fluticasone (FLONASE) 50 MCG/ACT nasal spray; Place 1 spray into both nostrils 2 (two) times daily.  ? ?Follow Up Instructions: ? ?Advised in person evaluation at anytime is advised if any symptoms do not improve, worsen or change at any given time.  ?Red Flags discussed. The patient was given clear instructions to go to ER or return to medical center if any red flags develop, symptoms do not improve, worsen or new problems develop. They verbalized understanding.  ?Red Flags discussed. The patient was given clear instructions to go to ER or return to medical center if any red flags  develop, symptoms do not improve, worsen or new problems develop. They verbalized understanding. ? ?  ?I discussed the assessment and treatment plan with the patient. The patient was provided an opportunity to ask questions and all were answered. The patient agreed with the plan and demonstrated an understanding of the instructions. ?  ?The patient was advised to call back  or seek an in-person evaluation if the symptoms worsen or if the condition fails to improve as anticipated. ? ?I provided 24 minutes of non-face-to-face time during this encounter. ? ? ?Marcille Buffy, FNP  ?

## 2022-03-08 NOTE — Patient Instructions (Addendum)
?Advised in person evaluation at anytime is advised if any symptoms do not improve, worsen or change at any given time.  ?Red Flags discussed. The patient was given clear instructions to go to ER or return to medical center if any red flags develop, symptoms do not improve, worsen or new problems develop. They verbalized understanding.  ? ?Cough, Adult ?A cough helps to clear your throat and lungs. A cough may be a sign of an illness or another medical condition. ?An acute cough may only last 2-3 weeks, while a chronic cough may last 8 or more weeks. ?Many things can cause a cough. They include: ?Germs (viruses or bacteria) that attack the airway. ?Breathing in things that bother (irritate) your lungs. ?Allergies. ?Asthma. ?Mucus that runs down the back of your throat (postnasal drip). ?Smoking. ?Acid backing up from the stomach into the tube that moves food from the mouth to the stomach (gastroesophageal reflux). ?Some medicines. ?Lung problems. ?Other medical conditions, such as heart failure or a blood clot in the lung (pulmonary embolism). ?Follow these instructions at home: ?Medicines ?Take over-the-counter and prescription medicines only as told by your doctor. ?Talk with your doctor before you take medicines that stop a cough (cough suppressants). ?Lifestyle ? ?Do not smoke, and try not to be around smoke. Do not use any products that contain nicotine or tobacco, such as cigarettes, e-cigarettes, and chewing tobacco. If you need help quitting, ask your doctor. ?Drink enough fluid to keep your pee (urine) pale yellow. ?Avoid caffeine. ?Do not drink alcohol if your doctor tells you not to drink. ?General instructions ? ?Watch for any changes in your cough. Tell your doctor about them. ?Always cover your mouth when you cough. ?Stay away from things that make you cough, such as perfume, candles, campfire smoke, or cleaning products. ?If the air is dry, use a cool mist vaporizer or humidifier in your home. ?If  your cough is worse at night, try using extra pillows to raise your head up higher while you sleep. ?Rest as needed. ?Keep all follow-up visits as told by your doctor. This is important. ?Contact a doctor if: ?You have new symptoms. ?You cough up pus. ?Your cough does not get better after 2-3 weeks, or your cough gets worse. ?Cough medicine does not help your cough and you are not sleeping well. ?You have pain that gets worse or pain that is not helped with medicine. ?You have a fever. ?You are losing weight and you do not know why. ?You have night sweats. ?Get help right away if: ?You cough up blood. ?You have trouble breathing. ?Your heartbeat is very fast. ?These symptoms may be an emergency. Do not wait to see if the symptoms will go away. Get medical help right away. Call your local emergency services (911 in the [place] Do not drive yourself to the hospital. ?Summary ?A cough helps to clear your throat and lungs. Many things can cause a cough. ?Take over-the-counter and prescription medicines only as told by your doctor. ?Always cover your mouth when you cough. ?Contact a doctor if you have new symptoms or you have a cough that does not get better or gets worse. ?This information is not intended to replace advice given to you by your health care provider. Make sure you discuss any questions you have with your health care provider. ?Document Revised: 01/23/2020 Document Reviewed: 12/23/2018 ?Elsevier Patient Education ? [place]. ?Sinusitis, Adult ?Sinusitis is inflammation of your sinuses. Sinuses are hollow spaces in the bones  around your face. Your sinuses are located: ?Around your eyes. ?In the middle of your forehead. ?Behind your nose. ?In your cheekbones. ?Mucus normally drains out of your sinuses. When your nasal tissues become inflamed or swollen, mucus can become trapped or blocked. This allows bacteria, viruses, and fungi to grow, which leads to infection. Most infections of the sinuses are  caused by a virus. ?Sinusitis can develop quickly. It can last for up to 4 weeks (acute) or for more than 12 weeks (chronic). Sinusitis often develops after a cold. ?What are the causes? ?This condition is caused by anything that creates swelling in the sinuses or stops mucus from draining. This includes: ?Allergies. ?Asthma. ?Infection from bacteria or viruses. ?Deformities or blockages in your nose or sinuses. ?Abnormal growths in the nose (nasal polyps). ?Pollutants, such as chemicals or irritants in the air. ?Infection from fungi (rare). ?What increases the risk? ?You are more likely to develop this condition if you: ?Have a weak body defense system (immune system). ?Do a lot of swimming or diving. ?Overuse nasal sprays. ?Smoke. ?What are the signs or symptoms? ?The main symptoms of this condition are pain and a feeling of pressure around the affected sinuses. Other symptoms include: ?Stuffy nose or congestion. ?Thick drainage from your nose. ?Swelling and warmth over the affected sinuses. ?Headache. ?Upper toothache. ?A cough that may get worse at night. ?Extra mucus that collects in the throat or the back of the nose (postnasal drip). ?Decreased sense of smell and taste. ?Fatigue. ?A fever. ?Sore throat. ?Bad breath. ?How is this diagnosed? ?This condition is diagnosed based on: ?Your symptoms. ?Your medical history. ?A physical exam. ?Tests to find out if your condition is acute or chronic. This may include: ?Checking your nose for nasal polyps. ?Viewing your sinuses using a device that has a light (endoscope). ?Testing for allergies or bacteria. ?Imaging tests, such as an MRI or CT scan. ?In rare cases, a bone biopsy may be done to rule out more serious types of fungal sinus disease. ?How is this treated? ?Treatment for sinusitis depends on the cause and whether your condition is chronic or acute. ?If caused by a virus, your symptoms should go away on their own within 10 days. You may be given medicines to  relieve symptoms. They include: ?Medicines that shrink swollen nasal passages (topical intranasal decongestants). ?Medicines that treat allergies (antihistamines). ?A spray that eases inflammation of the nostrils (topical intranasal corticosteroids). ?Rinses that help get rid of thick mucus in your nose (nasal saline washes). ?If caused by bacteria, your health care provider may recommend waiting to see if your symptoms improve. Most bacterial infections will get better without antibiotic medicine. You may be given antibiotics if you have: ?A severe infection. ?A weak immune system. ?If caused by narrow nasal passages or nasal polyps, you may need to have surgery. ?Follow these instructions at home: ?Medicines ?Take, use, or apply over-the-counter and prescription medicines only as told by your health care provider. These may include nasal sprays. ?If you were prescribed an antibiotic medicine, take it as told by your health care provider. Do not stop taking the antibiotic even if you start to feel better. ?Hydrate and humidify ? ?Drink enough fluid to keep your urine pale yellow. Staying hydrated will help to thin your mucus. ?Use a cool mist humidifier to keep the humidity level in your home above 50%. ?Inhale steam for 10-15 minutes, 3-4 times a day, or as told by your health care provider. You can do this  in the bathroom while a hot shower is running. ?Limit your exposure to cool or dry air. ?Rest ?Rest as much as possible. ?Sleep with your head raised (elevated). ?Make sure you get enough sleep each night. ?General instructions ? ?Apply a warm, moist washcloth to your face 3-4 times a day or as told by your health care provider. This will help with discomfort. ?Wash your hands often with soap and water to reduce your exposure to germs. If soap and water are not available, use hand sanitizer. ?Do not smoke. Avoid being around people who are smoking (secondhand smoke). ?Keep all follow-up visits as told by your  health care provider. This is important. ?Contact a health care provider if: ?You have a fever. ?Your symptoms get worse. ?Your symptoms do not improve within 10 days. ?Get help right away if: ?You have a

## 2022-03-08 NOTE — Telephone Encounter (Signed)
-----   Message from Doreen Beam, [place] sent at 03/08/2022  3:39 PM EDT ----- ?Chest x ray without pneumonia.  ? I have sent Levaquin antibiotic for recurrent sinusitis, prednisone dose pack and tessalon for cough.  ?Also recommend trying zyrtec and flonase for sinus/ allergies per package instructions.  ?If no improvement within 3- 4 days see Leone Haven, MD ? ?

## 2022-03-09 LAB — CYTOLOGY - PAP
Comment: NEGATIVE
Diagnosis: UNDETERMINED — AB
High risk HPV: NEGATIVE

## 2022-03-10 LAB — COVID-19, FLU A+B AND RSV
Influenza A, NAA: NOT DETECTED
Influenza B, NAA: NOT DETECTED
RSV, NAA: NOT DETECTED
SARS-CoV-2, NAA: NOT DETECTED

## 2022-03-11 LAB — CULTURE, GROUP A STREP: Strep A Culture: NEGATIVE

## 2022-04-06 DIAGNOSIS — M5416 Radiculopathy, lumbar region: Secondary | ICD-10-CM | POA: Diagnosis not present

## 2022-04-06 DIAGNOSIS — M4807 Spinal stenosis, lumbosacral region: Secondary | ICD-10-CM | POA: Diagnosis not present

## 2022-04-25 DIAGNOSIS — M4807 Spinal stenosis, lumbosacral region: Secondary | ICD-10-CM | POA: Diagnosis not present

## 2022-04-25 DIAGNOSIS — M4856XA Collapsed vertebra, not elsewhere classified, lumbar region, initial encounter for fracture: Secondary | ICD-10-CM | POA: Diagnosis not present

## 2022-04-25 DIAGNOSIS — M47816 Spondylosis without myelopathy or radiculopathy, lumbar region: Secondary | ICD-10-CM | POA: Diagnosis not present

## 2022-05-10 ENCOUNTER — Ambulatory Visit: Payer: BC Managed Care – PPO | Admitting: Family Medicine

## 2022-05-21 ENCOUNTER — Other Ambulatory Visit: Payer: Self-pay | Admitting: Family Medicine

## 2022-05-22 ENCOUNTER — Ambulatory Visit (INDEPENDENT_AMBULATORY_CARE_PROVIDER_SITE_OTHER): Payer: BC Managed Care – PPO | Admitting: Family Medicine

## 2022-05-22 ENCOUNTER — Encounter: Payer: Self-pay | Admitting: Family Medicine

## 2022-05-22 DIAGNOSIS — G4733 Obstructive sleep apnea (adult) (pediatric): Secondary | ICD-10-CM | POA: Diagnosis not present

## 2022-05-22 DIAGNOSIS — M5441 Lumbago with sciatica, right side: Secondary | ICD-10-CM

## 2022-05-22 DIAGNOSIS — E119 Type 2 diabetes mellitus without complications: Secondary | ICD-10-CM

## 2022-05-22 DIAGNOSIS — G8929 Other chronic pain: Secondary | ICD-10-CM

## 2022-05-22 DIAGNOSIS — F320 Major depressive disorder, single episode, mild: Secondary | ICD-10-CM | POA: Insufficient documentation

## 2022-05-22 MED ORDER — DICLOFENAC SODIUM 50 MG PO TBEC: 50.0000 mg | DELAYED_RELEASE_TABLET | Freq: Two times a day (BID) | ORAL | 1 refills | Status: DC | PRN

## 2022-05-22 MED ORDER — TRAMADOL HCL 50 MG PO TABS: 50.0000 mg | ORAL_TABLET | Freq: Four times a day (QID) | ORAL | 0 refills | Status: DC | PRN

## 2022-05-22 NOTE — Assessment & Plan Note (Signed)
Reports compliance with her CPAP.  It is difficult to tell if her hypersomnia and lack of being rested in the morning is related to inadequately treated sleep apnea versus her depression and back pain.  We will request a compliance report.

## 2022-05-22 NOTE — Assessment & Plan Note (Signed)
Check A1c.  Continue metformin XR 1000 mg twice daily and Jardiance 10 mg daily.

## 2022-05-22 NOTE — Patient Instructions (Signed)
Nice to see you. I refilled your tramadol and diclofenac.  You should not take any over-the-counter NSAIDs such as Aleve or ibuprofen while taking the diclofenac. Please let us know if your depression worsens. We will get labs and let you know the results.

## 2022-05-22 NOTE — Assessment & Plan Note (Signed)
Chronic issue.  Her MRI is fairly unrevealing for cause of the pain radiating down her leg.  She reports she has follow-up with orthopedics in the near future.  She will keep that appointment.  I will refill her tramadol and diclofenac for as needed use.  She was advised not to take the diclofenac with ibuprofen or Aleve.

## 2022-05-22 NOTE — Progress Notes (Signed)
[name], MD Phone: [phone]  Brittany Santos is a 61 y.o. female who presents today for follow-up.  Diabetes: Typically running 130-140 though occasionally up to 160.  Taking metformin and Jardiance.  No polyuria or polydipsia.  No hypoglycemia.  Obstructive sleep apnea: She is wearing her CPAP 5 to 7 hours nightly.  She does report hypersomnia.  She does not wake up well rested.  Chronic back and right leg pain: Patient has seen pain management and orthopedics.  She had an MRI recently of her lumbar spine revealing mild disc and degenerative arthritic changes.  She had a chronic compression fracture at L2.  She gets pain radiating down her right leg anteriorly.  Motrin is somewhat helpful.  She has been on diclofenac though does not take it all the time.  She also takes tramadol occasionally with good benefit.  No side effects with this.  No history of seizures.  She tried gabapentin and notes that caused some balance issues.  She sees orthopedics next week.  They have also scheduled her for physical therapy.  She reports she had an MRI of her hip and notes a hip specialist advised it was not a hip issue.  Mild depression: Patient notes mild depression and stress with what is going on with her health as well as her mother's.  She notes no SI.  Social History   Tobacco Use  Smoking Status Never  Smokeless Tobacco Never    Current Outpatient Medications on File Prior to Visit  Medication Sig Dispense Refill   BIOTIN PO Take 1 tablet by mouth daily.     Calcium-Magnesium-Vitamin D (CALCIUM 500 PO) Take by mouth.     cetirizine (ZYRTEC) 10 MG tablet Take 1 tablet (10 mg total) by mouth daily. 30 tablet 1   fluticasone (FLONASE) 50 MCG/ACT nasal spray Place 1 spray into both nostrils 2 (two) times daily. 15.8 mL 1   glucose blood test strip Dispense based on patient preference and insurance preference, Use as once daily Dx: E11.9 100 each 12   JARDIANCE 10 MG TABS tablet  TAKE 1 TABLET BY MOUTH EVERY DAY 30 tablet 4   Lancet Devices (ONE TOUCH DELICA LANCING DEV) MISC Use as directed Dx: E11.9 1 each 0   metFORMIN (GLUCOPHAGE-XR) 500 MG 24 hr tablet TAKE 2 TABLETS  2 TIMES    DAILY WITH   MEALS 360 tablet 1   Multiple Vitamin (MULTIVITAMIN) capsule Take 1 capsule by mouth daily.     pantoprazole (PROTONIX) 40 MG tablet TAKE ONE (1) TABLET BY MOUTH TWO TIMES PER DAY 60 tablet 10   progesterone (PROMETRIUM) 100 MG capsule Take 1 capsule (100 mg total) by mouth daily. 90 capsule 3   pseudoephedrine (SUDAFED) 30 MG tablet Take by mouth.     vitamin B-12 (CYANOCOBALAMIN) 100 MCG tablet Take 100 mcg by mouth daily.     benzonatate (TESSALON) 100 MG capsule Take 1 capsule (100 mg total) by mouth 2 (two) times daily as needed for cough. (Patient not taking: Reported on 05/22/2022) 20 capsule 0   levofloxacin (LEVAQUIN) 500 MG tablet Take 1 tablet (500 mg total) by mouth daily. (Patient not taking: Reported on 05/22/2022) 7 tablet 0   predniSONE (STERAPRED UNI-PAK 21 TAB) 10 MG (21) TBPK tablet PO: Take 6 tablets on day 1:Take 5 tablets day 2:Take 4 tablets day 3: Take 3 tablets day 4:Take 2 tablets day five: Take 1 tablet day 6 (Patient not taking: Reported on 05/22/2022) 21 tablet  0   No current facility-administered medications on file prior to visit.     ROS see history of present illness  Objective  Physical Exam Vitals:   05/22/22 1439  BP: 118/70  Pulse: 90  Temp: 97.7 F (36.5 C)  SpO2: 93%    BP Readings from Last 3 Encounters:  05/22/22 118/70  03/08/22 (!) 134/97  03/06/22 128/80   Wt Readings from Last 3 Encounters:  05/22/22 214 lb 3.2 oz (97.2 kg)  03/08/22 213 lb (96.6 kg)  03/06/22 216 lb (98 kg)    Physical Exam Constitutional:      General: She is not in acute distress.    Appearance: She is not diaphoretic.  Cardiovascular:     Rate and Rhythm: Normal rate and regular rhythm.     Heart sounds: Normal heart sounds.  Pulmonary:      Effort: Pulmonary effort is normal.     Breath sounds: Normal breath sounds.  Skin:    General: Skin is warm and dry.  Neurological:     Mental Status: She is alert.     Comments: 5/5 strength bilateral quads, hamstrings, plantarflexion, and dorsiflexion, sensation light touch intact bilateral lower extremities     Assessment/Plan: Please see individual problem list.  Problem List Items Addressed This Visit     Back pain, chronic (Chronic)    Chronic issue.  Her MRI is fairly unrevealing for cause of the pain radiating down her leg.  She reports she has follow-up with orthopedics in the near future.  She will keep that appointment.  I will refill her tramadol and diclofenac for as needed use.  She was advised not to take the diclofenac with ibuprofen or Aleve.       Relevant Medications   traMADol (ULTRAM) 50 MG tablet   diclofenac (VOLTAREN) 50 MG EC tablet   OSA (obstructive sleep apnea) (Chronic)    Reports compliance with her CPAP.  It is difficult to tell if her hypersomnia and lack of being rested in the morning is related to inadequately treated sleep apnea versus her depression and back pain.  We will request a compliance report.       Type 2 diabetes mellitus (HCC) (Chronic)    Check A1c.  Continue metformin XR 1000 mg twice daily and Jardiance 10 mg daily.       Relevant Orders   Basic Metabolic Panel (BMET)   HgB A1c   Urine Microalbumin w/creat. ratio   Depression, major, single episode, mild ([place])    Discussed possible medication treatment for this though she declines this.  She will monitor and let us know if things worsen.        Return in about 3 months (around 08/22/2022).   [name], MD [place]

## 2022-05-22 NOTE — Assessment & Plan Note (Signed)
Discussed possible medication treatment for this though she declines this.  She will monitor and let us know if things worsen.

## 2022-05-23 ENCOUNTER — Other Ambulatory Visit: Payer: Self-pay

## 2022-05-23 DIAGNOSIS — E119 Type 2 diabetes mellitus without complications: Secondary | ICD-10-CM

## 2022-05-23 LAB — BASIC METABOLIC PANEL
BUN: 13 mg/dL (ref 6–23)
CO2: 28 mEq/L (ref 19–32)
Calcium: 9.9 mg/dL (ref 8.4–10.5)
Chloride: 103 mEq/L (ref 96–112)
Creatinine, Ser: 0.66 mg/dL (ref 0.40–1.20)
GFR: 94.13 mL/min (ref >60.00)
Glucose, Bld: 137 mg/dL — ABNORMAL HIGH (ref 70–99)
Potassium: 3.9 mEq/L (ref 3.5–5.1)
Sodium: 142 mEq/L (ref 135–145)

## 2022-05-23 LAB — HEMOGLOBIN A1C: Hgb A1c MFr Bld: 7 % — ABNORMAL HIGH (ref 4.6–6.5)

## 2022-05-23 LAB — MICROALBUMIN / CREATININE URINE RATIO
Creatinine,U: 54.6 mg/dL
Microalb Creat Ratio: 4.1 mg/g (ref 0.0–30.0)
Microalb, Ur: 2.3 mg/dL — ABNORMAL HIGH (ref 0.0–1.9)

## 2022-05-23 MED ORDER — EMPAGLIFLOZIN 25 MG PO TABS: 25.0000 mg | ORAL_TABLET | Freq: Every day | ORAL | 0 refills | Status: DC

## 2022-06-08 ENCOUNTER — Encounter: Payer: Self-pay | Admitting: Family Medicine

## 2022-06-08 DIAGNOSIS — M5416 Radiculopathy, lumbar region: Secondary | ICD-10-CM | POA: Diagnosis not present

## 2022-06-08 DIAGNOSIS — M7061 Trochanteric bursitis, right hip: Secondary | ICD-10-CM | POA: Diagnosis not present

## 2022-07-24 ENCOUNTER — Telehealth: Payer: Self-pay

## 2022-07-24 DIAGNOSIS — E119 Type 2 diabetes mellitus without complications: Secondary | ICD-10-CM

## 2022-07-24 NOTE — Telephone Encounter (Signed)
Patient states she has no refills left for her empagliflozin (JARDIANCE) 25 MG TABS tablet.  Patient states she would like to know if Dr. [name] would like for her to continue with the 25 MG tablets for this medication until her appointment with him on 09/05/2022, or if he would like for her to return to taking 10 MG.  Patient states she does still have refills for the 10 MG dosage, but will need a refill called in for her if she needs to stay on the 25 MG dose.  *Patient states her preferred pharmacy is [facility] in [place].

## 2022-07-25 MED ORDER — EMPAGLIFLOZIN 25 MG PO TABS: 25.0000 mg | ORAL_TABLET | Freq: Every day | ORAL | 3 refills | Status: AC

## 2022-07-25 NOTE — Telephone Encounter (Signed)
I sent the jardiance 25 mg to the pharmacy.

## 2022-07-31 DIAGNOSIS — M1712 Unilateral primary osteoarthritis, left knee: Secondary | ICD-10-CM | POA: Diagnosis not present

## 2022-08-16 DIAGNOSIS — R1314 Dysphagia, pharyngoesophageal phase: Secondary | ICD-10-CM | POA: Diagnosis not present

## 2022-08-16 DIAGNOSIS — J039 Acute tonsillitis, unspecified: Secondary | ICD-10-CM | POA: Diagnosis not present

## 2022-08-17 DIAGNOSIS — G4733 Obstructive sleep apnea (adult) (pediatric): Secondary | ICD-10-CM | POA: Diagnosis not present

## 2022-08-22 DIAGNOSIS — L570 Actinic keratosis: Secondary | ICD-10-CM | POA: Diagnosis not present

## 2022-08-22 DIAGNOSIS — L821 Other seborrheic keratosis: Secondary | ICD-10-CM | POA: Diagnosis not present

## 2022-08-22 DIAGNOSIS — L820 Inflamed seborrheic keratosis: Secondary | ICD-10-CM | POA: Diagnosis not present

## 2022-08-22 DIAGNOSIS — D229 Melanocytic nevi, unspecified: Secondary | ICD-10-CM | POA: Diagnosis not present

## 2022-08-22 DIAGNOSIS — Q828 Other specified congenital malformations of skin: Secondary | ICD-10-CM | POA: Diagnosis not present

## 2022-08-24 ENCOUNTER — Other Ambulatory Visit: Payer: Self-pay | Admitting: Otolaryngology

## 2022-08-24 ENCOUNTER — Other Ambulatory Visit: Payer: Self-pay | Admitting: Family Medicine

## 2022-08-24 DIAGNOSIS — D3702 Neoplasm of uncertain behavior of tongue: Secondary | ICD-10-CM

## 2022-08-24 DIAGNOSIS — R1314 Dysphagia, pharyngoesophageal phase: Secondary | ICD-10-CM | POA: Diagnosis not present

## 2022-08-24 DIAGNOSIS — J039 Acute tonsillitis, unspecified: Secondary | ICD-10-CM | POA: Diagnosis not present

## 2022-08-29 ENCOUNTER — Ambulatory Visit
Admission: RE | Admit: 2022-08-29 | Discharge: 2022-08-29 | Disposition: A | Discharge status: Home / Self Care | Payer: BC Managed Care – PPO | Source: Ambulatory Visit | Attending: Otolaryngology | Admitting: Otolaryngology

## 2022-08-29 DIAGNOSIS — R590 Localized enlarged lymph nodes: Secondary | ICD-10-CM | POA: Diagnosis not present

## 2022-08-29 DIAGNOSIS — D3702 Neoplasm of uncertain behavior of tongue: Secondary | ICD-10-CM

## 2022-08-29 DIAGNOSIS — C029 Malignant neoplasm of tongue, unspecified: Secondary | ICD-10-CM | POA: Diagnosis not present

## 2022-08-29 DIAGNOSIS — J358 Other chronic diseases of tonsils and adenoids: Secondary | ICD-10-CM | POA: Diagnosis not present

## 2022-08-29 MED ORDER — IOPAMIDOL (ISOVUE-300) INJECTION 61%
75.0000 mL | Freq: Once | INTRAVENOUS | Status: AC | PRN
Start: 2022-08-29 — End: 2022-08-29
  Administered 2022-08-29: 75 mL via INTRAVENOUS

## 2022-08-31 ENCOUNTER — Other Ambulatory Visit: Payer: BC Managed Care – PPO

## 2022-08-31 DIAGNOSIS — M25551 Pain in right hip: Secondary | ICD-10-CM | POA: Diagnosis not present

## 2022-08-31 DIAGNOSIS — M5441 Lumbago with sciatica, right side: Secondary | ICD-10-CM | POA: Diagnosis not present

## 2022-08-31 DIAGNOSIS — M1611 Unilateral primary osteoarthritis, right hip: Secondary | ICD-10-CM | POA: Diagnosis not present

## 2022-08-31 DIAGNOSIS — G8929 Other chronic pain: Secondary | ICD-10-CM | POA: Diagnosis not present

## 2022-09-01 DIAGNOSIS — D3705 Neoplasm of uncertain behavior of pharynx: Secondary | ICD-10-CM | POA: Diagnosis not present

## 2022-09-04 ENCOUNTER — Other Ambulatory Visit: Payer: Self-pay

## 2022-09-04 ENCOUNTER — Encounter
Admission: RE | Disposition: A | Discharge status: Home / Self Care | Payer: Self-pay | Source: Home / Self Care | Attending: Otolaryngology

## 2022-09-04 ENCOUNTER — Ambulatory Visit: Payer: BC Managed Care – PPO | Admitting: Certified Registered Nurse Anesthetist

## 2022-09-04 ENCOUNTER — Ambulatory Visit
Admission: RE | Admit: 2022-09-04 | Discharge: 2022-09-04 | Disposition: A | Discharge status: Home / Self Care | Payer: BC Managed Care – PPO | Attending: Otolaryngology | Admitting: Otolaryngology

## 2022-09-04 ENCOUNTER — Encounter: Payer: Self-pay | Admitting: Otolaryngology

## 2022-09-04 DIAGNOSIS — G4730 Sleep apnea, unspecified: Secondary | ICD-10-CM | POA: Diagnosis not present

## 2022-09-04 DIAGNOSIS — E119 Type 2 diabetes mellitus without complications: Secondary | ICD-10-CM | POA: Diagnosis not present

## 2022-09-04 DIAGNOSIS — Z7984 Long term (current) use of oral hypoglycemic drugs: Secondary | ICD-10-CM | POA: Diagnosis not present

## 2022-09-04 DIAGNOSIS — Z01818 Encounter for other preprocedural examination: Secondary | ICD-10-CM | POA: Diagnosis not present

## 2022-09-04 DIAGNOSIS — J3501 Chronic tonsillitis: Secondary | ICD-10-CM | POA: Diagnosis not present

## 2022-09-04 DIAGNOSIS — J351 Hypertrophy of tonsils: Secondary | ICD-10-CM | POA: Insufficient documentation

## 2022-09-04 DIAGNOSIS — D3705 Neoplasm of uncertain behavior of pharynx: Secondary | ICD-10-CM | POA: Diagnosis not present

## 2022-09-04 HISTORY — PX: TONSILLECTOMY: SHX5217

## 2022-09-04 LAB — GLUCOSE, CAPILLARY
Glucose-Capillary: 164 mg/dL — ABNORMAL HIGH (ref 70–99)
Glucose-Capillary: 151 mg/dL — ABNORMAL HIGH (ref 70–99)

## 2022-09-04 SURGERY — TONSILLECTOMY
Anesthesia: General | Laterality: Left

## 2022-09-04 MED ORDER — ORAL CARE MOUTH RINSE: 15.0000 mL | Freq: Once | OROMUCOSAL | Status: DC

## 2022-09-04 MED ORDER — DEXMEDETOMIDINE HCL IN NACL 200 MCG/50ML IV SOLN
INTRAVENOUS | Status: DC | PRN
  Administered 2022-09-04 (×2): 8 ug via INTRAVENOUS
  Administered 2022-09-04: 4 ug via INTRAVENOUS

## 2022-09-04 MED ORDER — 0.9 % SODIUM CHLORIDE (POUR BTL) OPTIME
TOPICAL | Status: DC | PRN
  Administered 2022-09-04: 500 mL

## 2022-09-04 MED ORDER — FENTANYL CITRATE (PF) 100 MCG/2ML IJ SOLN
INTRAMUSCULAR | Status: DC | PRN
  Administered 2022-09-04 (×2): 50 ug via INTRAVENOUS

## 2022-09-04 MED ORDER — LIDOCAINE HCL (CARDIAC) PF 100 MG/5ML IV SOSY
PREFILLED_SYRINGE | INTRAVENOUS | Status: DC | PRN
  Administered 2022-09-04: 80 mg via INTRAVENOUS

## 2022-09-04 MED ORDER — ACETAMINOPHEN 10 MG/ML IV SOLN
INTRAVENOUS | Status: AC
  Filled 2022-09-04: qty 100

## 2022-09-04 MED ORDER — ACETAMINOPHEN 10 MG/ML IV SOLN
INTRAVENOUS | Status: DC | PRN
  Administered 2022-09-04: 1000 mg via INTRAVENOUS

## 2022-09-04 MED ORDER — KETOROLAC TROMETHAMINE 30 MG/ML IJ SOLN
INTRAMUSCULAR | Status: DC | PRN
  Administered 2022-09-04: 30 mg via INTRAVENOUS

## 2022-09-04 MED ORDER — OXYCODONE HCL 5 MG/5ML PO SOLN: 5.0000 mg | Freq: Once | ORAL | Status: DC | PRN

## 2022-09-04 MED ORDER — LIDOCAINE VISCOUS HCL 2 % MT SOLN: 10.0000 mL | Freq: Four times a day (QID) | OROMUCOSAL | 0 refills | Status: DC | PRN

## 2022-09-04 MED ORDER — SODIUM CHLORIDE 0.9 % IV SOLN: INTRAVENOUS | Status: DC

## 2022-09-04 MED ORDER — OXYCODONE-ACETAMINOPHEN 5-325 MG/5ML PO SOLN: 5.0000 mL | Freq: Four times a day (QID) | ORAL | 0 refills | Status: DC | PRN

## 2022-09-04 MED ORDER — PROPOFOL 10 MG/ML IV BOLUS
INTRAVENOUS | Status: DC | PRN
  Administered 2022-09-04: 50 mg via INTRAVENOUS
  Administered 2022-09-04: 150 mg via INTRAVENOUS

## 2022-09-04 MED ORDER — SUCCINYLCHOLINE CHLORIDE 200 MG/10ML IV SOSY
PREFILLED_SYRINGE | INTRAVENOUS | Status: DC | PRN
  Administered 2022-09-04: 120 mg via INTRAVENOUS

## 2022-09-04 MED ORDER — BUPIVACAINE HCL 0.5 % IJ SOLN
INTRAMUSCULAR | Status: DC | PRN
  Administered 2022-09-04: 1.5 mL

## 2022-09-04 MED ORDER — MIDAZOLAM HCL 2 MG/2ML IJ SOLN
INTRAMUSCULAR | Status: DC | PRN
  Administered 2022-09-04: 2 mg via INTRAVENOUS

## 2022-09-04 MED ORDER — ONDANSETRON HCL 4 MG/2ML IJ SOLN
INTRAMUSCULAR | Status: AC
  Filled 2022-09-04: qty 2

## 2022-09-04 MED ORDER — CHLORHEXIDINE GLUCONATE 0.12 % MT SOLN: 15.0000 mL | Freq: Once | OROMUCOSAL | Status: DC

## 2022-09-04 MED ORDER — LIDOCAINE HCL (PF) 2 % IJ SOLN
INTRAMUSCULAR | Status: AC
  Filled 2022-09-04: qty 5

## 2022-09-04 MED ORDER — DEXAMETHASONE SODIUM PHOSPHATE 10 MG/ML IJ SOLN
INTRAMUSCULAR | Status: DC | PRN
  Administered 2022-09-04: 10 mg via INTRAVENOUS

## 2022-09-04 MED ORDER — FENTANYL CITRATE (PF) 100 MCG/2ML IJ SOLN
INTRAMUSCULAR | Status: AC
  Filled 2022-09-04: qty 2

## 2022-09-04 MED ORDER — ROCURONIUM BROMIDE 100 MG/10ML IV SOLN
INTRAVENOUS | Status: DC | PRN
  Administered 2022-09-04: 30 mg via INTRAVENOUS

## 2022-09-04 MED ORDER — PROPOFOL 10 MG/ML IV BOLUS
INTRAVENOUS | Status: AC
  Filled 2022-09-04: qty 20

## 2022-09-04 MED ORDER — MIDAZOLAM HCL 2 MG/2ML IJ SOLN
INTRAMUSCULAR | Status: AC
  Filled 2022-09-04: qty 2

## 2022-09-04 MED ORDER — BUPIVACAINE HCL (PF) 0.5 % IJ SOLN
INTRAMUSCULAR | Status: AC
  Filled 2022-09-04: qty 30

## 2022-09-04 MED ORDER — ONDANSETRON HCL 4 MG/2ML IJ SOLN
INTRAMUSCULAR | Status: DC | PRN
  Administered 2022-09-04: 4 mg via INTRAVENOUS

## 2022-09-04 MED ORDER — FENTANYL CITRATE (PF) 100 MCG/2ML IJ SOLN: 25.0000 ug | INTRAMUSCULAR | Status: DC | PRN

## 2022-09-04 MED ORDER — OXYCODONE HCL 5 MG PO TABS: 5.0000 mg | ORAL_TABLET | Freq: Once | ORAL | Status: DC | PRN

## 2022-09-04 MED ORDER — SUGAMMADEX SODIUM 200 MG/2ML IV SOLN
INTRAVENOUS | Status: DC | PRN
  Administered 2022-09-04: 400 mg via INTRAVENOUS

## 2022-09-04 SURGICAL SUPPLY — 18 items
BLADE BOVIE TIP EXT 4 (BLADE) ×1 IMPLANT
CATH ROBINSON RED A/P 10FR (CATHETERS) ×1 IMPLANT
CATH ROBINSON RED A/P 14FR (CATHETERS) ×1 IMPLANT
ELECT REM PT RETURN 9FT ADLT (ELECTROSURGICAL) ×1
ELECTRODE REM PT RTRN 9FT ADLT (ELECTROSURGICAL) ×1 IMPLANT
GAUZE 4X4 16PLY ~~LOC~~+RFID DBL (SPONGE) ×1 IMPLANT
GLOVE BIOGEL M STRL SZ7.5 (GLOVE) ×1 IMPLANT
HANDLE SUCTION POOLE (INSTRUMENTS) ×1 IMPLANT
KIT TURNOVER KIT A (KITS) ×1 IMPLANT
MANIFOLD NEPTUNE II (INSTRUMENTS) ×1 IMPLANT
NS IRRIG 500ML POUR BTL (IV SOLUTION) ×1 IMPLANT
PACK HEAD/NECK (MISCELLANEOUS) ×1 IMPLANT
SPONGE TONSIL 1 RF SGL (DISPOSABLE) ×1 IMPLANT
SUCTION COAG ELEC 10 HAND CTRL (ELECTROSURGICAL) ×1 IMPLANT
SUCTION POOLE HANDLE (INSTRUMENTS) ×1
SYR 3ML LL SCALE MARK (SYRINGE) ×1 IMPLANT
TRAP FLUID SMOKE EVACUATOR (MISCELLANEOUS) ×1 IMPLANT
WATER STERILE IRR 500ML POUR (IV SOLUTION) ×1 IMPLANT

## 2022-09-04 NOTE — H&P (Signed)
..  History and Physical paper copy reviewed and updated date of procedure and will be scanned into system.  Patient marked on left side.

## 2022-09-04 NOTE — Anesthesia Preprocedure Evaluation (Signed)
Anesthesia Evaluation  Patient identified by MRN, date of birth, ID band Patient awake    Reviewed: Allergy & Precautions, NPO status , Patient's Chart, lab work & pertinent test results  History of Anesthesia Complications Negative for: history of anesthetic complications  Airway Mallampati: I  TM Distance: >3 FB Neck ROM: Full    Dental no notable dental hx.    Pulmonary sleep apnea and Continuous Positive Airway Pressure Ventilation ,    Pulmonary exam normal breath sounds clear to auscultation       Cardiovascular Exercise Tolerance: Good negative cardio ROS Normal cardiovascular exam Rhythm:Regular Rate:Normal     Neuro/Psych negative neurological ROS  negative psych ROS   GI/Hepatic Neg liver ROS, GERD  ,  Endo/Other  negative endocrine ROSdiabetes, Well Controlled, Oral Hypoglycemic Agents  Renal/GU negative Renal ROS  negative genitourinary   Musculoskeletal  (+) Arthritis , R leg - sciatica    Abdominal   Peds negative pediatric ROS (+)  Hematology negative hematology ROS (+)   Anesthesia Other Findings   Reproductive/Obstetrics negative OB ROS                             Anesthesia Physical Anesthesia Plan  ASA: 2  Anesthesia Plan: General ETT   Post-op Pain Management: Toradol IV (intra-op)*, Ofirmev IV (intra-op)* and Dilaudid IV   Induction: Intravenous  PONV Risk Score and Plan: 4 or greater and Ondansetron, Dexamethasone, Midazolam and Treatment may vary due to age or medical condition  Airway Management Planned: Oral ETT  Additional Equipment:   Intra-op Plan:   Post-operative Plan: Extubation in OR  Informed Consent: I have reviewed the patients History and Physical, chart, labs and discussed the procedure including the risks, benefits and alternatives for the proposed anesthesia with the patient or authorized representative who has indicated his/her  understanding and acceptance.     Dental Advisory Given  Plan Discussed with: Anesthesiologist, CRNA and Surgeon  Anesthesia Plan Comments: (Patient consented for risks of anesthesia including but not limited to:  - adverse reactions to medications - damage to eyes, teeth, lips or other oral mucosa - nerve damage due to positioning  - sore throat or hoarseness - Damage to heart, brain, nerves, lungs, other parts of body or loss of life  Patient voiced understanding.)        Anesthesia Quick Evaluation

## 2022-09-04 NOTE — Discharge Instructions (Addendum)
AMBULATORY SURGERY  DISCHARGE INSTRUCTIONS   The drugs that you were given will stay in your system until tomorrow so for the next 24 hours you should not:  Drive an automobile Make any legal decisions Drink any alcoholic beverage   SOFT DIET - NO HARD OR CRUNCHY FOODS PER MD INSTRUCTIONS.   Please notify your doctor immediately if you have any unusual bleeding, trouble breathing, redness and pain at the surgery site, drainage, fever, or pain not relieved by medication.    Additional Instructions:  Please contact your physician with any problems or Same Day Surgery at [phone], Monday through Friday 6 am to 4 pm, or [place] at [facility] number at [phone].

## 2022-09-04 NOTE — Anesthesia Postprocedure Evaluation (Signed)
Anesthesia Post Note  Patient: Brittany Santos [id number]  Procedure(s) Performed: PARTIAL TONSILLECTOMY (Left)  Patient location during evaluation: PACU Anesthesia Type: General Level of consciousness: awake and alert Pain management: pain level controlled Vital Signs Assessment: post-procedure vital signs reviewed and stable Respiratory status: spontaneous breathing, nonlabored ventilation, respiratory function stable and patient connected to nasal cannula oxygen Cardiovascular status: blood pressure returned to baseline and stable Postop Assessment: no apparent nausea or vomiting Anesthetic complications: no   No notable events documented.   Last Vitals:  Vitals:   09/04/22 1310 09/04/22 1315  BP: 115/62   Pulse: 70 66  Resp: 13 14  Temp:    SpO2: 96% 97%    Last Pain:  Vitals:   09/04/22 1310  TempSrc:   PainSc: 0-No pain                 Ilene Qua

## 2022-09-04 NOTE — Op Note (Signed)
..  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    Procedure: After the patient was identified in holding and the history and physical and consent was reviewed, the patient was taken to the operating room and placed in a supine position.  General endotracheal anesthesia was induced in the normal fashion.  At this time, the patient was rotated 45 degrees and a shoulder roll was placed.  At this time, a McIvor mouthgag was inserted into the patient's oral cavity and suspended from the MChambersstand without injury to teeth, lips, or gums.  Visualization of the patient's oropharynx was made with 532mzero degree endoscope.  This demonstrated crypts and tonsilliths in left inferior tonsil pole with ulceration seen on CT scan consistent with large hypertrophied crypt.  Next a curved Alice clamp was attached to the patient's Left inferior tonsillar pole and retracted medially and inferiorly.  A Bovie electrocautery was used to dissect the patient's LEFT tonsil in a subcapsular plane.  This was continued superiorly to the superior pole as the crypts were contiguous.  Meticulous hemostasis was achieved with Bovie suction cautery.  At this time, the mouth gag was released from suspension for 1 minute.  At this time, the patient's nasal cavity and oral cavity was irrigated with sterile saline.  1.62m74mf 0.5% Marcaine was injected into the anterior and posterior tonsillar fossa on the patient's left side.  Following this  The care of patient was returned to anesthesia, awakened, and transferred to recovery in stable condition.  Dispo:  PACU to home  Plan: Soft diet.  Limit exercise and strenuous activity for 2 weeks.  Fluid hydration  Recheck my office next  week.   CreJeannie Fendught 12:33 PM 09/04/2022

## 2022-09-04 NOTE — Anesthesia Procedure Notes (Signed)
Procedure Name: Intubation Date/Time: 09/04/2022 12:06 PM  Performed by: Tollie Eth, CRNAPre-anesthesia Checklist: Patient identified, Patient being monitored, Timeout performed, Emergency Drugs available and Suction available Patient Re-evaluated:Patient Re-evaluated prior to induction Oxygen Delivery Method: Circle system utilized Preoxygenation: Pre-oxygenation with 100% oxygen Induction Type: IV induction Ventilation: Mask ventilation without difficulty Laryngoscope Size: 3 and McGraph Grade View: Grade I Tube type: Oral Rae Tube size: 6.5 mm Number of attempts: 1 Airway Equipment and Method: Stylet Placement Confirmation: ETT inserted through vocal cords under direct vision, positive ETCO2 and breath sounds checked- equal and bilateral Secured at: 21 cm Tube secured with: Tape Dental Injury: Teeth and Oropharynx as per pre-operative assessment

## 2022-09-04 NOTE — Transfer of Care (Signed)
Immediate Anesthesia Transfer of Care Note  Patient: Brittany Santos  Procedure(s) Performed: PARTIAL TONSILLECTOMY (Left)  Patient Location: PACU  Anesthesia Type:General  Level of Consciousness: drowsy  Airway & Oxygen Therapy: Patient Spontanous Breathing and Patient connected to face mask oxygen  Post-op Assessment: Report given to RN and Post -op Vital signs reviewed and stable  Post vital signs: Reviewed and stable  Last Vitals:  Vitals Value Taken Time  BP 110/63 09/04/22 1247  Temp    Pulse 72 09/04/22 1250  Resp 16 09/04/22 1250  SpO2 98 % 09/04/22 1250  Vitals shown include unvalidated device data.  Last Pain:  Vitals:   09/04/22 1032  TempSrc: Temporal  PainSc: 5       Patients Stated Pain Goal: 0 (02/40/97 3532)  Complications: No notable events documented.

## 2022-09-05 ENCOUNTER — Encounter: Payer: Self-pay | Admitting: Otolaryngology

## 2022-09-05 ENCOUNTER — Ambulatory Visit: Payer: BC Managed Care – PPO | Admitting: Family Medicine

## 2022-09-06 LAB — SURGICAL PATHOLOGY

## 2022-09-07 ENCOUNTER — Other Ambulatory Visit: Payer: BC Managed Care – PPO

## 2022-09-13 ENCOUNTER — Ambulatory Visit: Payer: BC Managed Care – PPO | Admitting: Family Medicine

## 2022-09-16 DIAGNOSIS — G4733 Obstructive sleep apnea (adult) (pediatric): Secondary | ICD-10-CM | POA: Diagnosis not present

## 2022-09-19 DIAGNOSIS — M1611 Unilateral primary osteoarthritis, right hip: Secondary | ICD-10-CM | POA: Diagnosis not present

## 2022-09-21 DIAGNOSIS — M25551 Pain in right hip: Secondary | ICD-10-CM | POA: Diagnosis not present

## 2022-09-21 DIAGNOSIS — G8929 Other chronic pain: Secondary | ICD-10-CM | POA: Diagnosis not present

## 2022-09-21 DIAGNOSIS — M5441 Lumbago with sciatica, right side: Secondary | ICD-10-CM | POA: Diagnosis not present

## 2022-09-21 DIAGNOSIS — M1611 Unilateral primary osteoarthritis, right hip: Secondary | ICD-10-CM | POA: Diagnosis not present

## 2022-10-17 DIAGNOSIS — G4733 Obstructive sleep apnea (adult) (pediatric): Secondary | ICD-10-CM | POA: Diagnosis not present

## 2022-10-25 ENCOUNTER — Ambulatory Visit: Payer: BC Managed Care – PPO | Admitting: Family Medicine

## 2022-11-10 ENCOUNTER — Other Ambulatory Visit: Payer: Self-pay | Admitting: Family Medicine

## 2022-11-13 ENCOUNTER — Ambulatory Visit (INDEPENDENT_AMBULATORY_CARE_PROVIDER_SITE_OTHER): Payer: BC Managed Care – PPO | Admitting: Family Medicine

## 2022-11-13 VITALS — BP 134/82 | HR 80 | Temp 97.6°F | Ht 64.0 in | Wt 209.6 lb

## 2022-11-13 DIAGNOSIS — M5441 Lumbago with sciatica, right side: Secondary | ICD-10-CM

## 2022-11-13 DIAGNOSIS — M25512 Pain in left shoulder: Secondary | ICD-10-CM | POA: Insufficient documentation

## 2022-11-13 DIAGNOSIS — R030 Elevated blood-pressure reading, without diagnosis of hypertension: Secondary | ICD-10-CM | POA: Diagnosis not present

## 2022-11-13 DIAGNOSIS — E119 Type 2 diabetes mellitus without complications: Secondary | ICD-10-CM | POA: Diagnosis not present

## 2022-11-13 DIAGNOSIS — G8929 Other chronic pain: Secondary | ICD-10-CM

## 2022-11-13 LAB — LIPID PANEL
Cholesterol: 139 mg/dL (ref 0–200)
HDL: 62.6 mg/dL (ref >39.00)
LDL Cholesterol: 60 mg/dL (ref 0–99)
NonHDL: 76.77
Total CHOL/HDL Ratio: 2
Triglycerides: 85 mg/dL (ref 0.0–149.0)
VLDL: 17 mg/dL (ref 0.0–40.0)

## 2022-11-13 LAB — COMPREHENSIVE METABOLIC PANEL
ALT: 20 U/L (ref 0–35)
AST: 20 U/L (ref 0–37)
Albumin: 4.4 g/dL (ref 3.5–5.2)
Alkaline Phosphatase: 61 U/L (ref 39–117)
BUN: 16 mg/dL (ref 6–23)
CO2: 30 mEq/L (ref 19–32)
Calcium: 9.6 mg/dL (ref 8.4–10.5)
Chloride: 97 mEq/L (ref 96–112)
Creatinine, Ser: 0.69 mg/dL (ref 0.40–1.20)
GFR: 92.82 mL/min (ref >60.00)
Glucose, Bld: 159 mg/dL — ABNORMAL HIGH (ref 70–99)
Potassium: 3.6 mEq/L (ref 3.5–5.1)
Sodium: 135 mEq/L (ref 135–145)
Total Bilirubin: 0.6 mg/dL (ref 0.2–1.2)
Total Protein: 7 g/dL (ref 6.0–8.3)

## 2022-11-13 LAB — HEMOGLOBIN A1C: Hgb A1c MFr Bld: 6.8 % — ABNORMAL HIGH (ref 4.6–6.5)

## 2022-11-13 MED ORDER — GABAPENTIN 100 MG PO CAPS: ORAL_CAPSULE | ORAL | 3 refills | Status: DC

## 2022-11-13 NOTE — Progress Notes (Signed)
Brittany Rumps, MD Phone: [phone]  Brittany Santos is a 61 y.o. female who presents today for f/u.  DIABETES Disease Monitoring: Blood Sugar ranges-<130 Polyuria/phagia/dipsia- no      Optho- UTD Medications: Compliance- taking metformin Hypoglycemic symptoms- no  Elevated blood pressure: Patient reports her blood pressures typically 118-120/60s at home.  She had a lot of stress on the way over here.  No chest pain or shortness of breath.  Sciatica/chronic hip pain: Patient has received 2 injections in her hip without much improvement.  Her specialist said there was nothing else they could do for her and they opted to refer her to pain management.  The patient in the past was on gabapentin though she is on 300 mg twice daily and that may have been too high of a dose.  She wonders if we could try gabapentin again before her seeing a pain specialist.  Left shoulder pain: Patient reports she got her flu vaccine about 3 weeks ago.  There is some discomfort when she got it and since then she has had a hard time abducting her left shoulder.   Social History   Tobacco Use  Smoking Status Never  Smokeless Tobacco Never    Current Outpatient Medications on File Prior to Visit  Medication Sig Dispense Refill   BIOTIN PO Take 1 tablet by mouth daily.     Calcium-Magnesium-Vitamin D (CALCIUM 500 PO) Take by mouth.     cetirizine (ZYRTEC) 10 MG tablet Take 1 tablet (10 mg total) by mouth daily. 30 tablet 1   fluticasone (FLONASE) 50 MCG/ACT nasal spray Place 1 spray into both nostrils 2 (two) times daily. 15.8 mL 1   glucose blood test strip Dispense based on patient preference and insurance preference, Use as once daily Dx: E11.9 100 each 12   Lancet Devices (ONE TOUCH DELICA LANCING DEV) MISC Use as directed Dx: E11.9 1 each 0   metFORMIN (GLUCOPHAGE-XR) 500 MG 24 hr tablet TAKE 2 TABLETS 2 TIMES     DAILY WITH MEALS 360 tablet 1   Multiple Vitamin (MULTIVITAMIN) capsule Take 1  capsule by mouth daily.     pantoprazole (PROTONIX) 40 MG tablet TAKE ONE (1) TABLET BY MOUTH TWO TIMES PER DAY 60 tablet 10   progesterone (PROMETRIUM) 100 MG capsule Take 1 capsule (100 mg total) by mouth daily. 90 capsule 3   lidocaine (XYLOCAINE) 2 % solution Use as directed 10 mLs in the mouth or throat every 6 (six) hours as needed for mouth pain (Swish and spit). (Patient not taking: Reported on 11/13/2022) 250 mL 0   oxyCODONE-acetaminophen (ROXICET) 5-325 MG/5ML solution Take 5 mLs by mouth every 6 (six) hours as needed for severe pain. (Patient not taking: Reported on 11/13/2022) 100 mL 0   vitamin B-12 (CYANOCOBALAMIN) 100 MCG tablet Take 100 mcg by mouth daily. (Patient not taking: Reported on 11/13/2022)     No current facility-administered medications on file prior to visit.     ROS see history of present illness  Objective  Physical Exam Vitals:   11/13/22 1120 11/13/22 1121  BP: (!) 152/88 134/82  Pulse: 80   Temp: 97.6 F (36.4 C)   SpO2: 98%     BP Readings from Last 3 Encounters:  11/13/22 134/82  09/04/22 (!) 147/69  05/22/22 118/70   Wt Readings from Last 3 Encounters:  11/13/22 209 lb 9.6 oz (95.1 kg)  09/04/22 211 lb (95.7 kg)  05/22/22 214 lb 3.2 oz (97.2 kg)  Physical Exam Constitutional:      General: She is not in acute distress.    Appearance: She is not diaphoretic.  Cardiovascular:     Rate and Rhythm: Normal rate and regular rhythm.     Heart sounds: Normal heart sounds.  Pulmonary:     Effort: Pulmonary effort is normal.     Breath sounds: Normal breath sounds.  Musculoskeletal:     Comments: Some tenderness over her left deltoid, discomfort on active abduction of her left shoulder, no overlying skin changes over her left deltoid, her left deltoid does feel little warmer than the right  Skin:    General: Skin is warm and dry.  Neurological:     Mental Status: She is alert.    Diabetic Foot Exam - Simple   Simple Foot  Form Diabetic Foot exam was performed with the following findings: Yes 11/13/2022 11:20 AM  Visual Inspection No deformities, no ulcerations, no other skin breakdown bilaterally: Yes Sensation Testing Intact to touch and monofilament testing bilaterally: Yes Pulse Check Posterior Tibialis and Dorsalis pulse intact bilaterally: Yes Comments      Assessment/Plan: Please see individual problem list.  Problem List Items Addressed This Visit     Back pain, chronic (Chronic)    Patient with reported sciatica-like symptoms.  Orthopedics has released her at this point.  We can try gabapentin 100 mg nightly and increase to 100 mg twice daily after 1 week.  If she is able to tolerate this and this is not beneficial we can increase it further in the future.  She will monitor for drowsiness with this medication.      Relevant Medications   gabapentin (NEURONTIN) 100 MG capsule   Type 2 diabetes mellitus (HCC) (Chronic)    Check A1c.  Continue metformin XR 1000 mg twice daily.      Relevant Orders   Lipid panel   Comp Met (CMET)   HgB A1c   Elevated blood pressure reading - Primary    Likely related to stressors on her drive to the office.  Has been well-controlled at home.  She will continue to monitor and if it trends up she will let us know.      Left shoulder pain    I suspect injury to underlying tissues from her flu vaccine.  Discussed it is possible that they hit the joint or  rotator cuff structures.  Will refer to sports medicine for further evaluation.      Relevant Orders   Ambulatory referral to Sports Medicine     Health Maintenance: Patient defers RSV vaccine until her left shoulder has improved  Return in about 3 months (around 02/13/2023) for DM.   Brittany Rumps, MD [place]

## 2022-11-13 NOTE — Assessment & Plan Note (Signed)
Patient with reported sciatica-like symptoms.  Orthopedics has released her at this point.  We can try gabapentin 100 mg nightly and increase to 100 mg twice daily after 1 week.  If she is able to tolerate this and this is not beneficial we can increase it further in the future.  She will monitor for drowsiness with this medication.

## 2022-11-13 NOTE — Assessment & Plan Note (Signed)
Check A1c.  Continue metformin XR 1000 mg twice daily.

## 2022-11-13 NOTE — Assessment & Plan Note (Addendum)
I suspect injury to underlying tissues from her flu vaccine.  Discussed it is possible that they hit the joint or  rotator cuff structures.  Will refer to sports medicine for further evaluation.

## 2022-11-13 NOTE — Assessment & Plan Note (Signed)
Likely related to stressors on her drive to the office.  Has been well-controlled at home.  She will continue to monitor and if it trends up she will let us know.

## 2022-11-14 NOTE — Progress Notes (Unsigned)
    [place]any [place] Phone: [phone]   Assessment and Plan:     There are no diagnoses linked to this encounter.  ***   Pertinent previous records reviewed include ***   Follow Up: ***     Subjective:   I, Brittany Santos, am serving as a Education administrator for Doctor Glennon Mac  Chief Complaint: left shoulder pain   HPI:   11/15/2022 Patient is a 61 year old female complaining of left shoulder pain. Patient states   Relevant Historical Information: ***  Additional pertinent review of systems negative.   Current Outpatient Medications:    BIOTIN PO, Take 1 tablet by mouth daily., Disp: , Rfl:    Calcium-Magnesium-Vitamin D (CALCIUM 500 PO), Take by mouth., Disp: , Rfl:    cetirizine (ZYRTEC) 10 MG tablet, Take 1 tablet (10 mg total) by mouth daily., Disp: 30 tablet, Rfl: 1   fluticasone (FLONASE) 50 MCG/ACT nasal spray, Place 1 spray into both nostrils 2 (two) times daily., Disp: 15.8 mL, Rfl: 1   gabapentin (NEURONTIN) 100 MG capsule, Take 1 capsule (100 mg total) by mouth at bedtime for 7 days, THEN 1 capsule (100 mg total) 2 (two) times daily., Disp: 180 capsule, Rfl: 3   glucose blood test strip, Dispense based on patient preference and insurance preference, Use as once daily Dx: E11.9, Disp: 100 each, Rfl: 12   Lancet Devices (ONE TOUCH DELICA LANCING DEV) MISC, Use as directed Dx: E11.9, Disp: 1 each, Rfl: 0   lidocaine (XYLOCAINE) 2 % solution, Use as directed 10 mLs in the mouth or throat every 6 (six) hours as needed for mouth pain (Swish and spit). (Patient not taking: Reported on 11/13/2022), Disp: 250 mL, Rfl: 0   metFORMIN (GLUCOPHAGE-XR) 500 MG 24 hr tablet, TAKE 2 TABLETS 2 TIMES     DAILY WITH MEALS, Disp: 360 tablet, Rfl: 1   Multiple Vitamin (MULTIVITAMIN) capsule, Take 1 capsule by mouth daily., Disp: , Rfl:    oxyCODONE-acetaminophen (ROXICET) 5-325 MG/5ML solution, Take 5 mLs  by mouth every 6 (six) hours as needed for severe pain. (Patient not taking: Reported on 11/13/2022), Disp: 100 mL, Rfl: 0   pantoprazole (PROTONIX) 40 MG tablet, TAKE ONE (1) TABLET BY MOUTH TWO TIMES PER DAY, Disp: 60 tablet, Rfl: 10   progesterone (PROMETRIUM) 100 MG capsule, Take 1 capsule (100 mg total) by mouth daily., Disp: 90 capsule, Rfl: 3   vitamin B-12 (CYANOCOBALAMIN) 100 MCG tablet, Take 100 mcg by mouth daily. (Patient not taking: Reported on 11/13/2022), Disp: , Rfl:    Objective:     There were no vitals filed for this visit.    There is no height or weight on file to calculate BMI.    Physical Exam:    ***   Electronically signed by:  Brittany SantosMarguerita Merles Sports Medicine 3:41 PM 11/14/22

## 2022-11-15 ENCOUNTER — Ambulatory Visit (INDEPENDENT_AMBULATORY_CARE_PROVIDER_SITE_OTHER): Payer: BC Managed Care – PPO

## 2022-11-15 ENCOUNTER — Ambulatory Visit (INDEPENDENT_AMBULATORY_CARE_PROVIDER_SITE_OTHER): Payer: BC Managed Care – PPO | Admitting: Sports Medicine

## 2022-11-15 VITALS — BP 138/80 | HR 84 | Ht 64.0 in | Wt 209.0 lb

## 2022-11-15 DIAGNOSIS — M25512 Pain in left shoulder: Secondary | ICD-10-CM

## 2022-11-15 DIAGNOSIS — M7552 Bursitis of left shoulder: Secondary | ICD-10-CM

## 2022-11-15 NOTE — Patient Instructions (Addendum)
Good to see you  Shoulder HEP  3-4 week follow up

## 2022-11-24 DIAGNOSIS — G4733 Obstructive sleep apnea (adult) (pediatric): Secondary | ICD-10-CM | POA: Diagnosis not present

## 2022-11-28 ENCOUNTER — Encounter: Payer: Self-pay | Admitting: Family Medicine

## 2022-12-06 ENCOUNTER — Ambulatory Visit: Payer: BC Managed Care – PPO | Admitting: Sports Medicine

## 2022-12-25 ENCOUNTER — Other Ambulatory Visit: Payer: Self-pay | Admitting: Gastroenterology

## 2022-12-25 DIAGNOSIS — G4733 Obstructive sleep apnea (adult) (pediatric): Secondary | ICD-10-CM | POA: Diagnosis not present

## 2022-12-31 DIAGNOSIS — M25572 Pain in left ankle and joints of left foot: Secondary | ICD-10-CM | POA: Diagnosis not present

## 2023-01-17 DIAGNOSIS — M542 Cervicalgia: Secondary | ICD-10-CM | POA: Diagnosis not present

## 2023-01-17 DIAGNOSIS — R519 Headache, unspecified: Secondary | ICD-10-CM | POA: Diagnosis not present

## 2023-01-22 ENCOUNTER — Encounter: Payer: Self-pay | Admitting: Family Medicine

## 2023-01-22 ENCOUNTER — Ambulatory Visit (INDEPENDENT_AMBULATORY_CARE_PROVIDER_SITE_OTHER): Payer: BC Managed Care – PPO | Admitting: Family Medicine

## 2023-01-22 VITALS — BP 130/80 | HR 98 | Temp 97.8°F | Ht 64.0 in | Wt 207.2 lb

## 2023-01-22 DIAGNOSIS — E119 Type 2 diabetes mellitus without complications: Secondary | ICD-10-CM

## 2023-01-22 DIAGNOSIS — Z1321 Encounter for screening for nutritional disorder: Secondary | ICD-10-CM | POA: Insufficient documentation

## 2023-01-22 DIAGNOSIS — E559 Vitamin D deficiency, unspecified: Secondary | ICD-10-CM

## 2023-01-22 DIAGNOSIS — R700 Elevated erythrocyte sedimentation rate: Secondary | ICD-10-CM | POA: Insufficient documentation

## 2023-01-22 DIAGNOSIS — S161XXA Strain of muscle, fascia and tendon at neck level, initial encounter: Secondary | ICD-10-CM | POA: Insufficient documentation

## 2023-01-22 DIAGNOSIS — L720 Epidermal cyst: Secondary | ICD-10-CM | POA: Insufficient documentation

## 2023-01-22 NOTE — Assessment & Plan Note (Signed)
After adjusting for gender and age, patient's ESR is just slightly above normal. We will redraw the ESR after her current course of prednisone.

## 2023-01-22 NOTE — Assessment & Plan Note (Signed)
Much improved since her urgent care visit. She will continue to stretch her neck gently with slow movements and let us know if her neck pain does not improve.

## 2023-01-22 NOTE — Progress Notes (Signed)
[name], MD Phone: [phone]  Brittany Santos is a 62 y.o. female who presents today for f/u.  Neck muscle strain: patient went to [facility] urgent care on 1/31 for neck pain and trouble turning her head. She reports that the pain moved down her temple, left side of the face, and neck every time she tried to move her head. She was given prednisone, augmentin, metaxalone, and tramadol for the neck pain. She was instructed to come here after her ESR was found to be elevated at 40. Today she feels overall much better and most of her range of motion at the neck has returned. She denies headaches, changes in vision, and sinus pressure.   Diabetes: patient's sugars at home are typically in the 130s but has not checked recently due to her current course of prednisone. She is taking metformin '1000mg'$  twice daily. She denies any polyuria, polydipsia, and hypoglycemia.  Neck cyst: patient reports a cyst of fluctuating size behind her neck near midline. She reports that she's had courses of antibiotics given to her when the cyst becomes infected. She denies any tenderness, warmth, or inflammation at the cyst at this time.   Social History   Tobacco Use  Smoking Status Never  Smokeless Tobacco Never    Current Outpatient Medications on File Prior to Visit  Medication Sig Dispense Refill   amoxicillin-clavulanate (AUGMENTIN) 875-125 MG tablet Take by mouth.     BIOTIN PO Take 1 tablet by mouth daily.     Calcium-Magnesium-Vitamin D (CALCIUM 500 PO) Take by mouth.     cetirizine (ZYRTEC) 10 MG tablet Take 1 tablet (10 mg total) by mouth daily. 30 tablet 1   fluticasone (FLONASE) 50 MCG/ACT nasal spray Place 1 spray into both nostrils 2 (two) times daily. 15.8 mL 1   gabapentin (NEURONTIN) 100 MG capsule Take 1 capsule (100 mg total) by mouth at bedtime for 7 days, THEN 1 capsule (100 mg total) 2 (two) times daily. 180 capsule 3   glucose blood test strip Dispense based on patient  preference and insurance preference, Use as once daily Dx: E11.9 100 each 12   Lancet Devices (ONE TOUCH DELICA LANCING DEV) MISC Use as directed Dx: E11.9 1 each 0   metFORMIN (GLUCOPHAGE-XR) 500 MG 24 hr tablet TAKE 2 TABLETS 2 TIMES     DAILY WITH MEALS 360 tablet 1   Multiple Vitamin (MULTIVITAMIN) capsule Take 1 capsule by mouth daily.     pantoprazole (PROTONIX) 40 MG tablet TAKE ONE (1) TABLET BY MOUTH TWO TIMES PER DAY 60 tablet 0   progesterone (PROMETRIUM) 100 MG capsule Take 1 capsule (100 mg total) by mouth daily. 90 capsule 3   No current facility-administered medications on file prior to visit.     ROS see history of present illness  Objective  Physical Exam Vitals:   01/22/23 1457  BP: 130/80  Pulse: 98  Temp: 97.8 F (36.6 C)  SpO2: 96%    BP Readings from Last 3 Encounters:  01/22/23 130/80  11/15/22 138/80  11/13/22 134/82   Wt Readings from Last 3 Encounters:  01/22/23 207 lb 3.2 oz (94 kg)  11/15/22 209 lb (94.8 kg)  11/13/22 209 lb 9.6 oz (95.1 kg)    Physical Exam Constitutional:      Appearance: Normal appearance.  HENT:     Head: Normocephalic and atraumatic.  Neck:     Comments: Slightly decreased range of motion of the neck. Nontender to palpation. Likely cyst at the  neck is mobile and nontender to palpation. Seems to be just under the skin.  Cardiovascular:     Rate and Rhythm: Normal rate and regular rhythm.  Pulmonary:     Effort: Pulmonary effort is normal.     Breath sounds: Normal breath sounds.  Skin:    General: Skin is warm and dry.  Neurological:     Mental Status: She is alert.      Assessment/Plan: Please see individual problem list.  Problem List Items Addressed This Visit     Epidermoid cyst of neck (Chronic)    Discussed options with patient to either monitor the cyst or have her be referred to dermatology for removal. Patient is agreeable to seeing dermatology for this so we will place the referral.        Relevant Orders   Ambulatory referral to Dermatology   Type 2 diabetes mellitus ([place]) - Primary (Chronic)    We will check A1c next month. Continue metformin '1000mg'$  BID.      Relevant Orders   HgB A1c   Encounter for vitamin deficiency screening    Patient currently taking a multivitamin. We will get a vitamin D with her next labs.       Relevant Orders   Vitamin D (25 hydroxy)   ESR raised    After adjusting for gender and age, patient's ESR is just slightly above normal. We will redraw the ESR after her current course of prednisone.      Relevant Orders   Sedimentation rate   Neck muscle strain    Much improved since her urgent care visit. She will continue to stretch her neck gently with slow movements and let us know if her neck pain does not improve.         Return in about 1 month (around 02/20/2023) for labs, 6 months PCP.   Marisa Cyphers, Medical Student [place]

## 2023-01-22 NOTE — Progress Notes (Signed)
Patient seen along with medical student Zada Zhong.  I personally evaluated this patient along with the student, and verified all aspects of the history, physical exam, and medical decision making as documented by the student.  I agree with the student's documentation and have made all necessary edits.  Haden Suder, MD  

## 2023-01-22 NOTE — Assessment & Plan Note (Addendum)
We will check A1c next month. Continue metformin '1000mg'$  BID.

## 2023-01-22 NOTE — Assessment & Plan Note (Signed)
Discussed options with patient to either monitor the cyst or have her be referred to dermatology for removal. Patient is agreeable to seeing dermatology for this so we will place the referral.

## 2023-01-22 NOTE — Assessment & Plan Note (Addendum)
Patient currently taking a multivitamin. We will get a vitamin D with her next labs.

## 2023-01-25 DIAGNOSIS — G4733 Obstructive sleep apnea (adult) (pediatric): Secondary | ICD-10-CM | POA: Diagnosis not present

## 2023-02-13 ENCOUNTER — Ambulatory Visit: Payer: BC Managed Care – PPO | Admitting: Family Medicine

## 2023-02-14 ENCOUNTER — Ambulatory Visit: Payer: BC Managed Care – PPO | Admitting: Family Medicine

## 2023-02-21 ENCOUNTER — Ambulatory Visit: Payer: BC Managed Care – PPO | Admitting: Family Medicine

## 2023-02-26 ENCOUNTER — Ambulatory Visit: Payer: BC Managed Care – PPO | Admitting: Family Medicine

## 2023-02-27 ENCOUNTER — Other Ambulatory Visit (INDEPENDENT_AMBULATORY_CARE_PROVIDER_SITE_OTHER): Payer: BC Managed Care – PPO

## 2023-02-27 DIAGNOSIS — Z1321 Encounter for screening for nutritional disorder: Secondary | ICD-10-CM | POA: Diagnosis not present

## 2023-02-27 DIAGNOSIS — R700 Elevated erythrocyte sedimentation rate: Secondary | ICD-10-CM | POA: Diagnosis not present

## 2023-02-27 DIAGNOSIS — E119 Type 2 diabetes mellitus without complications: Secondary | ICD-10-CM | POA: Diagnosis not present

## 2023-02-27 LAB — SEDIMENTATION RATE: Sed Rate: 48 mm/hr — ABNORMAL HIGH (ref 0–30)

## 2023-02-27 LAB — HEMOGLOBIN A1C: Hgb A1c MFr Bld: 6.8 % — ABNORMAL HIGH (ref 4.6–6.5)

## 2023-02-27 LAB — VITAMIN D 25 HYDROXY (VIT D DEFICIENCY, FRACTURES): VITD: 31.57 ng/mL (ref 30.00–100.00)

## 2023-02-28 ENCOUNTER — Other Ambulatory Visit: Payer: Self-pay

## 2023-02-28 ENCOUNTER — Telehealth: Payer: Self-pay | Admitting: Gastroenterology

## 2023-02-28 DIAGNOSIS — E119 Type 2 diabetes mellitus without complications: Secondary | ICD-10-CM

## 2023-02-28 MED ORDER — PANTOPRAZOLE SODIUM 40 MG PO TBEC: 40.0000 mg | DELAYED_RELEASE_TABLET | Freq: Every day | ORAL | 0 refills | Status: DC

## 2023-02-28 NOTE — Addendum Note (Signed)
Addended by: Lurlean Nanny on: 02/28/2023 11:15 AM   Modules accepted: Orders

## 2023-02-28 NOTE — Telephone Encounter (Signed)
Rx sent through e-scribe  

## 2023-02-28 NOTE — Telephone Encounter (Signed)
Patient calling about getting a refill on her pantoprazole '40mg'$ . She is asking if she can get a 90 day supply. She also stated that she has cut back to 1 tablet a day and would like to talk to Dr Allen Norris about this during her upcoming appointment.

## 2023-03-14 ENCOUNTER — Other Ambulatory Visit: Payer: Self-pay | Admitting: Gastroenterology

## 2023-03-20 ENCOUNTER — Other Ambulatory Visit: Payer: Self-pay

## 2023-03-20 DIAGNOSIS — E119 Type 2 diabetes mellitus without complications: Secondary | ICD-10-CM | POA: Diagnosis not present

## 2023-03-20 DIAGNOSIS — J0110 Acute frontal sinusitis, unspecified: Secondary | ICD-10-CM | POA: Diagnosis not present

## 2023-03-20 DIAGNOSIS — Z03818 Encounter for observation for suspected exposure to other biological agents ruled out: Secondary | ICD-10-CM | POA: Diagnosis not present

## 2023-03-20 DIAGNOSIS — R030 Elevated blood-pressure reading, without diagnosis of hypertension: Secondary | ICD-10-CM | POA: Diagnosis not present

## 2023-03-21 ENCOUNTER — Encounter: Payer: Self-pay | Admitting: Gastroenterology

## 2023-03-21 ENCOUNTER — Telehealth (INDEPENDENT_AMBULATORY_CARE_PROVIDER_SITE_OTHER): Payer: BC Managed Care – PPO | Admitting: Gastroenterology

## 2023-03-21 DIAGNOSIS — K219 Gastro-esophageal reflux disease without esophagitis: Secondary | ICD-10-CM | POA: Diagnosis not present

## 2023-03-21 NOTE — Progress Notes (Signed)
Brittany Lame, MD [address]  [place] [address]  [place], [place] [address]  Main: [phone]  Fax: [phone]    Gastroenterology Virtual/Video Visit  Referring Provider:     Leone Haven, MD Primary Care Physician:  Leone Haven, MD Primary Gastroenterologist:  Dr.Dustin Santos Brittany Santos Reason for Consultation:     Medication refill        HPI:    Virtual Visit via Video Note Location of the patient: Home Location of provider: Home Participating persons: The patient and myself.  I connected with Brittany Santos on 03/21/23 at  9:30 AM EDT by a video enabled telemedicine application and verified that I am speaking with the correct person using two identifiers.   I discussed the limitations of evaluation and management by telemedicine and the availability of in person appointments. The patient expressed understanding and agreed to proceed.  Verbal consent to proceed obtained.  History of Present Illness: Brittany Santos is a 62 y.o. female referred by Dr. Caryl Bis, Angela Adam, MD  for consultation & management of medication.  This patient has a history of GERD and has been taking pantoprazole for and has not been seen for a year.  The patient had an EGD and colonoscopy back in August 2021 without any polyps seen at that time. The patient reports that she has been on Protonix twice a day and try to go down to once a day without success due to her symptoms returning.  The patient has no worry symptoms such as dysphagia nausea vomiting fevers or chills.  She also denies any black stools or bloody stools.  Past Medical History:  Diagnosis Date   Allergy    Arthritis    BRCA negative 01/2020   MyRisk neg   Diabetes mellitus without complication ([place])    Family history of breast cancer    GERD (gastroesophageal reflux disease)    History of colon polyps 2016, 2017   at [facility]   History of diverticulitis    History of kidney stones 04/15/15   Increased risk of breast  cancer 01/2020   IBIS=26.6%   Sleep apnea     Past Surgical History:  Procedure Laterality Date   BILATERAL CARPAL TUNNEL RELEASE     BREAST EXCISIONAL BIOPSY Bilateral    benign   BREAST SURGERY     cervical neck fusion     CHOLECYSTECTOMY     COLONOSCOPY  2017   Dr. Gustavo Lah with polyp; repeat due 2021   COLONOSCOPY WITH PROPOFOL N/A 08/12/2020   Procedure: COLONOSCOPY WITH PROPOFOL;  Surgeon: Brittany Lame, MD;  Location[facility] ENDOSCOPY;  Service: Endoscopy;  Laterality: N/A;   ENDOVENOUS ABLATION SAPHENOUS VEIN W/ LASER Left 04/15/2020   endovenous laser ablation left greater saphenous vein by Gae Gallop MD    ENDOVENOUS ABLATION SAPHENOUS VEIN W/ LASER Left 10/21/2020   endovenous laser ablation left small saphenous vein by Gae Gallop MD    ESOPHAGOGASTRODUODENOSCOPY (EGD) WITH PROPOFOL N/A 05/12/2016   Procedure: ESOPHAGOGASTRODUODENOSCOPY (EGD) WITH PROPOFOL;  Surgeon: Lollie Sails, MD;  Location: [facility] ENDOSCOPY;  Service: Endoscopy;  Laterality: N/A;   ESOPHAGOGASTRODUODENOSCOPY (EGD) WITH PROPOFOL N/A 08/12/2020   Procedure: ESOPHAGOGASTRODUODENOSCOPY (EGD) WITH PROPOFOL;  Surgeon: Brittany Lame, MD;  Location: [facility] ENDOSCOPY;  Service: Endoscopy;  Laterality: N/A;   TONSILLECTOMY Left 09/04/2022   Procedure: PARTIAL TONSILLECTOMY;  Surgeon: Carloyn Manner, MD;  Location: [facility] ORS;  Service: ENT;  Laterality: Left;    Prior to Admission medications   Medication  Sig Start Date End Date Taking? Authorizing Provider  amoxicillin-clavulanate (AUGMENTIN) 875-125 MG tablet Take by mouth. 03/20/23 03/30/23  [provider]  azelastine (ASTELIN) 0.1 % nasal spray Place into the nose. 03/20/23   [provider]  BIOTIN PO Take 1 tablet by mouth daily.    [provider]  Calcium-Magnesium-Vitamin D (CALCIUM 500 PO) Take by mouth.    [provider]  cetirizine (ZYRTEC) 10 MG tablet Take 1 tablet (10 mg total) by mouth daily. 03/08/22    Flinchum, Kelby Aline, FNP  fluticasone (FLONASE) 50 MCG/ACT nasal spray Place 1 spray into both nostrils 2 (two) times daily. 03/08/22   Flinchum, Kelby Aline, FNP  gabapentin (NEURONTIN) 100 MG capsule Take 1 capsule (100 mg total) by mouth at bedtime for 7 days, THEN 1 capsule (100 mg total) 2 (two) times daily. 11/13/22 02/18/23  Leone Haven, MD  glucose blood test strip Dispense based on patient preference and insurance preference, Use as once daily Dx: E11.9 01/09/20   Leone Haven, MD  Lancet Devices (ONE TOUCH DELICA LANCING DEV) MISC Use as directed Dx: E11.9 09/29/15   Rubbie Battiest, RN  metFORMIN (GLUCOPHAGE-XR) 500 MG 24 hr tablet TAKE 2 TABLETS 2 TIMES     DAILY WITH MEALS 11/13/22   Leone Haven, MD  Multiple Vitamin (MULTIVITAMIN) capsule Take 1 capsule by mouth daily.    [provider]  pantoprazole (PROTONIX) 40 MG tablet TAKE ONE (1) TABLET BY MOUTH TWO TIMES PER DAY 03/14/23   Brittany Lame, MD  predniSONE (DELTASONE) 20 MG tablet Take by mouth. 03/20/23 03/25/23  [provider]  progesterone (PROMETRIUM) 100 MG capsule Take 1 capsule (100 mg total) by mouth daily. [id number]   Copland, Deirdre Evener, PA-C  promethazine-dextromethorphan (PROMETHAZINE-DM) 6.25-15 MG/5ML syrup Take by mouth. 03/20/23   [provider]    Family History  Problem Relation Age of Onset   Breast cancer Mother        twice--30s and 31 (bilat breasts)   Breast cancer Sister 8   Cancer Sister 66       Blood cancer (unknown)   Hypertension Brother    Cancer Maternal Aunt        Lung   Kidney cancer Maternal Uncle 30   Prostate cancer Maternal Uncle 79       needed seed tx   Heart attack Paternal Uncle    Aneurysm Paternal Grandmother    Prostate cancer Cousin 58   Diabetes Neg Hx      Social History   Tobacco Use   Smoking status: Never   Smokeless tobacco: Never  Vaping Use   Vaping Use: Never used  Substance Use Topics   Alcohol use: No    Alcohol/week:  0.0 standard drinks of alcohol   Drug use: No    Allergies as of 03/21/2023 - Review Complete 01/22/2023  Allergen Reaction Noted   Clarithromycin  03/14/2019   Vicodin [hydrocodone-acetaminophen] Itching and Rash 01/20/2015    Review of Systems:    All systems reviewed and negative except where noted in HPI.   Observations/Objective:  Labs: CBC    Component Value Date/Time   WBC 8.3 04/26/2018 0832   RBC 4.24 04/26/2018 0832   HGB 12.9 04/26/2018 0832   HCT 38.3 04/26/2018 0832   PLT 251.0 04/26/2018 0832   MCV 90.5 04/26/2018 0832   MCH 28.9 10/21/2015 1724   MCHC 33.6 04/26/2018 0832   RDW 15.0 04/26/2018 0832  LYMPHSABS 1.8 06/07/2009 1620   MONOABS 0.6 06/07/2009 1620   EOSABS 0.2 06/07/2009 1620   BASOSABS 0.0 06/07/2009 1620   CMP     Component Value Date/Time   NA 135 11/13/2022 1129   NA 142 10/09/2014 0000   K 3.6 11/13/2022 1129   CL 97 11/13/2022 1129   CO2 30 11/13/2022 1129   GLUCOSE 159 (H) 11/13/2022 1129   BUN 16 11/13/2022 1129   BUN 11 10/09/2014 0000   CREATININE 0.69 11/13/2022 1129   CALCIUM 9.6 11/13/2022 1129   PROT 7.0 11/13/2022 1129   ALBUMIN 4.4 11/13/2022 1129   AST 20 11/13/2022 1129   ALT 20 11/13/2022 1129   ALKPHOS 61 11/13/2022 1129   BILITOT 0.6 11/13/2022 1129   GFRNONAA >60 10/21/2015 1724   GFRAA >60 10/21/2015 1724    Imaging Studies: No results found.  Assessment and Plan:   Zalie Jersey is a 62 y.o. y/o female has been referred for refill of her medication.  The patient is not having any issues at the present time.  The patient will have her medications refilled and will contact me if she has any further issues.  Due to her last colonoscopy being normal she has been told that her next colonoscopy should be in 7 years past her previous one which would be 2028.  Patient has been explained the plan and agrees with it.  Follow Up Instructions:  I discussed the assessment and treatment plan with the  patient. The patient was provided an opportunity to ask questions and all were answered. The patient agreed with the plan and demonstrated an understanding of the instructions.   The patient was advised to call back or seek an in-person evaluation if the symptoms worsen or if the condition fails to improve as anticipated.  I provided 20 minutes of non-face-to-face time during this encounter including chart review In preparation for the encounter.   Brittany Lame, MD  Speech recognition software was used to dictate the above note.

## 2023-03-22 ENCOUNTER — Telehealth: Payer: Self-pay

## 2023-03-22 ENCOUNTER — Other Ambulatory Visit: Payer: Self-pay | Admitting: Obstetrics and Gynecology

## 2023-03-22 DIAGNOSIS — N951 Menopausal and female climacteric states: Secondary | ICD-10-CM

## 2023-03-22 MED ORDER — PROGESTERONE MICRONIZED 100 MG PO CAPS: 100.0000 mg | ORAL_CAPSULE | Freq: Every day | ORAL | 0 refills | Status: DC

## 2023-03-22 NOTE — Progress Notes (Signed)
Rx RF prog till annual 5/24

## 2023-03-22 NOTE — Telephone Encounter (Signed)
Rx RF eRxd.  

## 2023-03-22 NOTE — Telephone Encounter (Signed)
Pt made an appointment with you on 05/03/23 can we refill the progesterone?

## 2023-03-22 NOTE — Telephone Encounter (Signed)
Pt aware.

## 2023-04-12 ENCOUNTER — Encounter: Payer: Self-pay | Admitting: Family Medicine

## 2023-04-17 NOTE — Telephone Encounter (Signed)
Hand written prescription for cpap supplies placed in signed folder. Please fax to the vendor as outlined in the patients message.

## 2023-04-18 NOTE — Telephone Encounter (Addendum)
Faxed prescription to [facility] per Patient request. Fax confirmation received

## 2023-05-02 NOTE — Progress Notes (Unsigned)
PCP: Brittany Luis, MD   No chief complaint on file.   HPI:      [name] is a 62 y.o. G0P0000 who LMP was No LMP recorded (lmp unknown). Patient is postmenopausal., presents today for her annual examination.  Her menses are absent due to menopause. She does not have PMB. She has occas vasomotor sx with daily prometrium use. Would like to stay on it.   Sex activity: not sexually active. She does not have vaginal dryness.  Last Pap: 03/06/22  Results were: ASCUS/neg HPV DNA.   Pt with infected "boil" 9/22 that improved with drainage. Pt states the area comes and goes. Sometimes bleeds a little when it drains. Hard to use warm compresses there.   Last mammogram: 12/27/21 Results were: normal--routine follow-up in 12 months There is a FH of breast cancer in her mom twice. Pt is MyRisk neg 2021; IBIS=26.6%. There is no FH of ovarian cancer. There is a FH of prostate and kidney cancer in her mat uncles. The patient does do self-breast exams. She has not had scr breast MRI.   Colonoscopy: 2016 and 2017 with Dr. Marva Santos;  Repeat 2021 with  Dr. Servando Santos, repeat due after 5 yrs  Tobacco use: The patient denies current or previous tobacco use. Alcohol use: none  No drug use Exercise: mod active   She does get adequate calcium and Vitamin D in her diet.  Labs with PCP. Normal DEXA 2/23 with PCP  Pt feels like she has a sinus infection for 2-3 days. Has nasal congestion (can't breathe out of her nose, hard at night when has to use CPAP), itching RT ear, occas cough, and maxillary sinus pressure/feels swollen on RT side. Is using flonase and sudafed without relief; takes zyrtec nightly due to hx of allergies. Did amox for sinusitis with PCP 2/23 with some improvement, but sx back now.  Patient Active Problem List   Diagnosis Date Noted   ESR raised 01/22/2023   Epidermoid cyst of neck 01/22/2023   Encounter for vitamin deficiency screening 01/22/2023   Neck muscle strain  01/22/2023   Elevated blood pressure reading 11/13/2022   Left shoulder pain 11/13/2022   Depression, major, single episode, mild (HCC) 05/22/2022   Increased risk of breast cancer 03/06/2022   Sebaceous gland hyperplasia of vulva 03/06/2022   Strain of left wrist 05/17/2021   Acute gastritis without hemorrhage    Personal history of colonic polyps    Stress at work 06/22/2020   Obesity 02/19/2020   Right shoulder pain 02/16/2020   Family history of breast cancer 01/15/2020   Vasomotor symptoms due to menopause 01/15/2020   Sinus drainage 01/09/2020   Piriformis syndrome, left 05/08/2019   Bilateral leg edema 04/26/2018   OSA (obstructive sleep apnea) 04/26/2018   Back pain, chronic 07/06/2016   IBS (irritable bowel syndrome) 12/21/2015   GERD (gastroesophageal reflux disease) 04/21/2015   Type 2 diabetes mellitus (HCC) 01/20/2015    Past Surgical History:  Procedure Laterality Date   BILATERAL CARPAL TUNNEL RELEASE     BREAST EXCISIONAL BIOPSY Bilateral    benign   BREAST SURGERY     cervical neck fusion     CHOLECYSTECTOMY     COLONOSCOPY  2017   Dr. Marva Santos with polyp; repeat due 2021   COLONOSCOPY WITH PROPOFOL N/A 08/12/2020   Procedure: COLONOSCOPY WITH PROPOFOL;  Surgeon: Brittany Minium, MD;  Location: [facility] ENDOSCOPY;  Service: Endoscopy;  Laterality: N/A;   ENDOVENOUS ABLATION SAPHENOUS  VEIN W/ LASER Left 04/15/2020   endovenous laser ablation left greater saphenous vein by Brittany Caraway MD    ENDOVENOUS ABLATION SAPHENOUS VEIN W/ LASER Left 10/21/2020   endovenous laser ablation left small saphenous vein by Brittany Caraway MD    ESOPHAGOGASTRODUODENOSCOPY (EGD) WITH PROPOFOL N/A 05/12/2016   Procedure: ESOPHAGOGASTRODUODENOSCOPY (EGD) WITH PROPOFOL;  Surgeon: Brittany Deem, MD;  Location: [facility] ENDOSCOPY;  Service: Endoscopy;  Laterality: N/A;   ESOPHAGOGASTRODUODENOSCOPY (EGD) WITH PROPOFOL N/A 08/12/2020   Procedure: ESOPHAGOGASTRODUODENOSCOPY (EGD) WITH PROPOFOL;   Surgeon: Brittany Minium, MD;  Location: [facility] ENDOSCOPY;  Service: Endoscopy;  Laterality: N/A;   TONSILLECTOMY Left 09/04/2022   Procedure: PARTIAL TONSILLECTOMY;  Surgeon: Brittany Face, MD;  Location: [facility] ORS;  Service: ENT;  Laterality: Left;    Family History  Problem Relation Age of Onset   Breast cancer Mother        twice--30s and 3 (bilat breasts)   Breast cancer Sister 26   Cancer Sister 20       Blood cancer (unknown)   Hypertension Brother    Cancer Maternal Aunt        Lung   Kidney cancer Maternal Uncle 30   Prostate cancer Maternal Uncle 79       needed seed tx   Heart attack Paternal Uncle    Aneurysm Paternal Grandmother    Prostate cancer Cousin 68   Diabetes Neg Hx     Social History   Socioeconomic History   Marital status: Single    Spouse name: Not on file   Number of children: Not on file   Years of education: Not on file   Highest education level: Not on file  Occupational History   Not on file  Tobacco Use   Smoking status: Never   Smokeless tobacco: Never  Vaping Use   Vaping Use: Never used  Substance and Sexual Activity   Alcohol use: No    Alcohol/week: 0.0 standard drinks of alcohol   Drug use: No   Sexual activity: Not Currently    Birth control/protection: Post-menopausal  Other Topics Concern   Not on file  Social History Narrative   Works- [employer]    Mother lives with pt   No children    1 dog lives inside    Right handed    Caffeine- 1 piece chocolate occasionally    Associate degree   Enjoys- gardening and going to concerts    Social Determinants of Corporate investment banker Strain: Not on file  Food Insecurity: Not on file  Transportation Needs: Not on file  Physical Activity: Not on file  Stress: Not on file  Social Connections: Not on file  Intimate Partner Violence: Not on file     Current Outpatient Medications:    azelastine (ASTELIN) 0.1 % nasal spray, Place into the nose., Disp: , Rfl:     BIOTIN PO, Take 1 tablet by mouth daily., Disp: , Rfl:    Calcium-Magnesium-Vitamin D (CALCIUM 500 PO), Take by mouth., Disp: , Rfl:    cetirizine (ZYRTEC) 10 MG tablet, Take 1 tablet (10 mg total) by mouth daily., Disp: 30 tablet, Rfl: 1   fluticasone (FLONASE) 50 MCG/ACT nasal spray, Place 1 spray into both nostrils 2 (two) times daily., Disp: 15.8 mL, Rfl: 1   gabapentin (NEURONTIN) 100 MG capsule, Take 1 capsule (100 mg total) by mouth at bedtime for 7 days, THEN 1 capsule (100 mg total) 2 (two) times daily., Disp: 180 capsule,  Rfl: 3   glucose blood test strip, Dispense based on patient preference and insurance preference, Use as once daily Dx: E11.9, Disp: 100 each, Rfl: 12   Lancet Devices (ONE TOUCH DELICA LANCING DEV) MISC, Use as directed Dx: E11.9, Disp: 1 each, Rfl: 0   metFORMIN (GLUCOPHAGE-XR) 500 MG 24 hr tablet, TAKE 2 TABLETS 2 TIMES     DAILY WITH MEALS, Disp: 360 tablet, Rfl: 1   Multiple Vitamin (MULTIVITAMIN) capsule, Take 1 capsule by mouth daily., Disp: , Rfl:    pantoprazole (PROTONIX) 40 MG tablet, TAKE ONE (1) TABLET BY MOUTH TWO TIMES PER DAY, Disp: 60 tablet, Rfl: 0   progesterone (PROMETRIUM) 100 MG capsule, Take 1 capsule (100 mg total) by mouth daily., Disp: 90 capsule, Rfl: 0   promethazine-dextromethorphan (PROMETHAZINE-DM) 6.25-15 MG/5ML syrup, Take by mouth., Disp: , Rfl:      ROS:  Review of Systems  Constitutional:  Negative for fatigue, fever and unexpected weight change.  Respiratory:  Negative for cough, shortness of breath and wheezing.   Cardiovascular:  Negative for chest pain, palpitations and leg swelling.  Gastrointestinal:  Negative for blood in stool, constipation, diarrhea, nausea and vomiting.  Endocrine: Negative for cold intolerance, heat intolerance and polyuria.  Genitourinary:  Negative for dyspareunia, dysuria, flank pain, frequency, genital sores, hematuria, menstrual problem, pelvic pain, urgency, vaginal bleeding, vaginal  discharge and vaginal pain.  Musculoskeletal:  Negative for back pain, joint swelling and myalgias.  Skin:  Negative for rash.  Neurological:  Negative for dizziness, syncope, light-headedness, numbness and headaches.  Hematological:  Negative for adenopathy.  Psychiatric/Behavioral:  Positive for agitation. Negative for confusion, sleep disturbance and suicidal ideas. The patient is not nervous/anxious.    BREAST: No symptoms    Objective: LMP  (LMP Unknown) Comment: Patient states she had her "uterus scraped" sometime in the past 10 years.    Physical Exam Constitutional:      Appearance: She is well-developed.  Genitourinary:     Vulva normal.     Right Labia: lesions.     Right Labia: No rash or tenderness.    Left Labia: No tenderness, lesions or rash.    No vaginal discharge, erythema or tenderness.      Right Adnexa: not tender and no mass present.    Left Adnexa: not tender and no mass present.    No cervical friability or polyp.     Uterus is not enlarged or tender.  Breasts:    Right: No mass, nipple discharge, skin change or tenderness.     Left: No mass, nipple discharge, skin change or tenderness.  HENT:     Nose:     Right Sinus: Maxillary sinus tenderness present.     Left Sinus: Maxillary sinus tenderness present.  Neck:     Thyroid: No thyromegaly.  Cardiovascular:     Rate and Rhythm: Normal rate and regular rhythm.     Heart sounds: Normal heart sounds. No murmur heard. Pulmonary:     Effort: Pulmonary effort is normal.     Breath sounds: Normal breath sounds.  Abdominal:     Palpations: Abdomen is soft.     Tenderness: There is no abdominal tenderness. There is no guarding or rebound.  Musculoskeletal:        General: Normal range of motion.     Cervical back: Normal range of motion.  Lymphadenopathy:     Cervical: No cervical adenopathy.  Neurological:     General: No focal deficit present.  Mental Status: She is alert and oriented to  person, place, and time.     Cranial Nerves: No cranial nerve deficit.  Skin:    General: Skin is warm and dry.  Psychiatric:        Mood and Affect: Mood normal.        Behavior: Behavior normal.        Thought Content: Thought content normal.        Judgment: Judgment normal.  Vitals reviewed.    Assessment/Plan:  Encounter for annual routine gynecological examination  Cervical cancer screening - Plan: Cytology - PAP  Screening for HPV (human papillomavirus) - Plan: Cytology - PAP  Encounter for screening mammogram for malignant neoplasm of breast; pt current on mammo  Family history of breast cancer--pt is MyRisk neg.   Increased risk of breast cancer--pt aware of recommendations of monthly SBE, yearly CBE and mammos, as well as scr breast MRI. Pt to call for MRI ref by 6/23 if interested.   Vasomotor symptoms due to menopause - Plan: progesterone (PROMETRIUM) 100 MG capsule; Rx RF.   Sebaceous gland hyperplasia of vulva--reassurance. Recommended warm compresses for sx. Discussed surg exc with MD if truly bothersome. May be able to do in office vs OP surg. Pt to f/u with MD prn.   Nasal congestion--for several days. Try afrin prn up to 3 days, cont sudafed/zyrtec. Given short course of sx, doubt it's bacterial. Pt to f/u with PCP if sx persist (has given her prednisone taper in past for sx).    No orders of the defined types were placed in this encounter.          GYN counsel mammography screening, menopause, adequate intake of calcium and vitamin D, diet and exercise    F/U  No follow-ups on file.  Brittany Distel B. Jaishon Krisher, PA-C 05/02/2023 8:38 PM

## 2023-05-03 ENCOUNTER — Ambulatory Visit (INDEPENDENT_AMBULATORY_CARE_PROVIDER_SITE_OTHER): Payer: BC Managed Care – PPO | Admitting: Obstetrics and Gynecology

## 2023-05-03 ENCOUNTER — Other Ambulatory Visit (HOSPITAL_COMMUNITY)
Admission: RE | Admit: 2023-05-03 | Discharge: 2023-05-03 | Disposition: A | Discharge status: Home / Self Care | Payer: BC Managed Care – PPO | Source: Ambulatory Visit | Attending: Obstetrics and Gynecology | Admitting: Obstetrics and Gynecology

## 2023-05-03 ENCOUNTER — Encounter: Payer: Self-pay | Admitting: Obstetrics and Gynecology

## 2023-05-03 VITALS — BP 130/90 | Ht 64.0 in | Wt 211.0 lb

## 2023-05-03 DIAGNOSIS — Z124 Encounter for screening for malignant neoplasm of cervix: Secondary | ICD-10-CM | POA: Diagnosis not present

## 2023-05-03 DIAGNOSIS — L738 Other specified follicular disorders: Secondary | ICD-10-CM

## 2023-05-03 DIAGNOSIS — Z0100 Encounter for examination of eyes and vision without abnormal findings: Secondary | ICD-10-CM | POA: Diagnosis not present

## 2023-05-03 DIAGNOSIS — Z803 Family history of malignant neoplasm of breast: Secondary | ICD-10-CM

## 2023-05-03 DIAGNOSIS — Z1151 Encounter for screening for human papillomavirus (HPV): Secondary | ICD-10-CM | POA: Insufficient documentation

## 2023-05-03 DIAGNOSIS — Z01419 Encounter for gynecological examination (general) (routine) without abnormal findings: Secondary | ICD-10-CM | POA: Diagnosis not present

## 2023-05-03 DIAGNOSIS — E119 Type 2 diabetes mellitus without complications: Secondary | ICD-10-CM | POA: Diagnosis not present

## 2023-05-03 DIAGNOSIS — Z1231 Encounter for screening mammogram for malignant neoplasm of breast: Secondary | ICD-10-CM

## 2023-05-03 DIAGNOSIS — N951 Menopausal and female climacteric states: Secondary | ICD-10-CM | POA: Diagnosis not present

## 2023-05-03 DIAGNOSIS — R87610 Atypical squamous cells of undetermined significance on cytologic smear of cervix (ASC-US): Secondary | ICD-10-CM | POA: Insufficient documentation

## 2023-05-03 DIAGNOSIS — Z9189 Other specified personal risk factors, not elsewhere classified: Secondary | ICD-10-CM

## 2023-05-03 DIAGNOSIS — H02403 Unspecified ptosis of bilateral eyelids: Secondary | ICD-10-CM | POA: Diagnosis not present

## 2023-05-03 DIAGNOSIS — H2513 Age-related nuclear cataract, bilateral: Secondary | ICD-10-CM | POA: Diagnosis not present

## 2023-05-03 LAB — HM DIABETES EYE EXAM

## 2023-05-03 NOTE — Patient Instructions (Addendum)
I value your feedback and you entrusting us with your care. If you get a [facility] patient survey, I would appreciate you taking the time to let us know about your experience today. Thank you!  [facility]: [phone]      

## 2023-05-09 LAB — CYTOLOGY - PAP
Comment: NEGATIVE
Diagnosis: NEGATIVE
High risk HPV: NEGATIVE

## 2023-05-10 ENCOUNTER — Ambulatory Visit
Admission: RE | Admit: 2023-05-10 | Discharge: 2023-05-10 | Disposition: A | Discharge status: Home / Self Care | Payer: BC Managed Care – PPO | Source: Ambulatory Visit | Attending: Obstetrics and Gynecology | Admitting: Obstetrics and Gynecology

## 2023-05-10 DIAGNOSIS — Z9189 Other specified personal risk factors, not elsewhere classified: Secondary | ICD-10-CM | POA: Diagnosis not present

## 2023-05-10 DIAGNOSIS — Z803 Family history of malignant neoplasm of breast: Secondary | ICD-10-CM | POA: Insufficient documentation

## 2023-05-10 DIAGNOSIS — Z1231 Encounter for screening mammogram for malignant neoplasm of breast: Secondary | ICD-10-CM | POA: Insufficient documentation

## 2023-05-11 ENCOUNTER — Encounter: Payer: Self-pay | Admitting: Obstetrics and Gynecology

## 2023-05-11 DIAGNOSIS — Z803 Family history of malignant neoplasm of breast: Secondary | ICD-10-CM

## 2023-05-11 DIAGNOSIS — Z9189 Other specified personal risk factors, not elsewhere classified: Secondary | ICD-10-CM

## 2023-05-11 DIAGNOSIS — Z1239 Encounter for other screening for malignant neoplasm of breast: Secondary | ICD-10-CM

## 2023-05-11 NOTE — Telephone Encounter (Signed)
Patient called and said that [facility] never received CPAP supply fax. Patient called and they gave her a local number to fax to [phone]. Please resend.

## 2023-05-14 ENCOUNTER — Other Ambulatory Visit: Payer: Self-pay | Admitting: Obstetrics and Gynecology

## 2023-05-14 DIAGNOSIS — N951 Menopausal and female climacteric states: Secondary | ICD-10-CM

## 2023-05-29 DIAGNOSIS — L720 Epidermal cyst: Secondary | ICD-10-CM | POA: Diagnosis not present

## 2023-05-29 DIAGNOSIS — D492 Neoplasm of unspecified behavior of bone, soft tissue, and skin: Secondary | ICD-10-CM | POA: Diagnosis not present

## 2023-06-06 DIAGNOSIS — G4733 Obstructive sleep apnea (adult) (pediatric): Secondary | ICD-10-CM | POA: Diagnosis not present

## 2023-06-27 ENCOUNTER — Other Ambulatory Visit: Payer: Self-pay | Admitting: Gastroenterology

## 2023-07-10 DIAGNOSIS — H02403 Unspecified ptosis of bilateral eyelids: Secondary | ICD-10-CM | POA: Diagnosis not present

## 2023-07-12 DIAGNOSIS — H57813 Brow ptosis, bilateral: Secondary | ICD-10-CM | POA: Diagnosis not present

## 2023-07-12 DIAGNOSIS — H02831 Dermatochalasis of right upper eyelid: Secondary | ICD-10-CM | POA: Diagnosis not present

## 2023-07-21 ENCOUNTER — Other Ambulatory Visit: Payer: Self-pay | Admitting: Family Medicine

## 2023-07-23 ENCOUNTER — Encounter: Payer: Self-pay | Admitting: Family Medicine

## 2023-07-23 ENCOUNTER — Ambulatory Visit (INDEPENDENT_AMBULATORY_CARE_PROVIDER_SITE_OTHER): Payer: BC Managed Care – PPO | Admitting: Family Medicine

## 2023-07-23 VITALS — BP 122/78 | HR 98 | Temp 97.7°F | Ht 64.0 in | Wt 208.4 lb

## 2023-07-23 DIAGNOSIS — L720 Epidermal cyst: Secondary | ICD-10-CM | POA: Diagnosis not present

## 2023-07-23 DIAGNOSIS — M5441 Lumbago with sciatica, right side: Secondary | ICD-10-CM

## 2023-07-23 DIAGNOSIS — E119 Type 2 diabetes mellitus without complications: Secondary | ICD-10-CM | POA: Diagnosis not present

## 2023-07-23 DIAGNOSIS — Z7984 Long term (current) use of oral hypoglycemic drugs: Secondary | ICD-10-CM

## 2023-07-23 DIAGNOSIS — G8929 Other chronic pain: Secondary | ICD-10-CM

## 2023-07-23 DIAGNOSIS — M2550 Pain in unspecified joint: Secondary | ICD-10-CM | POA: Insufficient documentation

## 2023-07-23 LAB — POCT GLYCOSYLATED HEMOGLOBIN (HGB A1C): Hemoglobin A1C: 6.4 % — AB (ref 4.0–5.6)

## 2023-07-23 NOTE — Assessment & Plan Note (Signed)
Chronic issue.  Much improved at this time.  She will see Derm allergy as planned for follow-up.

## 2023-07-23 NOTE — Progress Notes (Signed)
Marikay Alar, MD Phone: [phone]  Brittany Santos is a 62 y.o. female who presents today for f/u.  DIABETES Disease Monitoring: Blood Sugar ranges-110-140 Polyuria/phagia/dipsia- no      Optho- UTD Medications: Compliance- taking metformin Hypoglycemic symptoms- no  Chronic back pain: Has improved some.  Gabapentin has been mostly helpful.  Joint pain/fatigue: Patient notes this has been going on a couple of months.  She notes no rash.  No known tick bites.  She does note several mosquito bites.  She notes she will get up at night and move around to help with the discomfort in her joints.  She notes the discomfort is in multiple joints throughout her body.  She wonders if this could be related to a mosquito borne illness.  Patient notes no fever or chills.  She has not traveled out of the country.  Epidermoid cyst: Patient had a cyst on the back of her neck.  This got infected and had it lanced by dermatology.  She also received an antibiotic for this.  She notes this has improved significantly she follows up with dermatology in October.   Social History   Tobacco Use  Smoking Status Never  Smokeless Tobacco Never    Current Outpatient Medications on File Prior to Visit  Medication Sig Dispense Refill   azelastine (ASTELIN) 0.1 % nasal spray Place into the nose.     BIOTIN PO Take 1 tablet by mouth daily.     Calcium-Magnesium-Vitamin D (CALCIUM 500 PO) Take by mouth.     cetirizine (ZYRTEC) 10 MG tablet Take 1 tablet (10 mg total) by mouth daily. 30 tablet 1   glucose blood test strip Dispense based on patient preference and insurance preference, Use as once daily Dx: E11.9 100 each 12   JARDIANCE 25 MG TABS tablet Take 25 mg by mouth every morning.     Lancet Devices (ONE TOUCH DELICA LANCING DEV) MISC Use as directed Dx: E11.9 1 each 0   metFORMIN (GLUCOPHAGE-XR) 500 MG 24 hr tablet TAKE 2 TABLETS 2 TIMES     DAILY WITH MEALS 360 tablet 1   Multiple Vitamin  (MULTIVITAMIN) capsule Take 1 capsule by mouth daily.     pantoprazole (PROTONIX) 40 MG tablet TAKE ONE TABLET BY MOUTH TWICE DAILY 60 tablet 6   gabapentin (NEURONTIN) 100 MG capsule Take 1 capsule (100 mg total) by mouth at bedtime for 7 days, THEN 1 capsule (100 mg total) 2 (two) times daily. 180 capsule 3   No current facility-administered medications on file prior to visit.     ROS see history of present illness  Objective  Physical Exam Vitals:   07/23/23 1519 07/23/23 1535  BP: (!) 140/82 122/78  Pulse: 98   Temp: 97.7 F (36.5 C)   SpO2: 96%     BP Readings from Last 3 Encounters:  07/23/23 122/78  05/03/23 (!) 130/90  01/22/23 130/80   Wt Readings from Last 3 Encounters:  07/23/23 208 lb 6.4 oz (94.5 kg)  05/03/23 211 lb (95.7 kg)  01/22/23 207 lb 3.2 oz (94 kg)    Physical Exam Constitutional:      General: She is not in acute distress.    Appearance: She is not diaphoretic.  Cardiovascular:     Rate and Rhythm: Normal rate and regular rhythm.     Heart sounds: Normal heart sounds.  Pulmonary:     Effort: Pulmonary effort is normal.     Breath sounds: Normal breath sounds.  Musculoskeletal:  Comments: Patient has some joint tenderness in her shoulders and elbows.  No tenderness in her wrist, knees, or ankles  Skin:    General: Skin is warm and dry.  Neurological:     Mental Status: She is alert.      Assessment/Plan: Please see individual problem list.  Type 2 diabetes mellitus without complication, without long-term current use of insulin (HCC) Assessment & Plan: Chronic issue.  Seems to be well-controlled.  Check A1c today.  Continue metformin 1000 mg twice daily.  Orders: -     POCT glycosylated hemoglobin (Hb A1C)  Epidermoid cyst of neck Assessment & Plan: Chronic issue.  Much improved at this time.  She will see Derm allergy as planned for follow-up.   Chronic right-sided low back pain with right-sided sciatica Assessment &  Plan: Chronic issue.  Improved.  She will continue gabapentin 100 mg twice daily.   Arthralgia, unspecified joint Assessment & Plan: Patient with widespread arthralgias.  Patient notes this has been going on a couple of months.  She is concerned about mosquito borne illness.  I will send my note to infectious disease to get their input on whether or not testing for mosquito borne illnesses is worthwhile.  I plan to have labs to evaluate for arthralgias as well though we will do these in the future once I hear back from infectious disease.  Orders: -     Comprehensive metabolic panel; Future -     Sedimentation rate; Future -     Rheumatoid factor; Future -     ANA; Future -     Cyclic citrul peptide antibody, IgG; Future -     TSH; Future   Patient is to call us in 2 to 3 days if she has not heard a response to my message to the infectious disease specialist.  Return in about 1 week (around 07/30/2023) for Labs, 6 months PCP.   Marikay Alar, MD [facility]

## 2023-07-23 NOTE — Assessment & Plan Note (Signed)
Chronic issue.  Seems to be well-controlled.  Check A1c today.  Continue metformin 1000 mg twice daily.

## 2023-07-23 NOTE — Assessment & Plan Note (Signed)
Patient with widespread arthralgias.  Patient notes this has been going on a couple of months.  She is concerned about mosquito borne illness.  I will send my note to infectious disease to get their input on whether or not testing for mosquito borne illnesses is worthwhile.  I plan to have labs to evaluate for arthralgias as well though we will do these in the future once I hear back from infectious disease.

## 2023-07-23 NOTE — Patient Instructions (Signed)
Please call us if you do not hear back from [place] later this week on the infectious disease specialist response.

## 2023-07-23 NOTE — Assessment & Plan Note (Signed)
Chronic issue.  Improved.  She will continue gabapentin 100 mg twice daily.

## 2023-07-24 ENCOUNTER — Telehealth: Payer: Self-pay | Admitting: Family Medicine

## 2023-07-24 NOTE — Telephone Encounter (Signed)
-----   Message from Lynn Ito sent at 07/23/2023  4:21 PM EDT ----- Without fever or epidemiological risk it is unlikely to be a mosquito borne arthralgia like Dengue, [place], Zika etc ----- Message ----- From: Glori Luis, MD Sent: 07/23/2023   2:53 PM EDT To: Lynn Ito, MD  Hi Dr Rivka Safer,  I saw this patient for follow-up today and she noted 2 months of arthralgias all over her body and fatigue. She wondered if this could be related to a mosquito borne illness and asked about testing for this.  She reports being bitten by mosquitoes on numerous occasions this summer.  She has not been out of the country.  She denies any fever or chills.  She notes no other symptoms other than joint pain and fatigue.  I plan to obtain labs for arthralgias for her though wanted to see if you could provide any input on whether or not mosquito borne illness testing would be warranted.  In the past I have not typically done this when evaluating the symptoms.  Thank you for your assistance.  Minerva Areola

## 2023-07-24 NOTE — Telephone Encounter (Signed)
Please let the patient know that I heard back from infectious disease.  They noted that given the lack of fever or epidemiologic risk her symptoms are unlikely to be related to a mosquito borne illness.  I would advise that the patient have the labs that I already ordered as scheduled.

## 2023-07-25 NOTE — Telephone Encounter (Signed)
Noted  

## 2023-07-25 NOTE — Telephone Encounter (Signed)
Patient states she will get the labs on Monday.

## 2023-07-30 ENCOUNTER — Other Ambulatory Visit (INDEPENDENT_AMBULATORY_CARE_PROVIDER_SITE_OTHER): Payer: BC Managed Care – PPO

## 2023-07-30 DIAGNOSIS — M2550 Pain in unspecified joint: Secondary | ICD-10-CM | POA: Diagnosis not present

## 2023-07-31 LAB — SEDIMENTATION RATE: Sed Rate: 58 mm/h — ABNORMAL HIGH (ref 0–30)

## 2023-08-03 ENCOUNTER — Telehealth: Payer: Self-pay

## 2023-08-03 NOTE — Telephone Encounter (Signed)
Lvm, see message below

## 2023-08-03 NOTE — Telephone Encounter (Signed)
-----   Message from Marikay Alar sent at 08/03/2023 11:54 AM EDT ----- Please let the patient know that her ANA is positive though is only weakly so.  This in combination with her elevated sed rate could indicate that there is some underlying issue causing her joint pains.  I would suggest referral to rheumatology to get their input.  I can place this referral if she is willing.

## 2023-08-06 NOTE — Telephone Encounter (Signed)
Pt called back and I read the message to her and she stated if he thinks that is needed to be done than place the referral. Pt stated better safe than sorry

## 2023-08-13 NOTE — Telephone Encounter (Signed)
-----   Message from Marikay Alar sent at 08/03/2023 11:54 AM EDT ----- Please let the patient know that her ANA is positive though is only weakly so.  This in combination with her elevated sed rate could indicate that there is some underlying issue causing her joint pains.  I would suggest referral to rheumatology to get their input.  I can place this referral if she is willing.

## 2023-08-13 NOTE — Telephone Encounter (Signed)
Lvm for pt to give office a call back, my chart message sent as well

## 2023-08-13 NOTE — Telephone Encounter (Signed)
Patient states she is returning a call from Kristie Cowman, CMA.  I read message from Dr. Marikay Alar to patient.  Patient states she has called in the past and someone read the message to her and she has not heard from anyone regarding her referral.  Patient states she would like to speak with Daijah.  I transferred call to Daijah.

## 2023-08-15 ENCOUNTER — Other Ambulatory Visit: Payer: Self-pay | Admitting: Family Medicine

## 2023-08-15 DIAGNOSIS — M2550 Pain in unspecified joint: Secondary | ICD-10-CM

## 2023-08-29 ENCOUNTER — Ambulatory Visit (INDEPENDENT_AMBULATORY_CARE_PROVIDER_SITE_OTHER): Payer: BC Managed Care – PPO | Admitting: Family

## 2023-08-29 ENCOUNTER — Encounter: Payer: Self-pay | Admitting: Family

## 2023-08-29 VITALS — BP 136/78 | HR 93 | Temp 98.8°F | Ht 64.0 in | Wt 205.4 lb

## 2023-08-29 DIAGNOSIS — U071 COVID-19: Secondary | ICD-10-CM | POA: Diagnosis not present

## 2023-08-29 DIAGNOSIS — J309 Allergic rhinitis, unspecified: Secondary | ICD-10-CM | POA: Diagnosis not present

## 2023-08-29 DIAGNOSIS — R051 Acute cough: Secondary | ICD-10-CM | POA: Diagnosis not present

## 2023-08-29 LAB — POCT INFLUENZA A/B
Influenza A, POC: NEGATIVE
Influenza B, POC: NEGATIVE

## 2023-08-29 LAB — POC COVID19 BINAXNOW: SARS Coronavirus 2 Ag: POSITIVE — AB

## 2023-08-29 MED ORDER — PROMETHAZINE-DM 6.25-15 MG/5ML PO SYRP: ORAL_SOLUTION | ORAL | 0 refills | Status: DC

## 2023-08-29 MED ORDER — AZELASTINE HCL 0.1 % NA SOLN
1.0000 | Freq: Two times a day (BID) | NASAL | 2 refills | Status: DC
Start: 2023-08-29 — End: 2024-06-24

## 2023-08-29 MED ORDER — NIRMATRELVIR/RITONAVIR (PAXLOVID)TABLET
3.0000 | ORAL_TABLET | Freq: Two times a day (BID) | ORAL | 0 refills | Status: AC
Start: 2023-08-29 — End: 2023-09-03

## 2023-08-29 NOTE — Progress Notes (Signed)
Assessment & Plan:  COVID-19 Assessment & Plan: Fortunately no acute respiratory distress.  Patient is afebrile.  Patient desires to start Paxlovid.   Counseled on lacking long term safely and effectiveness data of medication, Paxlovid, in particular with circulating strains of COVID-19.  Provided her with Promethazine DM and counseled on updated CDC isolation guidelines.   Orders: -     nirmatrelvir/ritonavir; Take 3 tablets by mouth 2 (two) times daily for 5 days. (Take nirmatrelvir 150 mg two tablets twice daily for 5 days and ritonavir 100 mg one tablet twice daily for 5 days) Patient GFR is 92  Dispense: 30 tablet; Refill: 0 -     Promethazine-DM; 5 mL every 4 to 6 hours; maximum: 30 mL in 24 hours  Dispense: 70 mL; Refill: 0  Allergic rhinitis, unspecified seasonality, unspecified trigger -     Azelastine HCl; Place 1 spray into both nostrils 2 (two) times daily.  Dispense: 30 mL; Refill: 2  Acute cough -     POC COVID-19 BinaxNow -     POCT Influenza A/B     Return precautions given.   Risks, benefits, and alternatives of the medications and treatment plan prescribed today were discussed, and patient expressed understanding.   Education regarding symptom management and diagnosis given to patient on AVS either electronically or printed.  No follow-ups on file.  Rennie Plowman, FNP  Subjective:    Patient ID: Brittany Santos, female    DOB: 03-Jul-1961, 62 y.o.   MRN: [id number]  CC: Brittany Santos is a 62 y.o. female who presents today for an acute visit.    HPI: Complains of cough, PND x 2 days ago.   Endorses 'pressure' HA, ear pressure.  Scratchy throat yesterday yesterday due to congestion.    No fever,vision loss, sore throat  Using azelastine, claritin. She requests refill of azelastine.   She has not used cough suppressant.   No recent antibiotics.       She took motrin 600mg  yesterday for HA.    She has not had COVID booster History of  diabetes, OSA, GERD Allergies: Clarithromycin and Vicodin [hydrocodone-acetaminophen] Current Outpatient Medications on File Prior to Visit  Medication Sig Dispense Refill   BIOTIN PO Take 1 tablet by mouth daily.     Calcium-Magnesium-Vitamin D (CALCIUM 500 PO) Take by mouth.     cetirizine (ZYRTEC) 10 MG tablet Take 1 tablet (10 mg total) by mouth daily. 30 tablet 1   glucose blood test strip Dispense based on patient preference and insurance preference, Use as once daily Dx: E11.9 100 each 12   JARDIANCE 25 MG TABS tablet Take 25 mg by mouth every morning.     Lancet Devices (ONE TOUCH DELICA LANCING DEV) MISC Use as directed Dx: E11.9 1 each 0   metFORMIN (GLUCOPHAGE-XR) 500 MG 24 hr tablet TAKE 2 TABLETS 2 TIMES     DAILY WITH MEALS 360 tablet 1   Multiple Vitamin (MULTIVITAMIN) capsule Take 1 capsule by mouth daily.     pantoprazole (PROTONIX) 40 MG tablet TAKE ONE TABLET BY MOUTH TWICE DAILY 60 tablet 6   gabapentin (NEURONTIN) 100 MG capsule Take 1 capsule (100 mg total) by mouth at bedtime for 7 days, THEN 1 capsule (100 mg total) 2 (two) times daily. 180 capsule 3   No current facility-administered medications on file prior to visit.    Review of Systems  Constitutional:  Negative for chills and fever.  HENT:  Positive for congestion, postnasal  drip and sore throat.   Respiratory:  Positive for cough. Negative for shortness of breath and wheezing.   Cardiovascular:  Negative for chest pain and palpitations.  Gastrointestinal:  Negative for nausea and vomiting.  Neurological:  Negative for headaches.      Objective:    BP 136/78   Pulse 93   Temp 98.8 F (37.1 C) (Oral)   Ht 5\' 4"  (1.626 m)   Wt 205 lb 6.4 oz (93.2 kg)   LMP  (LMP Unknown) Comment: Patient states she had her "uterus scraped" sometime in the past 10 years.   SpO2 96%   BMI 35.26 kg/m   BP Readings from Last 3 Encounters:  08/29/23 136/78  07/23/23 122/78  05/03/23 (!) 130/90   Wt Readings from  Last 3 Encounters:  08/29/23 205 lb 6.4 oz (93.2 kg)  07/23/23 208 lb 6.4 oz (94.5 kg)  05/03/23 211 lb (95.7 kg)    Physical Exam Vitals reviewed.  Constitutional:      Appearance: She is well-developed.  HENT:     Head: Normocephalic and atraumatic.     Right Ear: Hearing, tympanic membrane, ear canal and external ear normal. No decreased hearing noted. No drainage, swelling or tenderness. No middle ear effusion. No foreign body. Tympanic membrane is not erythematous or bulging.     Left Ear: Hearing, tympanic membrane, ear canal and external ear normal. No decreased hearing noted. No drainage, swelling or tenderness.  No middle ear effusion. No foreign body. Tympanic membrane is not erythematous or bulging.     Nose: No rhinorrhea.     Right Sinus: Maxillary sinus tenderness present. No frontal sinus tenderness.     Left Sinus: Maxillary sinus tenderness present. No frontal sinus tenderness.     Mouth/Throat:     Pharynx: Uvula midline. No oropharyngeal exudate or posterior oropharyngeal erythema.     Tonsils: No tonsillar abscesses.  Eyes:     Conjunctiva/sclera: Conjunctivae normal.  Cardiovascular:     Rate and Rhythm: Regular rhythm.     Pulses: Normal pulses.     Heart sounds: Normal heart sounds.  Pulmonary:     Effort: Pulmonary effort is normal.     Breath sounds: Normal breath sounds. No wheezing, rhonchi or rales.  Lymphadenopathy:     Head:     Right side of head: No submental, submandibular, tonsillar, preauricular, posterior auricular or occipital adenopathy.     Left side of head: No submental, submandibular, tonsillar, preauricular, posterior auricular or occipital adenopathy.     Cervical: No cervical adenopathy.  Skin:    General: Skin is warm and dry.  Neurological:     Mental Status: She is alert.  Psychiatric:        Speech: Speech normal.        Behavior: Behavior normal.        Thought Content: Thought content normal.

## 2023-08-29 NOTE — Assessment & Plan Note (Signed)
Fortunately no acute respiratory distress.  Patient is afebrile.  Patient desires to start Paxlovid.   Counseled on lacking long term safely and effectiveness data of medication, Paxlovid, in particular with circulating strains of COVID-19.  Provided her with Promethazine DM and counseled on updated CDC isolation guidelines.

## 2023-08-29 NOTE — Patient Instructions (Addendum)
You may start Promethazine DM for cough   you no longer have to quarantine per cdc guidelines as long as you are fever free without antipyretic (Tylenol or ibuprofen) for 24 hours.    We discussed starting Paxlovid which is an unapproved drug that is authorized for use under an Emergency Use Authorization.  There are no adequate, approved, available products for the treatment of COVID-19 in adults who have mild-to-moderate COVID-19 and are at high risk for progressing to severe COVID-19, including hospitalization or death.  There are benefits and risks of taking this treatment as outlined in the "Fact Sheet for Patients and Caregivers." You may find this document here and please read in detail   [email or web address].[email or web address]   I have sent Paxlovid to your pharmacy. Please call pharmacy so they bring medication out to your car and you do not have to go inside.   PAXLOVID ADMINISTRATION INSTRUCTIONS:  Take with or without food. Swallow the tablets whole. Don't chew, crush, or break the medications because it might not work as well  For each dose of the medication, you should be taking 3 tablets together (2 pink oval and 1 white oval) TWICE a day for FIVE days   Finish your full five-day course of Paxlovid even if you feel better before you're done. Stopping this medication too early can make it less effective to prevent severe illness related to COVID19.    Paxlovid is prescribed for YOU ONLY. Don't share it with others, even if they have similar symptoms as you. This medication might not be right for everyone.  Make sure to take steps to protect yourself and others while you're taking this medication in order to get well soon and to prevent others from getting sick with COVID-19.  Paxlovid (nirmatrelvir / ritonavir) can cause hormonal birth control medications to not work well. If you or your partner is currently taking hormonal birth control, use condoms or other birth control  methods to prevent unintended pregnancies.   COMMON SIDE EFFECTS: Altered or bad taste in your mouth  Diarrhea  High blood pressure (1% of people) Muscle aches (1% of people)    If your COVID-19 symptoms get worse, get medical help right away. Call 911 if you experience symptoms such as worsening cough, trouble breathing, chest pain that doesn't go away, confusion, a hard time staying awake, and pale or blue-colored skin.This medication won't prevent all COVID-19 cases from getting worse.

## 2023-09-04 ENCOUNTER — Telehealth: Payer: Self-pay | Admitting: Family Medicine

## 2023-09-04 ENCOUNTER — Encounter: Payer: Self-pay | Admitting: Family Medicine

## 2023-09-04 DIAGNOSIS — G4733 Obstructive sleep apnea (adult) (pediatric): Secondary | ICD-10-CM | POA: Diagnosis not present

## 2023-09-04 NOTE — Telephone Encounter (Signed)
Patient was seen by Arnett on 08/29/2023,  she tested positive for COVID. She has finished her meds, still coughing, chest is full of congestion and hurts. Patient wants to know what to do next.

## 2023-09-05 NOTE — Telephone Encounter (Signed)
Spoke to pt and scheduled appt in office with Sonnenberg on 9/23

## 2023-09-06 ENCOUNTER — Encounter: Payer: Self-pay | Admitting: Family Medicine

## 2023-09-06 ENCOUNTER — Ambulatory Visit: Payer: BC Managed Care – PPO | Admitting: Family Medicine

## 2023-09-06 ENCOUNTER — Ambulatory Visit: Payer: BC Managed Care – PPO

## 2023-09-06 VITALS — BP 138/74 | HR 88 | Temp 97.6°F | Resp 16 | Ht 64.0 in | Wt 202.1 lb

## 2023-09-06 DIAGNOSIS — U071 COVID-19: Secondary | ICD-10-CM | POA: Diagnosis not present

## 2023-09-06 DIAGNOSIS — R053 Chronic cough: Secondary | ICD-10-CM | POA: Diagnosis not present

## 2023-09-06 DIAGNOSIS — R071 Chest pain on breathing: Secondary | ICD-10-CM

## 2023-09-06 DIAGNOSIS — R0609 Other forms of dyspnea: Secondary | ICD-10-CM

## 2023-09-06 MED ORDER — AZITHROMYCIN 250 MG PO TABS
ORAL_TABLET | ORAL | 0 refills | Status: AC
Start: 2023-09-06 — End: 2023-09-11

## 2023-09-06 MED ORDER — PROMETHAZINE-DM 6.25-15 MG/5ML PO SYRP
ORAL_SOLUTION | ORAL | 0 refills | Status: DC
Start: 2023-09-06 — End: 2023-11-26

## 2023-09-06 NOTE — Patient Instructions (Addendum)
It was a pleasure meeting you today. Thank you for allowing me to take part in your health care.  Our goals for today as we discussed include:  Will treat for pneumonia with Z pack Cough syrup sent Important to stay hydrated  Will let you know what final xray report reads.  If worsening symptoms go to ED for evaluation  Please let me know how feeling tomorrow how feeling.   If you have any questions or concerns, please do not hesitate to call the office at [phone].  I look forward to our next visit and until then take care and stay safe.  Regards,   Dana Allan, MD   [facility]

## 2023-09-06 NOTE — Progress Notes (Signed)
SUBJECTIVE:   Chief Complaint  Patient presents with   Cough    Rattling in chest when she cough    HPI Presents to clinic for acute visit  Had COVID 08/29/23.  Treated with Paxlovid.  Continues to have cough, worse in evening and night.  Associated symptoms runny nose, shortness of breath, more on exertion, chest discomfort, with cough. Denies any fevers, heart flutters or increased heart rate, pain on inspiration, weight loss, nausea/vomiting.  Hydrating well. Mother also has COVID.    PERTINENT PMH / PSH: As above DM Type 2 OBJECTIVE:  BP 138/74   Pulse 88   Temp 97.6 F (36.4 C)   Resp 16   Ht 5\' 4"  (1.626 m)   Wt 202 lb 2 oz (91.7 kg)   LMP  (LMP Unknown) Comment: Patient states she had her "uterus scraped" sometime in the past 10 years.   SpO2 98%   BMI 34.69 kg/m    Physical Exam Vitals reviewed.  Constitutional:      General: She is not in acute distress.    Appearance: Normal appearance. She is normal weight. She is not ill-appearing, toxic-appearing or diaphoretic.  Eyes:     General:        Right eye: No discharge.        Left eye: No discharge.     Conjunctiva/sclera: Conjunctivae normal.  Cardiovascular:     Rate and Rhythm: Normal rate and regular rhythm.     Heart sounds: Normal heart sounds.  Pulmonary:     Effort: No respiratory distress.     Breath sounds: Rhonchi present.  Chest:     Chest wall: Tenderness present.  Musculoskeletal:        General: Normal range of motion.  Skin:    General: Skin is warm and dry.  Neurological:     General: No focal deficit present.     Mental Status: She is alert and oriented to person, place, and time. Mental status is at baseline.  Psychiatric:        Mood and Affect: Mood normal.        Behavior: Behavior normal.        Thought Content: Thought content normal.        Judgment: Judgment normal.        08/29/2023   12:52 PM 07/23/2023    3:21 PM 11/13/2022   11:26 AM 05/22/2022    2:42 PM 11/18/2021     8:00 AM  Depression screen PHQ 2/9  Decreased Interest 0 0 1 0 0  Down, Depressed, Hopeless 0 0 0 0 0  PHQ - 2 Score 0 0 1 0 0  Altered sleeping 0 2     Tired, decreased energy 0 2     Change in appetite 0 0     Feeling bad or failure about yourself  0 0     Trouble concentrating 0 0     Moving slowly or fidgety/restless 0 0     Suicidal thoughts 0 0     PHQ-9 Score 0 4     Difficult doing work/chores Not difficult at all Not difficult at all         08/29/2023   12:53 PM 07/23/2023    3:21 PM  GAD 7 : Generalized Anxiety Score  Nervous, Anxious, on Edge 0 0  Control/stop worrying 0 0  Worry too much - different things 0 0  Trouble relaxing 0 0  Restless 0 0  Easily annoyed or irritable 0 0  Afraid - awful might happen 0 0  Total GAD 7 Score 0 0  Anxiety Difficulty Not difficult at all Not difficult at all    ASSESSMENT/PLAN:  Dyspnea on exertion Assessment & Plan: Exertional dyspnea post COVID Wheezing and rhonchi on lung exam Obtain chest xray Check BNP, CBC, Cmet Start Z pack x 5 days for coverage of post viral PNA  Orders: -     DG Chest 2 View; Future -     Comprehensive metabolic panel -     CBC -     Brain natriuretic peptide -     D-dimer, quantitative -     Azithromycin; Take 2 tablets on day 1, then 1 tablet daily on days 2 through 5  Dispense: 6 tablet; Refill: 0  COVID-19 -     Promethazine-DM; 5 mL every 4 to 6 hours; maximum: 30 mL in 24 hours  Dispense: 70 mL; Refill: 0  Chest pain on breathing Assessment & Plan: Related to increase in cough Wells score low Will check D Dimer, CBC given recent COVID PNA   Orders: -     DG Chest 2 View; Future -     CBC -     D-dimer, quantitative -     Azithromycin; Take 2 tablets on day 1, then 1 tablet daily on days 2 through 5  Dispense: 6 tablet; Refill: 0   PDMP reviewed  Return if symptoms worsen or fail to improve, for PCP.   Addendum:09/20 @ 1340 Patient came to visit with mother on 09/20.   Reports feeling some improvement after initiation of Z Pack.  Reviewed recent labs and reassuring.  D Dimer normal, BNP and WBC normal.  Final report for chest xray remains pending.  Dana Allan, MD

## 2023-09-07 ENCOUNTER — Encounter: Payer: Self-pay | Admitting: Family Medicine

## 2023-09-07 LAB — CBC
HCT: 44 % (ref 36.0–46.0)
Hemoglobin: 14.4 g/dL (ref 12.0–15.0)
MCHC: 32.7 g/dL (ref 30.0–36.0)
MCV: 90.1 fl (ref 78.0–100.0)
Platelets: 277 10*3/uL (ref 150.0–400.0)
RBC: 4.88 Mil/uL (ref 3.87–5.11)
RDW: 15 % (ref 11.5–15.5)
WBC: 7.1 10*3/uL (ref 4.0–10.5)

## 2023-09-07 LAB — COMPREHENSIVE METABOLIC PANEL
ALT: 25 U/L (ref 0–35)
AST: 28 U/L (ref 0–37)
Albumin: 4.4 g/dL (ref 3.5–5.2)
Alkaline Phosphatase: 64 U/L (ref 39–117)
BUN: 15 mg/dL (ref 6–23)
CO2: 28 mEq/L (ref 19–32)
Calcium: 10 mg/dL (ref 8.4–10.5)
Chloride: 101 mEq/L (ref 96–112)
Creatinine, Ser: 0.72 mg/dL (ref 0.40–1.20)
GFR: 88.91 mL/min (ref >60.00)
Glucose, Bld: 146 mg/dL — ABNORMAL HIGH (ref 70–99)
Potassium: 4.1 mEq/L (ref 3.5–5.1)
Sodium: 141 mEq/L (ref 135–145)
Total Bilirubin: 0.4 mg/dL (ref 0.2–1.2)
Total Protein: 7.2 g/dL (ref 6.0–8.3)

## 2023-09-07 LAB — BRAIN NATRIURETIC PEPTIDE: Pro B Natriuretic peptide (BNP): 66 pg/mL (ref 0.0–100.0)

## 2023-09-07 LAB — D-DIMER, QUANTITATIVE: D-Dimer, Quant: 0.4 mcg/mL FEU (ref <0.50)

## 2023-09-10 ENCOUNTER — Ambulatory Visit: Payer: BC Managed Care – PPO | Admitting: Family Medicine

## 2023-09-15 ENCOUNTER — Encounter: Payer: Self-pay | Admitting: Family Medicine

## 2023-09-15 DIAGNOSIS — R0609 Other forms of dyspnea: Secondary | ICD-10-CM | POA: Insufficient documentation

## 2023-09-15 DIAGNOSIS — R071 Chest pain on breathing: Secondary | ICD-10-CM | POA: Insufficient documentation

## 2023-09-15 NOTE — Assessment & Plan Note (Signed)
Exertional dyspnea post COVID Wheezing and rhonchi on lung exam Obtain chest xray Check BNP, CBC, Cmet Start Z pack x 5 days for coverage of post viral PNA

## 2023-09-15 NOTE — Assessment & Plan Note (Signed)
Related to increase in cough Wells score low Will check D Dimer, CBC given recent COVID PNA

## 2023-09-19 DIAGNOSIS — G8929 Other chronic pain: Secondary | ICD-10-CM | POA: Diagnosis not present

## 2023-09-19 DIAGNOSIS — M47816 Spondylosis without myelopathy or radiculopathy, lumbar region: Secondary | ICD-10-CM | POA: Diagnosis not present

## 2023-09-19 DIAGNOSIS — M7712 Lateral epicondylitis, left elbow: Secondary | ICD-10-CM | POA: Diagnosis not present

## 2023-09-19 DIAGNOSIS — M25512 Pain in left shoulder: Secondary | ICD-10-CM | POA: Diagnosis not present

## 2023-09-19 DIAGNOSIS — R768 Other specified abnormal immunological findings in serum: Secondary | ICD-10-CM | POA: Diagnosis not present

## 2023-09-19 DIAGNOSIS — M791 Myalgia, unspecified site: Secondary | ICD-10-CM | POA: Diagnosis not present

## 2023-09-25 ENCOUNTER — Encounter: Payer: Self-pay | Admitting: Family Medicine

## 2023-09-26 NOTE — Telephone Encounter (Signed)
Attempted to call pt, no answer. LMTCB

## 2023-09-26 NOTE — Telephone Encounter (Signed)
Patient just called and would like to speak to the nurse with Dr. Birdie Sons. About rescheduling an appointment for a TOC. I told her only Dr. Clent Ridges and NP Konrad Dolores were the only two accepting TOCs. She would like for someone to call her. Her number is [phone]

## 2023-10-09 DIAGNOSIS — L820 Inflamed seborrheic keratosis: Secondary | ICD-10-CM | POA: Diagnosis not present

## 2023-10-09 DIAGNOSIS — Q828 Other specified congenital malformations of skin: Secondary | ICD-10-CM | POA: Diagnosis not present

## 2023-10-09 DIAGNOSIS — Z1283 Encounter for screening for malignant neoplasm of skin: Secondary | ICD-10-CM | POA: Diagnosis not present

## 2023-10-09 DIAGNOSIS — L905 Scar conditions and fibrosis of skin: Secondary | ICD-10-CM | POA: Diagnosis not present

## 2023-10-12 NOTE — Telephone Encounter (Signed)
Patient had questions about my chart message. She wanted to verify the insurance is covering the MRI. Advised per message Dyann Ruddle has approved your MRI for [facility] in [place]. The contact number for scheduling is [phone]. Please contact the number provided at your earliest convenience to schedule. The authorization is valid between the dates of 10/12/2023-12/10/2023. Please schedule within the time frame of valid dates.   Patient will contact them to schedule.

## 2023-10-13 ENCOUNTER — Other Ambulatory Visit: Payer: Self-pay | Admitting: Family Medicine

## 2023-10-13 DIAGNOSIS — E119 Type 2 diabetes mellitus without complications: Secondary | ICD-10-CM

## 2023-11-02 DIAGNOSIS — M791 Myalgia, unspecified site: Secondary | ICD-10-CM | POA: Diagnosis not present

## 2023-11-02 DIAGNOSIS — M47816 Spondylosis without myelopathy or radiculopathy, lumbar region: Secondary | ICD-10-CM | POA: Diagnosis not present

## 2023-11-22 DIAGNOSIS — M25551 Pain in right hip: Secondary | ICD-10-CM | POA: Diagnosis not present

## 2023-11-22 DIAGNOSIS — M5459 Other low back pain: Secondary | ICD-10-CM | POA: Diagnosis not present

## 2023-11-26 ENCOUNTER — Ambulatory Visit: Payer: BC Managed Care – PPO | Admitting: Family Medicine

## 2023-11-26 ENCOUNTER — Encounter: Payer: Self-pay | Admitting: Family Medicine

## 2023-11-26 VITALS — BP 130/70 | HR 74 | Temp 98.2°F | Resp 18 | Ht 64.0 in | Wt 207.1 lb

## 2023-11-26 DIAGNOSIS — Z23 Encounter for immunization: Secondary | ICD-10-CM | POA: Diagnosis not present

## 2023-11-26 DIAGNOSIS — G8929 Other chronic pain: Secondary | ICD-10-CM

## 2023-11-26 DIAGNOSIS — M5441 Lumbago with sciatica, right side: Secondary | ICD-10-CM | POA: Diagnosis not present

## 2023-11-26 DIAGNOSIS — E118 Type 2 diabetes mellitus with unspecified complications: Secondary | ICD-10-CM | POA: Diagnosis not present

## 2023-11-26 DIAGNOSIS — K219 Gastro-esophageal reflux disease without esophagitis: Secondary | ICD-10-CM

## 2023-11-26 DIAGNOSIS — J3489 Other specified disorders of nose and nasal sinuses: Secondary | ICD-10-CM

## 2023-11-26 DIAGNOSIS — Z7984 Long term (current) use of oral hypoglycemic drugs: Secondary | ICD-10-CM

## 2023-11-26 LAB — MICROALBUMIN / CREATININE URINE RATIO
Creatinine,U: 34.5 mg/dL
Microalb Creat Ratio: 2 mg/g (ref 0.0–30.0)
Microalb, Ur: <0.7 mg/dL (ref 0.0–1.9)

## 2023-11-26 NOTE — Patient Instructions (Addendum)
It was a pleasure meeting you today. Thank you for allowing me to take part in your health care.  Our goals for today as we discussed include:  Foot exam today  Follow up in 2 months for Diabetes management   This is a list of the screening recommended for you and due dates:  Health Maintenance  Topic Date Due   Zoster (Shingles) Vaccine (1 of 2) Never done   Yearly kidney health urinalysis for diabetes  05/23/2023   COVID-19 Vaccine (4 - 2023-24 season) 08/19/2023   Complete foot exam   11/14/2023   Hemoglobin A1C  01/23/2024   Eye exam for diabetics  05/02/2024   Yearly kidney function blood test for diabetes  09/05/2024   Mammogram  05/09/2025   Colon Cancer Screening  08/12/2025   DTaP/Tdap/Td vaccine (2 - Td or Tdap) 12/20/2025   Pap with HPV screening  05/02/2028   Flu Shot  Completed   Hepatitis C Screening  Completed   HIV Screening  Completed   HPV Vaccine  Aged Out      If you have any questions or concerns, please do not hesitate to call the office at [phone].  I look forward to our next visit and until then take care and stay safe.  Regards,   Dana Allan, MD   [facility]

## 2023-11-26 NOTE — Progress Notes (Unsigned)
SUBJECTIVE:   Chief Complaint  Patient presents with  . Establish Care    Transferring from Dr. Birdie Sons   HPI Presents to clinic to transfer care    Needs foot exam  PERTINENT PMH / PSH: As above  OBJECTIVE:  BP 130/70   Pulse 74   Temp 98.2 F (36.8 C)   Resp 18   Ht 5\' 4"  (1.626 m)   Wt 207 lb 2 oz (94 kg)   LMP  (LMP Unknown) Comment: Patient states she had her "uterus scraped" sometime in the past 10 years.   SpO2 96%   BMI 35.55 kg/m    Physical Exam Vitals reviewed.  Constitutional:      General: She is not in acute distress.    Appearance: She is not ill-appearing.  HENT:     Head: Normocephalic.     Right Ear: Tympanic membrane, ear canal and external ear normal.     Left Ear: Tympanic membrane, ear canal and external ear normal.     Nose: Nose normal.     Mouth/Throat:     Mouth: Mucous membranes are moist.  Eyes:     Extraocular Movements: Extraocular movements intact.     Conjunctiva/sclera: Conjunctivae normal.     Pupils: Pupils are equal, round, and reactive to light.  Neck:     Thyroid: No thyromegaly or thyroid tenderness.     Vascular: No carotid bruit.  Cardiovascular:     Rate and Rhythm: Normal rate and regular rhythm.     Pulses: Normal pulses.     Heart sounds: Normal heart sounds.  Pulmonary:     Effort: Pulmonary effort is normal.     Breath sounds: Normal breath sounds.  Abdominal:     General: Bowel sounds are normal. There is no distension.     Palpations: Abdomen is soft.     Tenderness: There is no abdominal tenderness. There is no right CVA tenderness, left CVA tenderness, guarding or rebound.  Musculoskeletal:        General: Normal range of motion.     Cervical back: Normal range of motion.     Right lower leg: No edema.     Left lower leg: No edema.  Lymphadenopathy:     Cervical: No cervical adenopathy.  Skin:    Capillary Refill: Capillary refill takes less than 2 seconds.  Neurological:     General: No  focal deficit present.     Mental Status: She is alert and oriented to person, place, and time. Mental status is at baseline.     Motor: No weakness.  Psychiatric:        Mood and Affect: Mood normal.        Behavior: Behavior normal.        Thought Content: Thought content normal.        Judgment: Judgment normal.       11/26/2023   10:34 AM 08/29/2023   12:52 PM 07/23/2023    3:21 PM 11/13/2022   11:26 AM 05/22/2022    2:42 PM  Depression screen PHQ 2/9  Decreased Interest 0 0 0 1 0  Down, Depressed, Hopeless 0 0 0 0 0  PHQ - 2 Score 0 0 0 1 0  Altered sleeping 0 0 2    Tired, decreased energy 0 0 2    Change in appetite 0 0 0    Feeling bad or failure about yourself  0 0 0    Trouble concentrating 0  0 0    Moving slowly or fidgety/restless 0 0 0    Suicidal thoughts 0 0 0    PHQ-9 Score 0 0 4    Difficult doing work/chores Not difficult at all Not difficult at all Not difficult at all        11/26/2023   10:34 AM 08/29/2023   12:53 PM 07/23/2023    3:21 PM  GAD 7 : Generalized Anxiety Score  Nervous, Anxious, on Edge 0 0 0  Control/stop worrying 0 0 0  Worry too much - different things 0 0 0  Trouble relaxing 0 0 0  Restless 0 0 0  Easily annoyed or irritable 0 0 0  Afraid - awful might happen 0 0 0  Total GAD 7 Score 0 0 0  Anxiety Difficulty Not difficult at all Not difficult at all Not difficult at all    ASSESSMENT/PLAN:  Type 2 diabetes mellitus with complications (HCC) -     Microalbumin / creatinine urine ratio  Need for influenza vaccination -     Flu vaccine trivalent PF, 6mos and older(Flulaval,Afluria,Fluarix,Fluzone)   PDMP reviewed***  Return in about 2 months (around 01/27/2024) for PCP.  Dana Allan, MD

## 2023-11-28 DIAGNOSIS — M5459 Other low back pain: Secondary | ICD-10-CM | POA: Diagnosis not present

## 2023-11-28 DIAGNOSIS — M25551 Pain in right hip: Secondary | ICD-10-CM | POA: Diagnosis not present

## 2023-11-29 ENCOUNTER — Encounter: Payer: Self-pay | Admitting: Family Medicine

## 2023-11-29 DIAGNOSIS — Z23 Encounter for immunization: Secondary | ICD-10-CM | POA: Insufficient documentation

## 2023-11-29 MED ORDER — GABAPENTIN 100 MG PO CAPS: ORAL_CAPSULE | ORAL | 3 refills | Status: DC

## 2023-11-29 NOTE — Assessment & Plan Note (Signed)
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 Patient has been referred to a rheumatologist and is currently undergoing physical therapy. -Continue Gabapentin 100mg  twice daily. -Continue physical therapy as directed by rheumatologist.

## 2023-11-29 NOTE — Assessment & Plan Note (Signed)
Patient reports using Astelin nasal spray once daily due to dryness, but will increase to twice daily during periods of increased sinus issues. Occasionally uses an over-the-counter nasal spray for additional relief. -Continue Astelin nasal spray as needed, up to twice daily.

## 2023-11-29 NOTE — Assessment & Plan Note (Signed)
Patient is on Metformin 2 tablets twice daily and Jardiance 25mg  daily. Last A1C was around 7. -Continue current medication regimen. -Check A1C in February 2025.

## 2023-11-29 NOTE — Assessment & Plan Note (Addendum)
Patient is on Pantoprazole twice daily, which is not covered by her insurance. Reports worsening symptoms when attempting to reduce to once daily dosing. -Continue Pantoprazole twice daily. -Follows with Dr Servando Snare, GI

## 2023-11-30 ENCOUNTER — Ambulatory Visit
Admission: RE | Admit: 2023-11-30 | Discharge: 2023-11-30 | Disposition: A | Discharge status: Home / Self Care | Payer: BC Managed Care – PPO | Source: Ambulatory Visit | Attending: Obstetrics and Gynecology

## 2023-11-30 DIAGNOSIS — Z1239 Encounter for other screening for malignant neoplasm of breast: Secondary | ICD-10-CM

## 2023-11-30 DIAGNOSIS — Z803 Family history of malignant neoplasm of breast: Secondary | ICD-10-CM | POA: Diagnosis not present

## 2023-11-30 DIAGNOSIS — Z9189 Other specified personal risk factors, not elsewhere classified: Secondary | ICD-10-CM

## 2023-11-30 MED ORDER — GADOPICLENOL 0.5 MMOL/ML IV SOLN
9.0000 mL | Freq: Once | INTRAVENOUS | Status: AC | PRN
  Administered 2023-11-30: 9 mL via INTRAVENOUS

## 2023-12-03 DIAGNOSIS — M5459 Other low back pain: Secondary | ICD-10-CM | POA: Diagnosis not present

## 2023-12-03 DIAGNOSIS — M25551 Pain in right hip: Secondary | ICD-10-CM | POA: Diagnosis not present

## 2023-12-07 DIAGNOSIS — G4733 Obstructive sleep apnea (adult) (pediatric): Secondary | ICD-10-CM | POA: Diagnosis not present

## 2023-12-14 ENCOUNTER — Other Ambulatory Visit: Payer: Self-pay | Admitting: Family Medicine

## 2023-12-31 ENCOUNTER — Other Ambulatory Visit: Payer: Self-pay

## 2023-12-31 ENCOUNTER — Encounter: Payer: Self-pay | Admitting: Family Medicine

## 2023-12-31 MED ORDER — ONETOUCH DELICA LANCING DEV MISC: 0 refills | Status: DC

## 2023-12-31 MED ORDER — GLUCOSE BLOOD VI STRP: ORAL_STRIP | 12 refills | Status: DC

## 2023-12-31 NOTE — Telephone Encounter (Signed)
 Pt was getting them though work, but this year they have stopped the program.

## 2024-01-01 MED ORDER — GLUCOSE BLOOD VI STRP: ORAL_STRIP | 12 refills | Status: DC

## 2024-01-01 MED ORDER — ONETOUCH DELICA LANCING DEV MISC: 0 refills | Status: AC

## 2024-01-23 ENCOUNTER — Ambulatory Visit: Payer: BC Managed Care – PPO | Admitting: Family Medicine

## 2024-01-23 DIAGNOSIS — M5416 Radiculopathy, lumbar region: Secondary | ICD-10-CM | POA: Diagnosis not present

## 2024-01-24 ENCOUNTER — Ambulatory Visit: Payer: BC Managed Care – PPO | Admitting: Dermatology

## 2024-01-28 ENCOUNTER — Ambulatory Visit: Payer: BC Managed Care – PPO | Admitting: Family Medicine

## 2024-02-04 ENCOUNTER — Other Ambulatory Visit: Payer: Self-pay | Admitting: Gastroenterology

## 2024-02-05 ENCOUNTER — Encounter: Payer: Self-pay | Admitting: Family Medicine

## 2024-02-05 ENCOUNTER — Ambulatory Visit (INDEPENDENT_AMBULATORY_CARE_PROVIDER_SITE_OTHER): Payer: BC Managed Care – PPO | Admitting: Family Medicine

## 2024-02-05 VITALS — BP 114/76 | HR 74 | Temp 98.4°F | Resp 18 | Ht 64.0 in | Wt 207.2 lb

## 2024-02-05 DIAGNOSIS — K219 Gastro-esophageal reflux disease without esophagitis: Secondary | ICD-10-CM | POA: Diagnosis not present

## 2024-02-05 DIAGNOSIS — Z7984 Long term (current) use of oral hypoglycemic drugs: Secondary | ICD-10-CM | POA: Diagnosis not present

## 2024-02-05 DIAGNOSIS — E118 Type 2 diabetes mellitus with unspecified complications: Secondary | ICD-10-CM | POA: Diagnosis not present

## 2024-02-05 LAB — POCT GLYCOSYLATED HEMOGLOBIN (HGB A1C): Hemoglobin A1C: 6.3 % — AB (ref 4.0–5.6)

## 2024-02-05 MED ORDER — FAMOTIDINE 40 MG PO TABS: 40.0000 mg | ORAL_TABLET | Freq: Two times a day (BID) | ORAL | 1 refills | Status: DC

## 2024-02-05 NOTE — Progress Notes (Signed)
 SUBJECTIVE:   Chief Complaint  Patient presents with   Diabetes    6 month follow up   HPI Presents for follow up chronic disease management  Discussed the use of AI scribe software for clinical note transcription with the patient, who gave verbal consent to proceed.  History of Present Illness Brittany Santos is a 63 year old female with diabetes who presents for follow-up.  Her hemoglobin A1c has improved from 6.8% to 6.3%, which she attributes to her medication regimen, including metformin and Jardiance, despite missing a week of Jardiance while on vacation. She was also on prednisone for back issues during this period. No changes in diet have been made, but she is making an effort to control portions and focus on protein intake.  She is currently taking pantoprazole twice a day for acid reflux, as once a day was insufficient to control her symptoms. She has tried Pepcid in the past but found the over-the-counter capsules did not sit well with her stomach.  She has started going to the gym two to three times a week, engaging in both muscle training and cardio exercises. She has joined [employer], where she completed a six-week challenge with a Systems analyst. No chest pain or shortness of breath.      PERTINENT PMH / PSH: As above  OBJECTIVE:  BP 114/76   Pulse 74   Temp 98.4 F (36.9 C)   Resp 18   Ht 5\' 4"  (1.626 m)   Wt 207 lb 4 oz (94 kg)   LMP  (LMP Unknown) Comment: Patient states she had her "uterus scraped" sometime in the past 10 years.   SpO2 99%   BMI 35.57 kg/m    Physical Exam Vitals reviewed.  Constitutional:      General: She is not in acute distress.    Appearance: Normal appearance. She is obese. She is not ill-appearing, toxic-appearing or diaphoretic.  Eyes:     General:        Right eye: No discharge.        Left eye: No discharge.     Conjunctiva/sclera: Conjunctivae normal.  Cardiovascular:     Rate and Rhythm: Normal rate and  regular rhythm.     Heart sounds: Normal heart sounds.  Pulmonary:     Effort: Pulmonary effort is normal.     Breath sounds: Normal breath sounds.  Musculoskeletal:        General: Normal range of motion.  Skin:    General: Skin is warm and dry.  Neurological:     General: No focal deficit present.     Mental Status: She is alert and oriented to person, place, and time. Mental status is at baseline.  Psychiatric:        Mood and Affect: Mood normal.        Behavior: Behavior normal.        Thought Content: Thought content normal.        Judgment: Judgment normal.            11/26/2023   10:34 AM 08/29/2023   12:52 PM 07/23/2023    3:21 PM 11/13/2022   11:26 AM 05/22/2022    2:42 PM  Depression screen PHQ 2/9  Decreased Interest 0 0 0 1 0  Down, Depressed, Hopeless 0 0 0 0 0  PHQ - 2 Score 0 0 0 1 0  Altered sleeping 0 0 2    Tired, decreased energy 0 0 2  Change in appetite 0 0 0    Feeling bad or failure about yourself  0 0 0    Trouble concentrating 0 0 0    Moving slowly or fidgety/restless 0 0 0    Suicidal thoughts 0 0 0    PHQ-9 Score 0 0 4    Difficult doing work/chores Not difficult at all Not difficult at all Not difficult at all        11/26/2023   10:34 AM 08/29/2023   12:53 PM 07/23/2023    3:21 PM  GAD 7 : Generalized Anxiety Score  Nervous, Anxious, on Edge 0 0 0  Control/stop worrying 0 0 0  Worry too much - different things 0 0 0  Trouble relaxing 0 0 0  Restless 0 0 0  Easily annoyed or irritable 0 0 0  Afraid - awful might happen 0 0 0  Total GAD 7 Score 0 0 0  Anxiety Difficulty Not difficult at all Not difficult at all Not difficult at all    ASSESSMENT/PLAN:  Type 2 diabetes with complication [facility]) Assessment & Plan: Improved HbA1c from 6.8 to 6.3 despite one week off Jardiance and a course of prednisone. Patient is also on Metformin. Discussed the importance of consistent medication use and the potential for medication reduction with  continued lifestyle modifications. -Continue current regimen of Metformin and Jardiance. -Encourage continued exercise and dietary modifications focusing on portion control and increased protein intake.  Orders: -     POCT glycosylated hemoglobin (Hb A1C)  Gastroesophageal reflux disease, unspecified whether esophagitis present Assessment & Plan: Patient on Pantoprazole 40mg  twice daily with good control of symptoms. Discussed potential long-term side effects of high-dose proton pump inhibitors and proposed a trial of dose reduction with addition of Pepcid. -Start Pepcid 40mg  twice daily for 1-2 weeks. -After 1-2 weeks, reduce Pantoprazole to 40mg  at night. -If symptoms flare, patient may take an extra dose of Pantoprazole.  Orders: -     Famotidine; Take 1 tablet (40 mg total) by mouth 2 (two) times daily.  Dispense: 60 tablet; Refill: 1  Morbid obesity (HCC) Assessment & Plan: Has been actively making changes toward a healthier lifestyle.  -Continue regular exercise regimen at the gym. -Continue to portion control and make healthy choices      PDMP reviewed  Return in about 3 months (around 05/04/2024) for PCP, DM.  Dana Allan, MD

## 2024-02-05 NOTE — Patient Instructions (Addendum)
It was a pleasure meeting you today. Thank you for allowing me to take part in your health care.  Our goals for today as we discussed include:  A1c 6.3 today.  Good control Continue current medication Continue activity at gym Increase protein in diet.  30-60 gm with each meal Portion size with each meal.  Check with insurance to see if coverage for Mounjaro or Ozempic for Diabetes.   This is a list of the screening recommended for you and due dates:  Health Maintenance  Topic Date Due   COVID-19 Vaccine (4 - 2024-25 season) 08/19/2023   Zoster (Shingles) Vaccine (1 of 2) 02/27/2024*   Eye exam for diabetics  05/02/2024   Hemoglobin A1C  08/04/2024   Yearly kidney function blood test for diabetes  09/05/2024   Yearly kidney health urinalysis for diabetes  11/25/2024   Complete foot exam   11/25/2024   Pneumococcal Vaccination (3 of 3 - PPSV23 or PCV20) 02/22/2025   Colon Cancer Screening  08/12/2025   Mammogram  11/29/2025   DTaP/Tdap/Td vaccine (2 - Td or Tdap) 12/20/2025   Pap with HPV screening  05/02/2028   Flu Shot  Completed   Hepatitis C Screening  Completed   HIV Screening  Completed   HPV Vaccine  Aged Out  *Topic was postponed. The date shown is not the original due date.    Follow up in 3 months  If you have any questions or concerns, please do not hesitate to call the office at [phone].  I look forward to our next visit and until then take care and stay safe.  Regards,   Dana Allan, MD   [facility]

## 2024-02-10 ENCOUNTER — Encounter: Payer: Self-pay | Admitting: Family Medicine

## 2024-02-10 NOTE — Assessment & Plan Note (Signed)
 Improved HbA1c from 6.8 to 6.3 despite one week off Jardiance and a course of prednisone. Patient is also on Metformin. Discussed the importance of consistent medication use and the potential for medication reduction with continued lifestyle modifications. -Continue current regimen of Metformin and Jardiance. -Encourage continued exercise and dietary modifications focusing on portion control and increased protein intake.

## 2024-02-10 NOTE — Assessment & Plan Note (Signed)
 Has been actively making changes toward a healthier lifestyle.  -Continue regular exercise regimen at the gym. -Continue to portion control and make healthy choices

## 2024-02-10 NOTE — Assessment & Plan Note (Signed)
 Patient on Pantoprazole 40mg  twice daily with good control of symptoms. Discussed potential long-term side effects of high-dose proton pump inhibitors and proposed a trial of dose reduction with addition of Pepcid. -Start Pepcid 40mg  twice daily for 1-2 weeks. -After 1-2 weeks, reduce Pantoprazole to 40mg  at night. -If symptoms flare, patient may take an extra dose of Pantoprazole.

## 2024-03-01 ENCOUNTER — Other Ambulatory Visit: Payer: Self-pay | Admitting: Family Medicine

## 2024-03-01 DIAGNOSIS — K219 Gastro-esophageal reflux disease without esophagitis: Secondary | ICD-10-CM

## 2024-03-25 DIAGNOSIS — M79671 Pain in right foot: Secondary | ICD-10-CM | POA: Diagnosis not present

## 2024-03-25 DIAGNOSIS — E119 Type 2 diabetes mellitus without complications: Secondary | ICD-10-CM | POA: Diagnosis not present

## 2024-03-25 DIAGNOSIS — D2371 Other benign neoplasm of skin of right lower limb, including hip: Secondary | ICD-10-CM | POA: Diagnosis not present

## 2024-03-27 ENCOUNTER — Encounter: Payer: Self-pay | Admitting: Family Medicine

## 2024-03-28 ENCOUNTER — Telehealth: Admitting: Physician Assistant

## 2024-03-28 ENCOUNTER — Other Ambulatory Visit: Payer: Self-pay | Admitting: Family Medicine

## 2024-03-28 DIAGNOSIS — K219 Gastro-esophageal reflux disease without esophagitis: Secondary | ICD-10-CM

## 2024-03-28 MED ORDER — SUCRALFATE 1 G PO TABS: 1.0000 g | ORAL_TABLET | Freq: Three times a day (TID) | ORAL | 0 refills | Status: DC

## 2024-03-28 MED ORDER — PANTOPRAZOLE SODIUM 40 MG PO TBEC
40.0000 mg | DELAYED_RELEASE_TABLET | Freq: Two times a day (BID) | ORAL | 3 refills | Status: AC
Start: 2024-03-28 — End: ?

## 2024-03-28 NOTE — Progress Notes (Signed)
 E-Visit for Heartburn  We are sorry that you are not feeling well.  Here is how we plan to help!  Based on what you shared with me it looks like you most likely have Gastroesophageal Reflux Disease (GERD)  Gastroesophageal reflux disease (GERD) happens when acid from your stomach flows up into the esophagus.  When acid comes in contact with the esophagus, the acid causes sorenss (inflammation) in the esophagus.  Over time, GERD may create small holes (ulcers) in the lining of the esophagus.  I have prescribed Sucralfate 1g Take 1 tablet 15 minutes before each meal and one at bedtime for next 7 days  Your symptoms should improve in the next day or two.  You can use antacids as needed until symptoms resolve.  Call us if your heartburn worsens, you have trouble swallowing, weight loss, spitting up blood or recurrent vomiting.  Home Care: May include lifestyle changes such as weight loss, quitting smoking and alcohol consumption Avoid foods and drinks that make your symptoms worse, such as: Caffeine or alcoholic drinks Chocolate Peppermint or mint flavorings Garlic and onions Spicy foods Citrus fruits, such as oranges, lemons, or limes Tomato-based foods such as sauce, chili, salsa and pizza Fried and fatty foods Avoid lying down for 3 hours prior to your bedtime or prior to taking a nap Eat small, frequent meals instead of a large meals Wear loose-fitting clothing.  Do not wear anything tight around your waist that causes pressure on your stomach. Raise the head of your bed 6 to 8 inches with wood blocks to help you sleep.  Extra pillows will not help.  Seek Help Right Away If: You have pain in your arms, neck, jaw, teeth or back Your pain increases or changes in intensity or duration You develop nausea, vomiting or sweating (diaphoresis) You develop shortness of breath or you faint Your vomit is green, yellow, black or looks like coffee grounds or blood Your stool is red, bloody or  black  These symptoms could be signs of other problems, such as heart disease, gastric bleeding or esophageal bleeding.  Make sure you : Understand these instructions. Will watch your condition. Will get help right away if you are not doing well or get worse.  Your e-visit answers were reviewed by a board certified advanced clinical practitioner to complete your personal care plan.  Depending on the condition, your plan could have included both over the counter or prescription medications.  If there is a problem please reply  once you have received a response from your provider.  Your safety is important to [place].  If you have drug allergies check your prescription carefully.    You can use MyChart to ask questions about today's visit, request a non-urgent call back, or ask for a work or school excuse for 24 hours related to this e-Visit. If it has been greater than 24 hours you will need to follow up with your provider, or enter a new e-Visit to address those concerns.  You will get an e-mail in the next two days asking about your experience.  I hope that your e-visit has been valuable and will speed your recovery. Thank you for using e-visits.    I have spent 5 minutes in review of e-visit questionnaire, review and updating patient chart, medical decision making and response to patient.   Margaretann Loveless, PA-C

## 2024-04-03 NOTE — Telephone Encounter (Signed)
 Noted.

## 2024-04-14 ENCOUNTER — Encounter: Payer: Self-pay | Admitting: Family Medicine

## 2024-04-14 DIAGNOSIS — G8929 Other chronic pain: Secondary | ICD-10-CM

## 2024-04-14 MED ORDER — GABAPENTIN 100 MG PO CAPS
ORAL_CAPSULE | ORAL | 3 refills | Status: DC
Start: 2024-04-14 — End: 2024-05-06

## 2024-04-14 NOTE — Telephone Encounter (Signed)
 Medication pended.

## 2024-04-23 DIAGNOSIS — G4733 Obstructive sleep apnea (adult) (pediatric): Secondary | ICD-10-CM | POA: Diagnosis not present

## 2024-04-30 ENCOUNTER — Encounter: Payer: Self-pay | Admitting: Family Medicine

## 2024-05-02 NOTE — Telephone Encounter (Signed)
 Referral sent

## 2024-05-02 NOTE — Addendum Note (Signed)
 Addended by: Barb Levers on: 05/02/2024 09:17 AM   Modules accepted: Orders

## 2024-05-04 NOTE — Progress Notes (Signed)
 PCP: Brittany Gaw, MD   Chief Complaint  Patient presents with   Gynecologic Exam    No concerns    HPI:      [name] [id number] is a 64 y.o. G0P0000 who LMP was No LMP recorded (lmp unknown). Patient is postmenopausal., presents today for her annual examination.  Her menses are absent due to menopause. She does not have PMB or pelvic pain. No longer has VS sx, stopped prometrium  last yr. Doing well.   Sex activity: not sexually active. She does not have vaginal dryness/sx.  Last Pap: 05/03/23 Results were:NILM/neg HPV DNA.  Hx of sebaceous gland hyperplasia of labia. They sometimes bleed/"pop". Has lesion RT labia majora.   Last mammogram: 05/10/23 Results were: normal--routine follow-up in 12 months. Had neg breast MRI 12/24. There is a FH of breast cancer in her mom twice and sister now (sister declines genetic testing). Pt is MyRisk neg 2021; IBIS=26.6% (before sister dx). There is no FH of ovarian cancer. There is a FH of prostate and kidney cancer in her mat uncles. The patient does sometimes do self-breast exams. Is taking Vit D supp.   Colonoscopy: 2016 and 2017 with Dr. Peg Bouton;  Repeat done 2021 with  Dr. Ole Berkeley, repeat due again after 5 yrs  Tobacco use: The patient denies current or previous tobacco use. Alcohol use: social No drug use Exercise: mod active   She does get adequate calcium and Vitamin D  in her diet.  Labs with PCP. Normal DEXA 2/23 with PCP   Patient Active Problem List   Diagnosis Date Noted   Need for influenza vaccination 11/29/2023   Dyspnea on exertion 09/15/2023   Chest pain on breathing 09/15/2023   COVID-19 08/29/2023   Arthralgia 07/23/2023   ESR raised 01/22/2023   Epidermoid cyst of neck 01/22/2023   Encounter for vitamin deficiency screening 01/22/2023   Neck muscle strain 01/22/2023   Elevated blood pressure reading 11/13/2022   Left shoulder pain 11/13/2022   Depression, major, single episode, mild (HCC) 05/22/2022    Increased risk of breast cancer 03/06/2022   Sebaceous gland hyperplasia of vulva 03/06/2022   Strain of left wrist 05/17/2021   Acute gastritis without hemorrhage    History of colonic polyps    Stress at work 06/22/2020   Morbid obesity (HCC) 02/19/2020   Right shoulder pain 02/16/2020   Family history of breast cancer 01/15/2020   Vasomotor symptoms due to menopause 01/15/2020   Sinus drainage 01/09/2020   Piriformis syndrome, left 05/08/2019   Bilateral leg edema 04/26/2018   OSA (obstructive sleep apnea) 04/26/2018   Back pain, chronic 07/06/2016   IBS (irritable bowel syndrome) 12/21/2015   GERD (gastroesophageal reflux disease) 04/21/2015   Type 2 diabetes with complication (HCC) 01/20/2015    Past Surgical History:  Procedure Laterality Date   BILATERAL CARPAL TUNNEL RELEASE     BREAST EXCISIONAL BIOPSY Bilateral    benign   BREAST SURGERY     cervical neck fusion     CHOLECYSTECTOMY     COLONOSCOPY  2017   Dr. Peg Bouton with polyp; repeat due 2021   COLONOSCOPY WITH PROPOFOL  N/A 08/12/2020   Procedure: COLONOSCOPY WITH PROPOFOL ;  Surgeon: Marnee Sink, MD;  Location: [facility] ENDOSCOPY;  Service: Endoscopy;  Laterality: N/A;   ENDOVENOUS ABLATION SAPHENOUS VEIN W/ LASER Left 04/15/2020   endovenous laser ablation left greater saphenous vein by Kirtland Perfect MD    ENDOVENOUS ABLATION SAPHENOUS VEIN W/ LASER Left 10/21/2020   endovenous laser  ablation left small saphenous vein by Kirtland Perfect MD    ESOPHAGOGASTRODUODENOSCOPY (EGD) WITH PROPOFOL  N/A 05/12/2016   Procedure: ESOPHAGOGASTRODUODENOSCOPY (EGD) WITH PROPOFOL ;  Surgeon: Deveron Fly, MD;  Location[facility] ENDOSCOPY;  Service: Endoscopy;  Laterality: N/A;   ESOPHAGOGASTRODUODENOSCOPY (EGD) WITH PROPOFOL  N/A 08/12/2020   Procedure: ESOPHAGOGASTRODUODENOSCOPY (EGD) WITH PROPOFOL ;  Surgeon: Marnee Sink, MD;  Location: [facility] ENDOSCOPY;  Service: Endoscopy;  Laterality: N/A;   TONSILLECTOMY Left 09/04/2022   Procedure:  PARTIAL TONSILLECTOMY;  Surgeon: Rogers Clayman, MD;  Location: [facility] ORS;  Service: ENT;  Laterality: Left;    Family History  Problem Relation Age of Onset   Breast cancer Mother        twice--30s and 84 (bilat breasts)   Breast cancer Sister 19   Cancer Sister 20       Blood cancer (unknown)   Hypertension Brother    Cancer Maternal Aunt        Lung   Kidney cancer Maternal Uncle 30   Prostate cancer Maternal Uncle 79       needed seed tx   Heart attack Paternal Uncle    Aneurysm Paternal Grandmother    Prostate cancer Cousin 55   Diabetes Neg Hx     Social History   Socioeconomic History   Marital status: Single    Spouse name: Not on file   Number of children: Not on file   Years of education: Not on file   Highest education level: Not on file  Occupational History   Not on file  Tobacco Use   Smoking status: Never   Smokeless tobacco: Never  Vaping Use   Vaping status: Never Used  Substance and Sexual Activity   Alcohol use: No    Alcohol/week: 0.0 standard drinks of alcohol   Drug use: No   Sexual activity: Not Currently    Birth control/protection: Post-menopausal  Other Topics Concern   Not on file  Social History Narrative   Works- [employer]    Mother lives with pt   No children    1 dog lives inside    Right handed    Caffeine- 1 piece chocolate occasionally    Associate degree   Enjoys- gardening and going to concerts    Social Drivers of Corporate investment banker Strain: Not on file  Food Insecurity: Not on file  Transportation Needs: Not on file  Physical Activity: Not on file  Stress: Not on file  Social Connections: Not on file  Intimate Partner Violence: Not on file     Current Outpatient Medications:    azelastine  (ASTELIN ) 0.1 % nasal spray, Place 1 spray into both nostrils 2 (two) times daily., Disp: 30 mL, Rfl: 2   BIOTIN PO, Take 1 tablet by mouth daily., Disp: , Rfl:    Calcium-Magnesium-Vitamin D  (CALCIUM 500  PO), Take by mouth., Disp: , Rfl:    cetirizine  (ZYRTEC ) 10 MG tablet, Take 1 tablet (10 mg total) by mouth daily., Disp: 30 tablet, Rfl: 1   gabapentin  (NEURONTIN ) 100 MG capsule, Take 1 capsule (100 mg total) by mouth in the morning and 1 capsule at bedtime, Disp: 180 capsule, Rfl: 3   glucose blood test strip, Dispense based on patient preference and insurance preference, Use as once daily Dx: E11.9, Disp: 100 each, Rfl: 12   JARDIANCE  25 MG TABS tablet, TAKE 1 TABLET BY MOUTH DAILY BEFORE BREAKFAST., Disp: 90 tablet, Rfl: 3   Lancet Devices (ONE TOUCH DELICA LANCING DEV)  MISC, Use as directed Dx: E11.9, Disp: 1 each, Rfl: 0   metFORMIN  (GLUCOPHAGE -XR) 500 MG 24 hr tablet, TAKE 2 TABLETS 2 TIMES     DAILY WITH MEALS, Disp: 360 tablet, Rfl: 1   Multiple Vitamin (MULTIVITAMIN) capsule, Take 1 capsule by mouth daily., Disp: , Rfl:    pantoprazole  (PROTONIX ) 40 MG tablet, Take 1 tablet (40 mg total) by mouth 2 (two) times daily., Disp: 180 tablet, Rfl: 3   sucralfate  (CARAFATE ) 1 g tablet, Take 1 tablet (1 g total) by mouth 4 (four) times daily -  with meals and at bedtime for 7 days., Disp: 28 tablet, Rfl: 0     ROS:  Review of Systems  Constitutional:  Negative for fatigue, fever and unexpected weight change.  Respiratory:  Negative for cough, shortness of breath and wheezing.   Cardiovascular:  Negative for chest pain, palpitations and leg swelling.  Gastrointestinal:  Negative for blood in stool, constipation, diarrhea, nausea and vomiting.  Endocrine: Negative for cold intolerance, heat intolerance and polyuria.  Genitourinary:  Negative for dyspareunia, dysuria, flank pain, frequency, genital sores, hematuria, menstrual problem, pelvic pain, urgency, vaginal bleeding, vaginal discharge and vaginal pain.  Musculoskeletal:  Negative for arthralgias, back pain, joint swelling and myalgias.  Skin:  Negative for rash.  Neurological:  Negative for dizziness, syncope, light-headedness,  numbness and headaches.  Hematological:  Negative for adenopathy.  Psychiatric/Behavioral:  Negative for agitation, confusion, sleep disturbance and suicidal ideas. The patient is not nervous/anxious.    BREAST: No symptoms    Objective: BP 111/73   Pulse 86   Ht 5' 3.5" (1.613 m)   Wt 202 lb (91.6 kg)   LMP  (LMP Unknown) Comment: Patient states she had her "uterus scraped" sometime in the past 10 years.   BMI 35.22 kg/m    Physical Exam Constitutional:      Appearance: She is well-developed.  Genitourinary:     Vulva normal.     Right Labia: lesions.     Right Labia: No rash or tenderness.    Left Labia: No tenderness, lesions or rash.       No vaginal discharge, erythema or tenderness.      Right Adnexa: not tender and no mass present.    Left Adnexa: not tender and no mass present.    No cervical friability or polyp.     Uterus is not enlarged or tender.  Breasts:    Right: No mass, nipple discharge, skin change or tenderness.     Left: No mass, nipple discharge, skin change or tenderness.  Neck:     Thyroid : No thyromegaly.  Cardiovascular:     Rate and Rhythm: Normal rate and regular rhythm.     Heart sounds: Normal heart sounds. No murmur heard. Pulmonary:     Effort: Pulmonary effort is normal.     Breath sounds: Normal breath sounds.  Abdominal:     Palpations: Abdomen is soft.     Tenderness: There is no abdominal tenderness. There is no guarding or rebound.  Musculoskeletal:        General: Normal range of motion.     Cervical back: Normal range of motion.  Lymphadenopathy:     Cervical: No cervical adenopathy.  Neurological:     General: No focal deficit present.     Mental Status: She is alert and oriented to person, place, and time.     Cranial Nerves: No cranial nerve deficit.  Skin:    General: Skin is  warm and dry.  Psychiatric:        Mood and Affect: Mood normal.        Behavior: Behavior normal.        Thought Content: Thought content  normal.        Judgment: Judgment normal.  Vitals reviewed.    Assessment/Plan:  Encounter for annual routine gynecological examination  Encounter for screening mammogram for malignant neoplasm of breast - Plan: MM 3D SCREENING MAMMOGRAM BILATERAL BREAST; pt to schedule mammo  Family history of breast cancer - Plan: MM 3D SCREENING MAMMOGRAM BILATERAL BREAST; pt is MyRisk neg  Increased risk of breast cancer - Plan: MM 3D SCREENING MAMMOGRAM BILATERAL BREAST; aware of recommendations of monthly SBE, yearly CBE and mammos, as well as scr breast MRI. Pt to call for MRI ref prn.           GYN counsel mammography screening, menopause, adequate intake of calcium and vitamin D , diet and exercise    F/U  Return in about 1 year (around 05/05/2025).  Chantz Montefusco B. Margarite Vessel, PA-C 05/05/2024 11:13 AM

## 2024-05-05 ENCOUNTER — Ambulatory Visit: Admitting: Obstetrics and Gynecology

## 2024-05-05 ENCOUNTER — Encounter: Payer: Self-pay | Admitting: Obstetrics and Gynecology

## 2024-05-05 VITALS — BP 111/73 | HR 86 | Ht 63.5 in | Wt 202.0 lb

## 2024-05-05 DIAGNOSIS — Z01419 Encounter for gynecological examination (general) (routine) without abnormal findings: Secondary | ICD-10-CM | POA: Diagnosis not present

## 2024-05-05 DIAGNOSIS — Z9189 Other specified personal risk factors, not elsewhere classified: Secondary | ICD-10-CM

## 2024-05-05 DIAGNOSIS — Z803 Family history of malignant neoplasm of breast: Secondary | ICD-10-CM

## 2024-05-05 DIAGNOSIS — Z1231 Encounter for screening mammogram for malignant neoplasm of breast: Secondary | ICD-10-CM

## 2024-05-05 DIAGNOSIS — N951 Menopausal and female climacteric states: Secondary | ICD-10-CM

## 2024-05-05 NOTE — Patient Instructions (Addendum)
 I value your feedback and you entrusting [place] with your care. If you get a [facility] patient survey, I would appreciate you taking the time to let us know about your experience today. Thank you!  [facility] ([place])--[phone]

## 2024-05-06 ENCOUNTER — Ambulatory Visit (INDEPENDENT_AMBULATORY_CARE_PROVIDER_SITE_OTHER): Payer: BC Managed Care – PPO | Admitting: Family Medicine

## 2024-05-06 ENCOUNTER — Encounter: Payer: Self-pay | Admitting: Family Medicine

## 2024-05-06 ENCOUNTER — Telehealth: Payer: Self-pay

## 2024-05-06 VITALS — BP 130/70 | HR 78 | Temp 97.9°F | Resp 20 | Ht 64.0 in | Wt 201.4 lb

## 2024-05-06 DIAGNOSIS — E559 Vitamin D deficiency, unspecified: Secondary | ICD-10-CM

## 2024-05-06 DIAGNOSIS — E118 Type 2 diabetes mellitus with unspecified complications: Secondary | ICD-10-CM | POA: Diagnosis not present

## 2024-05-06 DIAGNOSIS — K219 Gastro-esophageal reflux disease without esophagitis: Secondary | ICD-10-CM | POA: Diagnosis not present

## 2024-05-06 DIAGNOSIS — M5441 Lumbago with sciatica, right side: Secondary | ICD-10-CM

## 2024-05-06 DIAGNOSIS — E538 Deficiency of other specified B group vitamins: Secondary | ICD-10-CM | POA: Diagnosis not present

## 2024-05-06 DIAGNOSIS — G8929 Other chronic pain: Secondary | ICD-10-CM

## 2024-05-06 DIAGNOSIS — Z7984 Long term (current) use of oral hypoglycemic drugs: Secondary | ICD-10-CM

## 2024-05-06 DIAGNOSIS — Z13220 Encounter for screening for lipoid disorders: Secondary | ICD-10-CM | POA: Diagnosis not present

## 2024-05-06 DIAGNOSIS — K2900 Acute gastritis without bleeding: Secondary | ICD-10-CM

## 2024-05-06 DIAGNOSIS — Z860100 Personal history of colon polyps, unspecified: Secondary | ICD-10-CM

## 2024-05-06 LAB — COMPREHENSIVE METABOLIC PANEL WITH GFR
ALT: 15 U/L (ref 0–35)
AST: 19 U/L (ref 0–37)
Albumin: 4.2 g/dL (ref 3.5–5.2)
Alkaline Phosphatase: 56 U/L (ref 39–117)
BUN: 15 mg/dL (ref 6–23)
CO2: 30 meq/L (ref 19–32)
Calcium: 9.5 mg/dL (ref 8.4–10.5)
Chloride: 101 meq/L (ref 96–112)
Creatinine, Ser: 0.66 mg/dL (ref 0.40–1.20)
GFR: 92.85 mL/min (ref >60.00)
Glucose, Bld: 156 mg/dL — ABNORMAL HIGH (ref 70–99)
Potassium: 3.8 meq/L (ref 3.5–5.1)
Sodium: 141 meq/L (ref 135–145)
Total Bilirubin: 0.5 mg/dL (ref 0.2–1.2)
Total Protein: 7.2 g/dL (ref 6.0–8.3)

## 2024-05-06 LAB — CBC WITH DIFFERENTIAL/PLATELET
Basophils Absolute: 0 10*3/uL (ref 0.0–0.1)
Basophils Relative: 0.5 % (ref 0.0–3.0)
Eosinophils Absolute: 0.8 10*3/uL — ABNORMAL HIGH (ref 0.0–0.7)
Eosinophils Relative: 12.3 % — ABNORMAL HIGH (ref 0.0–5.0)
HCT: 42.5 % (ref 36.0–46.0)
Hemoglobin: 14.2 g/dL (ref 12.0–15.0)
Lymphocytes Relative: 22.4 % (ref 12.0–46.0)
Lymphs Abs: 1.5 10*3/uL (ref 0.7–4.0)
MCHC: 33.4 g/dL (ref 30.0–36.0)
MCV: 90.9 fl (ref 78.0–100.0)
Monocytes Absolute: 0.5 10*3/uL (ref 0.1–1.0)
Monocytes Relative: 7.9 % (ref 3.0–12.0)
Neutro Abs: 3.9 10*3/uL (ref 1.4–7.7)
Neutrophils Relative %: 56.9 % (ref 43.0–77.0)
Platelets: 214 10*3/uL (ref 150.0–400.0)
RBC: 4.68 Mil/uL (ref 3.87–5.11)
RDW: 14.6 % (ref 11.5–15.5)
WBC: 6.8 10*3/uL (ref 4.0–10.5)

## 2024-05-06 LAB — LIPID PANEL
Cholesterol: 132 mg/dL (ref 0–200)
HDL: 55.6 mg/dL (ref >39.00)
LDL Cholesterol: 59 mg/dL (ref 0–99)
NonHDL: 76.2
Total CHOL/HDL Ratio: 2
Triglycerides: 85 mg/dL (ref 0.0–149.0)
VLDL: 17 mg/dL (ref 0.0–40.0)

## 2024-05-06 LAB — VITAMIN B12: Vitamin B-12: >1500 pg/mL — ABNORMAL HIGH (ref 211–911)

## 2024-05-06 LAB — MICROALBUMIN / CREATININE URINE RATIO
Creatinine,U: 62.4 mg/dL
Microalb Creat Ratio: 24.7 mg/g (ref 0.0–30.0)
Microalb, Ur: 1.5 mg/dL (ref 0.0–1.9)

## 2024-05-06 LAB — HEMOGLOBIN A1C: Hgb A1c MFr Bld: 6.4 % (ref 4.6–6.5)

## 2024-05-06 LAB — VITAMIN D 25 HYDROXY (VIT D DEFICIENCY, FRACTURES): VITD: 40.02 ng/mL (ref 30.00–100.00)

## 2024-05-06 MED ORDER — METFORMIN HCL ER 500 MG PO TB24: 500.0000 mg | ORAL_TABLET | Freq: Two times a day (BID) | ORAL | 1 refills | Status: DC

## 2024-05-06 MED ORDER — EMPAGLIFLOZIN 25 MG PO TABS: 25.0000 mg | ORAL_TABLET | Freq: Every day | ORAL | 3 refills | Status: AC

## 2024-05-06 MED ORDER — GABAPENTIN 100 MG PO CAPS: ORAL_CAPSULE | ORAL | 3 refills | Status: AC

## 2024-05-06 MED ORDER — EMPAGLIFLOZIN 25 MG PO TABS: 25.0000 mg | ORAL_TABLET | Freq: Every day | ORAL | 3 refills | Status: DC

## 2024-05-06 MED ORDER — SUCRALFATE 1 GM/10ML PO SUSP: 1.0000 g | Freq: Three times a day (TID) | ORAL | 3 refills | Status: DC

## 2024-05-06 NOTE — Telephone Encounter (Signed)
*  Primary  Pharmacy Patient Advocate Encounter   Received notification from CoverMyMeds that prior authorization for Sucralfate  1GM/10ML suspension  is required/requested.   Insurance verification completed.   The patient is insured through [facility] .   Per test claim:  Sucralfate  tablets is preferred by the insurance.  If suggested medication is appropriate, Please send in a new RX and discontinue this one. If not, please advise as to why it's not appropriate so that we may request a Prior Authorization. Please note, some preferred medications may still require a PA.  If the suggested medications have not been trialed and there are no contraindications to their use, the PA will not be submitted, as it will not be approved.   CMM Key: [id number]

## 2024-05-06 NOTE — Addendum Note (Signed)
 Addended by: Barb Levers on: 05/06/2024 08:21 AM   Modules accepted: Orders

## 2024-05-06 NOTE — Progress Notes (Unsigned)
 SUBJECTIVE:   Chief Complaint  Patient presents with   Gastroesophageal Reflux   Medical Management of Chronic Issues    3 month follow   HPI Presents for follow up chronic disease management  Discussed the use of AI scribe software for clinical note transcription with the patient, who gave verbal consent to proceed.  History of Present Illness Brittany Santos is a 63 year old female with gastroesophageal reflux disease who presents for a three-month follow-up and evaluation of persistent heartburn.  She experiences persistent heartburn that is not relieved by current medications, including Protonix  taken twice daily and Carafate . The heartburn occurs regardless of food intake and has been severe enough to require a GI cocktail at an emergency clinic. She describes a burning sensation in her esophagus, accompanied by a persistent taste and discomfort. Despite dietary changes to avoid fatty, spicy, and certain other foods, symptoms persist. No vomiting, except for phlegm, and no abdominal pain are reported. She has experienced some weight loss, which she attributes to dietary changes.  She has a history of diabetes and is currently on Metformin  and Jardiance . She also takes Gaviscon and Maalox for her heartburn, which provide temporary relief. She avoids NSAIDs like ibuprofen and takes her medications with food to minimize gastrointestinal discomfort.  She experiences sciatica pain in her right leg, for which she takes Gabapentin  daily. She has tried various treatments, including injections and physical therapy, without significant relief. Recently, she has been attending chiropractic sessions for stretching, which she finds beneficial. She has a history of spinal issues, including a past fall resulting in a compound fracture and tailbone injury. She takes Vitamin D  and calcium supplements for bone health.  No chest pain, shortness of breath, or abdominal pain. She reports frequent  belching and occasional coughing up phlegm. She confirms regular bowel movements and no recent changes in her blood pressure.     PERTINENT PMH / PSH: As above  OBJECTIVE:  BP 130/70   Pulse 78   Temp 97.9 F (36.6 C)   Resp 20   Ht 5\' 4"  (1.626 m)   Wt 201 lb 6 oz (91.3 kg)   LMP  (LMP Unknown) Comment: Patient states she had her "uterus scraped" sometime in the past 10 years.   SpO2 98%   BMI 34.57 kg/m    Physical Exam Vitals reviewed.  Constitutional:      General: She is not in acute distress.    Appearance: Normal appearance. She is obese. She is not ill-appearing, toxic-appearing or diaphoretic.  Eyes:     General:        Right eye: No discharge.        Left eye: No discharge.     Conjunctiva/sclera: Conjunctivae normal.  Cardiovascular:     Rate and Rhythm: Normal rate and regular rhythm.     Heart sounds: Normal heart sounds.  Pulmonary:     Effort: Pulmonary effort is normal.     Breath sounds: Normal breath sounds.  Abdominal:     General: Bowel sounds are normal.  Musculoskeletal:        General: Normal range of motion.  Skin:    General: Skin is warm and dry.  Neurological:     General: No focal deficit present.     Mental Status: She is alert and oriented to person, place, and time. Mental status is at baseline.  Psychiatric:        Mood and Affect: Mood normal.  Behavior: Behavior normal.        Thought Content: Thought content normal.        Judgment: Judgment normal.           05/06/2024    7:55 AM 11/26/2023   10:34 AM 08/29/2023   12:52 PM 07/23/2023    3:21 PM 11/13/2022   11:26 AM  Depression screen PHQ 2/9  Decreased Interest 0 0 0 0 1  Down, Depressed, Hopeless 0 0 0 0 0  PHQ - 2 Score 0 0 0 0 1  Altered sleeping 0 0 0 2   Tired, decreased energy 0 0 0 2   Change in appetite 0 0 0 0   Feeling bad or failure about yourself  0 0 0 0   Trouble concentrating 0 0 0 0   Moving slowly or fidgety/restless 0 0 0 0   Suicidal  thoughts 0 0 0 0   PHQ-9 Score 0 0 0 4   Difficult doing work/chores Not difficult at all Not difficult at all Not difficult at all Not difficult at all       05/06/2024    7:56 AM 11/26/2023   10:34 AM 08/29/2023   12:53 PM 07/23/2023    3:21 PM  GAD 7 : Generalized Anxiety Score  Nervous, Anxious, on Edge 0 0 0 0  Control/stop worrying 0 0 0 0  Worry too much - different things 0 0 0 0  Trouble relaxing 0 0 0 0  Restless 0 0 0 0  Easily annoyed or irritable 0 0 0 0  Afraid - awful might happen 0 0 0 0  Total GAD 7 Score 0 0 0 0  Anxiety Difficulty Not difficult at all Not difficult at all Not difficult at all Not difficult at all    ASSESSMENT/PLAN:  Type 2 diabetes with complication (HCC) Assessment & Plan: HbA1c from 6.4  Tolerating current medications. -Refill Jardiance  -Refill Metformin  -Encourage continued exercise and dietary modifications focusing on portion control and increased protein intake.  Orders: -     Hemoglobin A1c -     Comprehensive metabolic panel with GFR -     CBC with Differential/Platelet -     Microalbumin / creatinine urine ratio -     metFORMIN  HCl ER; Take 1 tablet (500 mg total) by mouth 2 (two) times daily with a meal.  Dispense: 360 tablet; Refill: 1 -     Empagliflozin ; Take 1 tablet (25 mg total) by mouth daily before breakfast.  Dispense: 90 tablet; Refill: 3  Morbid obesity (HCC) Assessment & Plan: Has been actively making changes toward a healthier lifestyle.  -Continue regular exercise regimen at the gym. -Continue to portion control and make healthy choices   Gastroesophageal reflux disease, unspecified whether esophagitis present -     Sucralfate ; Take 1 tablet (1 g total) by mouth 4 (four) times daily -  with meals and at bedtime.  Dispense: 120 tablet; Refill: 1  Vitamin D  deficiency -     VITAMIN D  25 Hydroxy (Vit-D Deficiency, Fractures)  Vitamin B 12 deficiency -     Vitamin B12  Lipid screening -     Lipid  panel  Chronic right-sided low back pain with right-sided sciatica Assessment & Plan: Chronic right leg sciatica managed with gabapentin . Improvement with chiropractic exercises. Previous treatments ineffective. - Continue gabapentin  as currently prescribed. - Continue chiropractic stretching exercises twice a week.  Orders: -     Gabapentin ; Take 1 capsule (  100 mg total) by mouth in the morning and 1 capsule at bedtime  Dispense: 180 capsule; Refill: 3  Acute gastritis without hemorrhage, unspecified gastritis type Assessment & Plan: Persistent GERD symptoms despite Protonix  and Carafate . No relief from Gaviscon or Maalox. Nexium caused irritation. - Refer to  GI evaluation and possible endoscopy. - Add Carafate  liquid, trial four times a day. - Continue Protonix  twice daily. - Order labs to assess current status. - Referral sent to GI   History of colonic polyps Assessment & Plan: Colonoscopy every 5 years per Dr Ole Berkeley       PDMP reviewed  Return if symptoms worsen or fail to improve, for PCP.  Valli Gaw, MD

## 2024-05-06 NOTE — Patient Instructions (Addendum)
 It was a pleasure meeting you today. Thank you for allowing me to take part in your health care.  Our goals for today as we discussed include:  We will get some labs today.  If they are abnormal or we need to do something about them, I will call you.  If they are normal, I will send you a message on MyChart (if it is active) or a letter in the mail.  If you don't hear from us  in 2 weeks, please call the office at the number below.    Referral resent to GI for worsening heart burn  Refills sent for requested medications    This is a list of the screening recommended for you and due dates:  Health Maintenance  Topic Date Due   Zoster (Shingles) Vaccine (1 of 2) Never done   COVID-19 Vaccine (5 - 2024-25 season) 08/19/2023   Pneumococcal Vaccination (3 of 3 - PCV20 or PCV21) 12/22/2023   Eye exam for diabetics  05/02/2024   Flu Shot  07/18/2024   Hemoglobin A1C  08/04/2024   Yearly kidney function blood test for diabetes  09/05/2024   Yearly kidney health urinalysis for diabetes  11/25/2024   Complete foot exam   11/25/2024   Colon Cancer Screening  08/12/2025   Mammogram  11/29/2025   DTaP/Tdap/Td vaccine (2 - Td or Tdap) 12/20/2025   Pap with HPV screening  05/02/2028   Hepatitis C Screening  Completed   HIV Screening  Completed   HPV Vaccine  Aged Out   Meningitis B Vaccine  Aged Out      If you have any questions or concerns, please do not hesitate to call the office at [phone].  I look forward to our next visit and until then take care and stay safe.  Regards,   Valli Gaw, MD   [facility]

## 2024-05-07 ENCOUNTER — Telehealth: Payer: Self-pay

## 2024-05-07 NOTE — Telephone Encounter (Signed)
 Pt needs PA for sucralfate  (CARAFATE ) 1 GM/10ML suspension.

## 2024-05-07 NOTE — Telephone Encounter (Signed)
 Copied from CRM [id number]. Topic: General - Other >> May 07, 2024  4:16 PM Howard Macho wrote: Reason for CRM: patient called stating her insurance want fill her heartburn medication until they get an explanation from the doctor and this was yesterday. Patient stated she can not get the medication she need also the doctor put in a referral to [facility] clinic and she can not get into an appointment until October CB [phone]

## 2024-05-07 NOTE — Addendum Note (Signed)
 Addended by: Yliana Gravois on: 05/07/2024 04:39 PM   Modules accepted: Orders

## 2024-05-08 ENCOUNTER — Telehealth: Payer: Self-pay | Admitting: Family Medicine

## 2024-05-08 NOTE — Telephone Encounter (Signed)
 See previous message

## 2024-05-08 NOTE — Telephone Encounter (Signed)
 Copied from CRM [id number]. Topic: Referral - Prior Authorization Question >> May 07, 2024  5:35 PM Jim Motts C wrote: Reason for CRM: Rep from [employer] called in to state that the patient needs a prior authorization for the prescription sucralfate .  She provided the number [phone] for call back.

## 2024-05-08 NOTE — Telephone Encounter (Signed)
 Copied from CRM [id number]. Topic: General - Call Back - No Documentation >> May 08, 2024 11:52 AM Sasha H wrote: Reason for CRM: pt called in returning [facility] call

## 2024-05-08 NOTE — Telephone Encounter (Signed)
 Left message to return call to our office.

## 2024-05-08 NOTE — Telephone Encounter (Signed)
 Called and spoke to Pt she paid out of pocket for the liquid. She said that if she has to send in the tablets.

## 2024-05-11 ENCOUNTER — Ambulatory Visit: Payer: Self-pay | Admitting: Family Medicine

## 2024-05-11 ENCOUNTER — Encounter: Payer: Self-pay | Admitting: Family Medicine

## 2024-05-11 DIAGNOSIS — E538 Deficiency of other specified B group vitamins: Secondary | ICD-10-CM | POA: Insufficient documentation

## 2024-05-11 DIAGNOSIS — E559 Vitamin D deficiency, unspecified: Secondary | ICD-10-CM | POA: Insufficient documentation

## 2024-05-11 DIAGNOSIS — Z13220 Encounter for screening for lipoid disorders: Secondary | ICD-10-CM | POA: Insufficient documentation

## 2024-05-11 MED ORDER — SUCRALFATE 1 G PO TABS: 1.0000 g | ORAL_TABLET | Freq: Three times a day (TID) | ORAL | 1 refills | Status: DC

## 2024-05-11 NOTE — Assessment & Plan Note (Signed)
 HbA1c from 6.4  Tolerating current medications. -Refill Jardiance  -Refill Metformin  -Encourage continued exercise and dietary modifications focusing on portion control and increased protein intake.

## 2024-05-11 NOTE — Assessment & Plan Note (Signed)
 Colonoscopy every 5 years per Dr Ole Berkeley

## 2024-05-11 NOTE — Assessment & Plan Note (Signed)
 Has been actively making changes toward a healthier lifestyle.  -Continue regular exercise regimen at the gym. -Continue to portion control and make healthy choices

## 2024-05-11 NOTE — Assessment & Plan Note (Deleted)
 Colonoscopy every 5 years at [facility]

## 2024-05-11 NOTE — Assessment & Plan Note (Signed)
 Persistent GERD symptoms despite Protonix  and Carafate . No relief from Gaviscon or Maalox. Nexium caused irritation. - Refer to  GI evaluation and possible endoscopy. - Add Carafate  liquid, trial four times a day. - Continue Protonix  twice daily. - Order labs to assess current status. - Referral sent to GI

## 2024-05-11 NOTE — Assessment & Plan Note (Signed)
 Chronic right leg sciatica managed with gabapentin . Improvement with chiropractic exercises. Previous treatments ineffective. - Continue gabapentin  as currently prescribed. - Continue chiropractic stretching exercises twice a week.

## 2024-05-13 ENCOUNTER — Telehealth: Payer: Self-pay

## 2024-05-13 ENCOUNTER — Other Ambulatory Visit (HOSPITAL_COMMUNITY): Payer: Self-pay

## 2024-05-13 NOTE — Telephone Encounter (Signed)
 PA request has been Submitted. New Encounter has been or will be created for follow up. For additional info see Pharmacy Prior Auth telephone encounter from 05/13/2024.

## 2024-05-13 NOTE — Telephone Encounter (Signed)
 Pharmacy Patient Advocate Encounter   Received notification from Pt Calls Messages that prior authorization for Sucralfate  1GM/10ML suspension is required/requested.   Insurance verification completed.   The patient is insured through [facility] .   Per test claim: PA required; PA submitted to above mentioned insurance via CoverMyMeds Key/confirmation #/EOC [id number] Status is pending

## 2024-05-14 ENCOUNTER — Other Ambulatory Visit (HOSPITAL_COMMUNITY): Payer: Self-pay

## 2024-05-14 ENCOUNTER — Telehealth: Payer: Self-pay

## 2024-05-14 NOTE — Telephone Encounter (Signed)
 Pharmacy Patient Advocate Encounter  Received notification from [facility] that Prior Authorization for Sucralfate  1GM/10ML suspension has been APPROVED from 05/13/2024 to 05/13/2025   PA #/Case ID/Reference #: [id number]

## 2024-05-14 NOTE — Telephone Encounter (Signed)
 Copied from CRM [id number]. Topic: General - Other >> May 14, 2024 10:52 AM Brittany Santos wrote: Reason for CRM: Returning phone call to [facility] for her lab results, nothing else notated. Wants to know if she needs a 3 month follow up or a 42-month follow up, because she was told it would be based on the results of these labs.  Also wants to know if she is going to be seeing Dr. Viktoria Gray now and does NOT wish to do another "meet and greet type situation, it should be a transfer of care because it is not her fault the dr left"

## 2024-05-15 ENCOUNTER — Ambulatory Visit
Admission: RE | Admit: 2024-05-15 | Discharge: 2024-05-15 | Disposition: A | Discharge status: Home / Self Care | Source: Ambulatory Visit | Attending: Obstetrics and Gynecology | Admitting: Obstetrics and Gynecology

## 2024-05-15 DIAGNOSIS — Z803 Family history of malignant neoplasm of breast: Secondary | ICD-10-CM | POA: Insufficient documentation

## 2024-05-15 DIAGNOSIS — Z9189 Other specified personal risk factors, not elsewhere classified: Secondary | ICD-10-CM | POA: Diagnosis not present

## 2024-05-15 DIAGNOSIS — Z1231 Encounter for screening mammogram for malignant neoplasm of breast: Secondary | ICD-10-CM | POA: Insufficient documentation

## 2024-05-16 ENCOUNTER — Other Ambulatory Visit: Payer: Self-pay | Admitting: Family Medicine

## 2024-05-16 MED ORDER — SUCRALFATE 1 GM/10ML PO SUSP: 1.0000 g | Freq: Three times a day (TID) | ORAL | 3 refills | Status: AC

## 2024-05-16 NOTE — Telephone Encounter (Signed)
 See PA noted stated medication was approved.

## 2024-05-16 NOTE — Telephone Encounter (Signed)
 Called and let pt know that she can follow up in 6 months

## 2024-05-16 NOTE — Addendum Note (Signed)
 Addended by: Barb Levers on: 05/16/2024 03:26 PM   Modules accepted: Orders

## 2024-05-19 ENCOUNTER — Telehealth: Payer: Self-pay

## 2024-05-19 ENCOUNTER — Ambulatory Visit
Admission: EM | Admit: 2024-05-19 | Discharge: 2024-05-19 | Disposition: A | Discharge status: Home / Self Care | Attending: Emergency Medicine | Admitting: Emergency Medicine

## 2024-05-19 ENCOUNTER — Encounter: Payer: Self-pay | Admitting: Emergency Medicine

## 2024-05-19 DIAGNOSIS — M25511 Pain in right shoulder: Secondary | ICD-10-CM

## 2024-05-19 NOTE — Telephone Encounter (Signed)
 Copied from CRM [id number]. Topic: General - Other >> May 19, 2024 11:09 AM Annelle Kiel wrote: Reason for CRM: patient is needing a call back regarding a gi referral she would like a call back regarding this

## 2024-05-19 NOTE — Addendum Note (Signed)
 Addended by: Sedonia Kitner on: 05/19/2024 11:37 AM   Modules accepted: Orders

## 2024-05-19 NOTE — ED Triage Notes (Signed)
 Pt presents with right shoulder pain that radiates down her right arm x 3 weeks. Pt denies any injury and has taken ibuprofen for the pain.

## 2024-05-19 NOTE — ED Provider Notes (Signed)
 Pt not seen formally- sent her mother to the ED. Pt has no evidence of life threatnening emergency Suspect bursitis. F/u with [facility].    Ethlyn Herd, MD 05/21/24 1445

## 2024-05-19 NOTE — Discharge Instructions (Signed)
 Try 1000 mg of tylenol  combined with 600 mg of ibuprogren 3-4 times a day. Sleep with a pillow between body and arm to help decompress your shoulder. Follow up with [facility].

## 2024-05-19 NOTE — Telephone Encounter (Signed)
 Resent referral to [facility] in [place] after speaking to the pt.

## 2024-05-20 ENCOUNTER — Ambulatory Visit: Payer: Self-pay | Admitting: Obstetrics and Gynecology

## 2024-05-20 DIAGNOSIS — M7551 Bursitis of right shoulder: Secondary | ICD-10-CM | POA: Diagnosis not present

## 2024-05-20 DIAGNOSIS — E119 Type 2 diabetes mellitus without complications: Secondary | ICD-10-CM | POA: Diagnosis not present

## 2024-05-27 ENCOUNTER — Telehealth: Payer: Self-pay | Admitting: Gastroenterology

## 2024-05-27 NOTE — Telephone Encounter (Signed)
 Patient may be accepted for transfer of care from previous [facility] GI provider to [facility]. She can be scheduled with APP or myself next available. Thanks. GM

## 2024-05-27 NOTE — Telephone Encounter (Signed)
 Good afternoon Dr. Brice Campi  The following patient is requesting to establish care here. She has been a patient with [facility] and after announcing their transition, she has been trying to get clarity on how to schedule with us  and no one has been answering or returning her call. She did advise that she did not want to schedule with [facility] and that it was scheduled prior to the closing. Records are available in Epic. Please review and advise of scheduling. Thank you.

## 2024-05-28 ENCOUNTER — Encounter: Payer: Self-pay | Admitting: Gastroenterology

## 2024-05-28 NOTE — Telephone Encounter (Signed)
 Left vm for patient to call and schedule ov

## 2024-06-02 DIAGNOSIS — E119 Type 2 diabetes mellitus without complications: Secondary | ICD-10-CM | POA: Diagnosis not present

## 2024-06-02 DIAGNOSIS — H57813 Brow ptosis, bilateral: Secondary | ICD-10-CM | POA: Diagnosis not present

## 2024-06-02 DIAGNOSIS — H02403 Unspecified ptosis of bilateral eyelids: Secondary | ICD-10-CM | POA: Diagnosis not present

## 2024-06-02 DIAGNOSIS — H2512 Age-related nuclear cataract, left eye: Secondary | ICD-10-CM | POA: Diagnosis not present

## 2024-06-02 LAB — HM DIABETES EYE EXAM

## 2024-06-24 ENCOUNTER — Other Ambulatory Visit: Payer: Self-pay | Admitting: Family

## 2024-06-24 DIAGNOSIS — J309 Allergic rhinitis, unspecified: Secondary | ICD-10-CM

## 2024-07-10 ENCOUNTER — Ambulatory Visit: Admitting: Gastroenterology

## 2024-07-15 ENCOUNTER — Other Ambulatory Visit: Payer: Self-pay

## 2024-07-15 ENCOUNTER — Telehealth: Payer: Self-pay

## 2024-07-15 DIAGNOSIS — E118 Type 2 diabetes mellitus with unspecified complications: Secondary | ICD-10-CM

## 2024-07-15 NOTE — Telephone Encounter (Signed)
 Called pt and she stated that she was taking 2 pills twice a day, and Dr. Hope did not say anything about cutting the metformin  down any. Her A1C went up from 01/2024 to 04/2024 she was wondering why she would go down in dose, and not let her know.

## 2024-07-15 NOTE — Telephone Encounter (Signed)
 Sent Patient a Well[facility] geologist.

## 2024-07-15 NOTE — Telephone Encounter (Signed)
 Copied from CRM [id number]. Topic: Clinical - Medication Question >> Jul 15, 2024  8:49 AM Revonda D wrote: Reason for CRM: Pt stated that she is supposed to have another refill for the metFORMIN  (GLUCOPHAGE -XR) 500 MG 24 hr tablet. Pt stated that before Dr.Walsh left she stated that she was going to do a year supply for the Metformin  but the pharmacy stated that she only has 1 refill left of a 180 day supply. Pt stated that the pharmacy faxed over a request to Dr.Bair for the medication to be refilled and is waiting on a response. Pt would like a callback with an update on the medication.

## 2024-07-18 ENCOUNTER — Other Ambulatory Visit: Payer: Self-pay

## 2024-07-18 DIAGNOSIS — E118 Type 2 diabetes mellitus with unspecified complications: Secondary | ICD-10-CM

## 2024-07-18 MED ORDER — METFORMIN HCL ER 500 MG PO TB24: 500.0000 mg | ORAL_TABLET | Freq: Two times a day (BID) | ORAL | 1 refills | Status: DC

## 2024-07-18 NOTE — Telephone Encounter (Signed)
 Called [facility] and spoke with Isaiah to give her the correct dosing on the Metformin . Patient is to take 2 tablets which equal 1000 mg 2 times a day.

## 2024-07-18 NOTE — Telephone Encounter (Unsigned)
 Copied from CRM [id number]. Topic: Clinical - Medication Question >> Jul 18, 2024  2:55 PM Martinique E wrote: Reason for CRM: Isaiah from [facility] was returning a call from [place] in regards to the patient's metFORMIN  (GLUCOPHAGE -XR) 500 MG 24 hr tablet. Callback number for Isaiah is [phone].

## 2024-07-18 NOTE — Telephone Encounter (Signed)
 Spoke to Patient she states that since Dr. Maribeth she has taken 1000 mg of Metformin  2 x a day and Jardiance  25 mg once a day.

## 2024-07-18 NOTE — Telephone Encounter (Signed)
 On patient chart review there is no change in her diabetic medication.  She is supposed to take metformin  500 mg twice a day and Jardiance  25 mg once a day.

## 2024-07-18 NOTE — Telephone Encounter (Signed)
 Please inform patient the last refill was 500 mg twice a day by Dr. Hope but if there was no discussion regarding change in the dosage, she can continue with 1000 mg twice a day. We will send the refill.

## 2024-07-29 ENCOUNTER — Other Ambulatory Visit: Payer: Self-pay

## 2024-07-29 DIAGNOSIS — E118 Type 2 diabetes mellitus with unspecified complications: Secondary | ICD-10-CM

## 2024-07-29 MED ORDER — METFORMIN HCL ER 500 MG PO TB24: 500.0000 mg | ORAL_TABLET | Freq: Two times a day (BID) | ORAL | 1 refills | Status: DC

## 2024-07-29 NOTE — Telephone Encounter (Signed)
 Copied from CRM [id number]. Topic: Clinical - Prescription Issue >> Jul 29, 2024  1:54 PM Mesmerise C wrote: Reason for CRM: Patient stating having issues with pharmacy to get her Metformin  requesting it be resent to [facility] due to the back and fourth and trying to get a hold of them

## 2024-07-29 NOTE — Telephone Encounter (Signed)
 Spoke with [facility] and was informed if the patient was able to have the prescription transferred to [facility]. Informed from [facility] patient would need to have [facility] request it on behalf of the patient. Patient was notified and verbalized understanding.

## 2024-07-29 NOTE — Telephone Encounter (Signed)
 Copied from CRM [id number]. Topic: Clinical - Medication Question >> Jul 29, 2024 12:54 PM Harlene ORN wrote: Reason for CRM: requesting her metformin  takes 4 tablets a day (90 day supply) Her medication was sent to the wrong pharmacy. Please send to  [facility].  Please call back to discuss with patient.

## 2024-08-01 ENCOUNTER — Encounter: Payer: Self-pay | Admitting: Gastroenterology

## 2024-08-01 ENCOUNTER — Ambulatory Visit: Admitting: Gastroenterology

## 2024-08-01 VITALS — BP 130/70 | HR 81 | Ht 64.0 in | Wt 205.0 lb

## 2024-08-01 DIAGNOSIS — K219 Gastro-esophageal reflux disease without esophagitis: Secondary | ICD-10-CM

## 2024-08-01 DIAGNOSIS — K449 Diaphragmatic hernia without obstruction or gangrene: Secondary | ICD-10-CM | POA: Diagnosis not present

## 2024-08-01 DIAGNOSIS — R1013 Epigastric pain: Secondary | ICD-10-CM | POA: Diagnosis not present

## 2024-08-01 DIAGNOSIS — R1310 Dysphagia, unspecified: Secondary | ICD-10-CM

## 2024-08-01 DIAGNOSIS — Z860100 Personal history of colon polyps, unspecified: Secondary | ICD-10-CM

## 2024-08-01 DIAGNOSIS — R1319 Other dysphagia: Secondary | ICD-10-CM

## 2024-08-01 NOTE — Patient Instructions (Signed)
 You have been scheduled for an endoscopy. Please follow written instructions given to you at your visit today.  If you use inhalers (even only as needed), please bring them with you on the day of your procedure.  If you take any of the following medications, they will need to be adjusted prior to your procedure:   DO NOT TAKE 7 DAYS PRIOR TO TEST- Trulicity (dulaglutide) Ozempic, Wegovy (semaglutide) Mounjaro (tirzepatide) Bydureon Bcise (exanatide extended release)  DO NOT TAKE 1 DAY PRIOR TO YOUR TEST Rybelsus (semaglutide) Adlyxin (lixisenatide) Victoza (liraglutide) Byetta (exanatide) ___________________________________________________________________________  _______________________________________________________  If your blood pressure at your visit was 140/90 or greater, please contact your primary care physician to follow up on this.  _______________________________________________________  If you are age 64 or older, your body mass index should be between 23-30. Your Body mass index is 35.19 kg/m. If this is out of the aforementioned range listed, please consider follow up with your Primary Care Provider.  If you are age 64 or younger, your body mass index should be between 19-25. Your Body mass index is 35.19 kg/m. If this is out of the aformentioned range listed, please consider follow up with your Primary Care Provider.   ________________________________________________________  The [facility] providers would like to encourage you to use MYCHART to communicate with providers for non-urgent requests or questions.  Due to long hold times on the telephone, sending your provider a message by [facility] may be a faster and more efficient way to get a response.  Please allow 48 business hours for a response.  Please remember that this is for non-urgent requests.  _______________________________________________________  [facility] is using a team-based approach to  care.  Your team is made up of your doctor and two to three APPS. Our APPS (Nurse Practitioners and Physician Assistants) work with your physician to ensure care continuity for you. They are fully qualified to address your health concerns and develop a treatment plan. They communicate directly with your gastroenterologist to care for you. Seeing the Advanced Practice Practitioners on your physician's team can help you by facilitating care more promptly, often allowing for earlier appointments, access to diagnostic testing, procedures, and other specialty referrals.

## 2024-08-01 NOTE — Progress Notes (Signed)
 Brittany Santos Olincpddb [id number] [birth year]   Chief Complaint: GERD, difficulty swallowing  Referring Provider: No ref. provider found Primary GI MD: Dr. Wilhelmenia (previously seen at [facility])  HPI: Brittany Santos is a 63 y.o. female with past medical history of arthritis, diabetes, GERD, colon polyps, diverticulitis, kidney stones, sleep apnea, cholecystectomy who presents today to establish GI care.    She is on Protonix  40 mg twice daily, has previously been prescribed Carafate  1 g 4 times daily.   Patient states her heartburn is controlled with Protonix , though she has some occasional breakthrough symptoms particularly if she eats a known trigger food.  Protonix  is the only medication she has found helpful.  States she has been tried on other PPIs in the past but Protonix  worked the best.  Recently had a flareup of reflux symptoms.  States one of her providers had suggested trial of reducing Protonix  to once a day rather than twice a day.  After making this change she was having severe heartburn and reflux and had to be on Carafate  for several weeks.  Ultimately went back to Protonix  twice a day and now symptoms are feeling more controlled.  Has been unable to reduce dose.  She has noticed recent worsening of difficulty swallowing.  States this has happened in the past intermittently, but now is occurring more often.  Has sensation of something being in her chest, and feels she has to wait a while after eating before she is able to bend over without having reflux stomach contents.  Sometimes feels like food gets stuck in her chest.  She has to take smaller bites.  Denies having to regurgitate food.  Sometimes has pain with swallowing.  Reports having a left tonsillectomy 1 to 2 years ago.  She does report history of esophageal dilation years ago at [facility].  She occasionally has some epigastric pain which feels like a gnawing sensation when she is hungry, and is relieved  with eating.  She reports regular bowel movements and denies any constipation, diarrhea, blood in her stool, or melena.  Denies any unintentional weight loss, fever, chills, nausea, vomiting.  Denies chest pain or shortness of breath.  Denies history of MI/stroke.  Denies heart or lung problems.  Previous GI Procedures/Imaging   Colonoscopy 08/12/2020 - Friability with no bleeding at the ileocecal valve. Biopsied.  - Diverticulosis in the sigmoid colon.  - Non- bleeding internal hemorrhoids. - Recall 7 years  EGD 08/12/2020 - Small hiatal hernia.  - Gastritis. Biopsied.  - Normal examined duodenum.  Barium swallow 04/03/2016 Moderate GERD  Past Medical History:  Diagnosis Date   Allergy    Arthritis    BRCA negative 01/2020   MyRisk neg   Diabetes mellitus without complication (HCC)    Family history of breast cancer    GERD (gastroesophageal reflux disease)    History of colon polyps 2016, 2017   at [facility]   History of diverticulitis    History of kidney stones 04/15/15   Increased risk of breast cancer 01/2020   IBIS=26.6%   Sleep apnea     Past Surgical History:  Procedure Laterality Date   BILATERAL CARPAL TUNNEL RELEASE     BREAST EXCISIONAL BIOPSY Bilateral    benign   BREAST SURGERY     cervical neck fusion     CHOLECYSTECTOMY     COLONOSCOPY  2017   Dr. Gaylyn with polyp; repeat due 2021   COLONOSCOPY WITH PROPOFOL  N/A 08/12/2020  Procedure: COLONOSCOPY WITH PROPOFOL ;  Surgeon: Jinny Carmine, MD;  Location: [facility] ENDOSCOPY;  Service: Endoscopy;  Laterality: N/A;   ENDOVENOUS ABLATION SAPHENOUS VEIN W/ LASER Left 04/15/2020   endovenous laser ablation left greater saphenous vein by Medford Blade MD    ENDOVENOUS ABLATION SAPHENOUS VEIN W/ LASER Left 10/21/2020   endovenous laser ablation left small saphenous vein by Medford Blade MD    ESOPHAGOGASTRODUODENOSCOPY (EGD) WITH PROPOFOL  N/A 05/12/2016   Procedure: ESOPHAGOGASTRODUODENOSCOPY (EGD) WITH PROPOFOL ;   Surgeon: Gladis RAYMOND Mariner, MD;  Location: [facility] ENDOSCOPY;  Service: Endoscopy;  Laterality: N/A;   ESOPHAGOGASTRODUODENOSCOPY (EGD) WITH PROPOFOL  N/A 08/12/2020   Procedure: ESOPHAGOGASTRODUODENOSCOPY (EGD) WITH PROPOFOL ;  Surgeon: Jinny Carmine, MD;  Location: [facility] ENDOSCOPY;  Service: Endoscopy;  Laterality: N/A;   TONSILLECTOMY Left 09/04/2022   Procedure: PARTIAL TONSILLECTOMY;  Surgeon: Milissa Hamming, MD;  Location: [facility] ORS;  Service: ENT;  Laterality: Left;    Current Outpatient Medications  Medication Sig Dispense Refill   azelastine  (ASTELIN ) 0.1 % nasal spray PLACE 1 SPRAY INTO BOTH NOSTRILS TWICE DAILY 30 mL 2   BIOTIN PO Take 1 tablet by mouth daily.     Calcium-Magnesium-Vitamin D  (CALCIUM 500 PO) Take by mouth.     cetirizine  (ZYRTEC ) 10 MG tablet Take 1 tablet (10 mg total) by mouth daily. 30 tablet 1   empagliflozin  (JARDIANCE ) 25 MG TABS tablet Take 1 tablet (25 mg total) by mouth daily before breakfast. 90 tablet 3   gabapentin  (NEURONTIN ) 100 MG capsule Take 1 capsule (100 mg total) by mouth in the morning and 1 capsule at bedtime 180 capsule 3   glucose blood test strip Dispense based on patient preference and insurance preference, Use as once daily Dx: E11.9 100 each 12   glucose blood test strip 1 each by Other route daily.     Lancet Devices (ONE TOUCH DELICA LANCING DEV) MISC Use as directed Dx: E11.9 1 each 0   Lancets (ONETOUCH DELICA PLUS LANCET30G) MISC 1 each See admin instructions.     metFORMIN  (GLUCOPHAGE -XR) 500 MG 24 hr tablet Take 1 tablet (500 mg total) by mouth 2 (two) times daily with a meal. (Patient taking differently: Take 1,000 mg by mouth 2 (two) times daily with a meal.) 360 tablet 1   Multiple Vitamin (MULTIVITAMIN) capsule Take 1 capsule by mouth daily.     pantoprazole  (PROTONIX ) 40 MG tablet Take 1 tablet (40 mg total) by mouth 2 (two) times daily. 180 tablet 3   sucralfate  (CARAFATE ) 1 GM/10ML suspension Take 10 mLs (1 g total) by mouth 4  (four) times daily -  with meals and at bedtime. 420 mL 3   No current facility-administered medications for this visit.    Allergies as of 08/01/2024 - Review Complete 08/01/2024  Allergen Reaction Noted   Clarithromycin  03/14/2019   Hydrocodone Other (See Comments) 10/07/2018   Vicodin [hydrocodone-acetaminophen ] Itching and Rash 01/20/2015    Family History  Problem Relation Age of Onset   Breast cancer Mother        twice--30s and 15 (bilat breasts)   Breast cancer Sister 39   Cancer Sister 20       Blood cancer (unknown)   Hypertension Brother    Cancer Maternal Aunt        Lung   Kidney cancer Maternal Uncle 30   Prostate cancer Maternal Uncle 79       needed seed tx   Heart attack Paternal Uncle    Aneurysm Paternal Grandmother  Prostate cancer Cousin 68   Diabetes Neg Hx     Social History   Tobacco Use   Smoking status: Never   Smokeless tobacco: Never  Vaping Use   Vaping status: Never Used  Substance Use Topics   Alcohol use: No    Alcohol/week: 0.0 standard drinks of alcohol   Drug use: No     Review of Systems:    Constitutional: No unintentional weight loss, fever, chills, weakness Cardiovascular: No chest pain Respiratory: No SOB Gastrointestinal: See HPI and otherwise negative Hematologic: No bleeding    Physical Exam:  Vital signs: BP 130/70   Pulse 81   Ht 5' 4 (1.626 m)   Wt 205 lb (93 kg)   LMP  (LMP Unknown) Comment: Patient states she had her uterus scraped sometime in the past 10 years.   BMI 35.19 kg/m   Constitutional: Pleasant, overweight female in NAD, alert and cooperative Head:  Normocephalic and atraumatic.  Eyes: No scleral icterus.  Respiratory: Respirations even and unlabored. Lungs clear to auscultation bilaterally.  No wheezes, crackles, or rhonchi.  Cardiovascular:  Regular rate and rhythm. No murmurs. No peripheral edema. Gastrointestinal:  Soft, nondistended, minimal tenderness to palpation of epigastrium.  No rebound or guarding. Normal bowel sounds. No appreciable masses or hepatomegaly. Rectal:  Not performed.  Neurologic:  Alert and oriented x4;  grossly normal neurologically.  Skin:   Dry and intact without significant lesions or rashes. Psychiatric: Oriented to person, place and time. Demonstrates good judgement and reason without abnormal affect or behaviors.   RELEVANT LABS AND IMAGING: CBC    Component Value Date/Time   WBC 6.8 05/06/2024 0742   RBC 4.68 05/06/2024 0742   HGB 14.2 05/06/2024 0742   HCT 42.5 05/06/2024 0742   PLT 214.0 05/06/2024 0742   MCV 90.9 05/06/2024 0742   MCH 28.9 10/21/2015 1724   MCHC 33.4 05/06/2024 0742   RDW 14.6 05/06/2024 0742   LYMPHSABS 1.5 05/06/2024 0742   MONOABS 0.5 05/06/2024 0742   EOSABS 0.8 (H) 05/06/2024 0742   BASOSABS 0.0 05/06/2024 0742    CMP     Component Value Date/Time   NA 141 05/06/2024 0742   NA 142 10/09/2014 0000   K 3.8 05/06/2024 0742   CL 101 05/06/2024 0742   CO2 30 05/06/2024 0742   GLUCOSE 156 (H) 05/06/2024 0742   BUN 15 05/06/2024 0742   BUN 11 10/09/2014 0000   CREATININE 0.66 05/06/2024 0742   CALCIUM 9.5 05/06/2024 0742   PROT 7.2 05/06/2024 0742   ALBUMIN 4.2 05/06/2024 0742   AST 19 05/06/2024 0742   ALT 15 05/06/2024 0742   ALKPHOS 56 05/06/2024 0742   BILITOT 0.5 05/06/2024 0742   GFRNONAA >60 10/21/2015 1724   GFRAA >60 10/21/2015 1724     Assessment/Plan:   GERD Esophageal dysphagia Epigastric abdominal pain Hiatal hernia Patient reports longstanding history of GERD.  Last EGD 2021 with finding of a small hiatal hernia and gastritis.  She recently tried to reduce Protonix  to once daily and had significant GERD symptoms with heartburn and acid reflux that only improved after course of Carafate  and return to Protonix  twice daily.  She has breakthrough symptoms if she eats known trigger foods.  Has some occasional gnawing epigastric pain if she is hungry which is relieved with  eating. Protonix  has worked the best to control her symptoms in the past. Recently has been having some trouble swallowing, feeling like she has to take smaller bites.  Has sensation of food feeling stuck in her chest but denies having to regurgitate food.  Reports that she has had [place] esophagus dilated in the [place].  -Schedule EGD with possible dilation. I thoroughly discussed the procedure with the patient to include nature of the procedure, alternatives, benefits, and risks (including but not limited to bleeding, infection, perforation, anesthesia/cardiac/pulmonary complications). Patient verbalized understanding and gave verbal consent to proceed with procedure.  - Continue Protonix  40 mg twice daily - Lifestyle modifications for GERD  History of colon polyps Last colonoscopy 2021 with findings of diverticulosis and nonbleeding internal hemorrhoids, with recommended recall in 7 years.  She will be due for repeat in 2028.   Camie Furbish, PA-C [facility] 08/01/2024, 12:38 PM  Patient Care Team: Knowles-Jonas, Lynde, MD as Referring Physician (Gynecology)

## 2024-08-25 ENCOUNTER — Encounter: Admitting: Family Medicine

## 2024-08-25 NOTE — Progress Notes (Unsigned)
 [facility] History and Physical   Primary Care Physician:  Patient, No Pcp Per   Reason for Procedure:    Encounter Diagnoses  Name Primary?   Esophageal dysphagia Yes   Gastroesophageal reflux disease, unspecified whether esophagitis present      Plan:    EGD     HPI: Brittany Santos is a 63 y.o. female seen in the office by Camie Furbish PA-C August 01, 2024, with complaints of GERD and dysphagia.  EGD was scheduled.  Please refer to that note for further details.   Past Medical History:  Diagnosis Date   Allergy    Arthritis    BRCA negative 01/2020   MyRisk neg   Diabetes mellitus without complication (HCC)    Family history of breast cancer    GERD (gastroesophageal reflux disease)    History of colon polyps 2016, 2017   at [facility]   History of diverticulitis    History of kidney stones 04/15/2015   Increased risk of breast cancer 01/2020   IBIS=26.6%   Sleep apnea     Past Surgical History:  Procedure Laterality Date   BILATERAL CARPAL TUNNEL RELEASE     BREAST EXCISIONAL BIOPSY Bilateral    benign   BREAST SURGERY     cervical neck fusion     CHOLECYSTECTOMY     COLONOSCOPY  2017   Dr. Gaylyn with polyp; repeat due 2021   COLONOSCOPY WITH PROPOFOL  N/A 08/12/2020   Procedure: COLONOSCOPY WITH PROPOFOL ;  Surgeon: Jinny Carmine, MD;  Location: [facility] ENDOSCOPY;  Service: Endoscopy;  Laterality: N/A;   ENDOVENOUS ABLATION SAPHENOUS VEIN W/ LASER Left 04/15/2020   endovenous laser ablation left greater saphenous vein by Medford Blade MD    ENDOVENOUS ABLATION SAPHENOUS VEIN W/ LASER Left 10/21/2020   endovenous laser ablation left small saphenous vein by Medford Blade MD    ESOPHAGOGASTRODUODENOSCOPY (EGD) WITH PROPOFOL  N/A 05/12/2016   Procedure: ESOPHAGOGASTRODUODENOSCOPY (EGD) WITH PROPOFOL ;  Surgeon: Gladis RAYMOND Gaylyn, MD;  Location: [facility] ENDOSCOPY;  Service: Endoscopy;  Laterality: N/A;   ESOPHAGOGASTRODUODENOSCOPY (EGD) WITH PROPOFOL  N/A  08/12/2020   Procedure: ESOPHAGOGASTRODUODENOSCOPY (EGD) WITH PROPOFOL ;  Surgeon: Jinny Carmine, MD;  Location: [facility] ENDOSCOPY;  Service: Endoscopy;  Laterality: N/A;   TONSILLECTOMY Left 09/04/2022   Procedure: PARTIAL TONSILLECTOMY;  Surgeon: Milissa Hamming, MD;  Location: [facility] ORS;  Service: ENT;  Laterality: Left;     Current Outpatient Medications  Medication Sig Dispense Refill   azelastine  (ASTELIN ) 0.1 % nasal spray PLACE 1 SPRAY INTO BOTH NOSTRILS TWICE DAILY 30 mL 2   cetirizine  (ZYRTEC ) 10 MG tablet Take 1 tablet (10 mg total) by mouth daily. 30 tablet 1   empagliflozin  (JARDIANCE ) 25 MG TABS tablet Take 1 tablet (25 mg total) by mouth daily before breakfast. 90 tablet 3   gabapentin  (NEURONTIN ) 100 MG capsule Take 1 capsule (100 mg total) by mouth in the morning and 1 capsule at bedtime 180 capsule 3   glucose blood test strip Dispense based on patient preference and insurance preference, Use as once daily Dx: E11.9 100 each 12   glucose blood test strip 1 each by Other route daily.     Lancet Devices (ONE TOUCH DELICA LANCING DEV) MISC Use as directed Dx: E11.9 1 each 0   Lancets (ONETOUCH DELICA PLUS LANCET30G) MISC 1 each See admin instructions.     metFORMIN  (GLUCOPHAGE -XR) 500 MG 24 hr tablet Take 1 tablet (500 mg total) by mouth 2 (two) times daily with a meal. (Patient  taking differently: Take 1,000 mg by mouth 2 (two) times daily with a meal.) 360 tablet 1   Multiple Vitamin (MULTIVITAMIN) capsule Take 1 capsule by mouth daily.     pantoprazole  (PROTONIX ) 40 MG tablet Take 1 tablet (40 mg total) by mouth 2 (two) times daily. 180 tablet 3   BIOTIN PO Take 1 tablet by mouth daily.     Calcium-Magnesium-Vitamin D  (CALCIUM 500 PO) Take by mouth.     sucralfate  (CARAFATE ) 1 GM/10ML suspension Take 10 mLs (1 g total) by mouth 4 (four) times daily -  with meals and at bedtime. 420 mL 3   Current Facility-Administered Medications  Medication Dose Route Frequency Provider Last  Rate Last Admin   0.9 %  sodium chloride  infusion  500 mL Intravenous Once Avram Lupita BRAVO, MD        Allergies as of 08/26/2024 - Review Complete 08/26/2024  Allergen Reaction Noted   Clarithromycin Nausea Only 03/14/2019   Hydrocodone Itching 10/07/2018   Vicodin [hydrocodone-acetaminophen ] Itching and Rash 01/20/2015    Family History  Problem Relation Age of Onset   Breast cancer Mother        twice--30s and 61 (bilat breasts)   Breast cancer Sister 79   Cancer Sister 20       Blood cancer (unknown)   Hypertension Brother    Cancer Maternal Aunt        Lung   Kidney cancer Maternal Uncle 30   Prostate cancer Maternal Uncle 79       needed seed tx   Heart attack Paternal Uncle    Aneurysm Paternal Grandmother    Prostate cancer Cousin 54   Diabetes Neg Hx     Social History   Socioeconomic History   Marital status: Single    Spouse name: Not on file   Number of children: Not on file   Years of education: Not on file   Highest education level: Not on file  Occupational History   Not on file  Tobacco Use   Smoking status: Never   Smokeless tobacco: Never  Vaping Use   Vaping status: Never Used  Substance and Sexual Activity   Alcohol use: No    Alcohol/week: 0.0 standard drinks of alcohol   Drug use: No   Sexual activity: Not Currently    Birth control/protection: Post-menopausal  Other Topics Concern   Not on file  Social History Narrative   Works- [employer]    Mother lives with pt   No children    1 dog lives inside    Right handed    Caffeine- 1 piece chocolate occasionally    Associate degree   Enjoys- gardening and going to concerts    Social Drivers of Corporate investment banker Strain: Not on file  Food Insecurity: Not on file  Transportation Needs: Not on file  Physical Activity: Not on file  Stress: Not on file  Social Connections: Not on file  Intimate Partner Violence: Not on file    Review of Systems:  All other  review of systems negative except as mentioned in the HPI.  Physical Exam: Vital signs BP 130/72   Pulse 70   Temp (!) 97.4 F (36.3 C)   Ht 5' 4 (1.626 m)   Wt 205 lb (93 kg)   LMP  (LMP Unknown) Comment: Patient states she had her uterus scraped sometime in the past 10 years.   SpO2 97%   BMI 35.19 kg/m  General:   Alert,  Well-developed, well-nourished, pleasant and cooperative in NAD Lungs:  Clear throughout to auscultation.   Heart:  Regular rate and rhythm; no murmurs, clicks, rubs,  or gallops. Abdomen:  Soft, nontender and nondistended. Normal bowel sounds.   Neuro/Psych:  Alert and cooperative. Normal mood and affect. A and O x 3   @Susie Ehresman  CHARLENA Commander, MD, [facility] [phone] (pager) 08/26/2024 10:18 AM@

## 2024-08-26 ENCOUNTER — Ambulatory Visit: Admitting: Internal Medicine

## 2024-08-26 ENCOUNTER — Encounter: Payer: Self-pay | Admitting: Internal Medicine

## 2024-08-26 VITALS — BP 140/58 | HR 70 | Temp 97.4°F | Resp 19 | Ht 64.0 in | Wt 205.0 lb

## 2024-08-26 DIAGNOSIS — K317 Polyp of stomach and duodenum: Secondary | ICD-10-CM | POA: Diagnosis not present

## 2024-08-26 DIAGNOSIS — K3189 Other diseases of stomach and duodenum: Secondary | ICD-10-CM | POA: Diagnosis not present

## 2024-08-26 DIAGNOSIS — R1319 Other dysphagia: Secondary | ICD-10-CM

## 2024-08-26 DIAGNOSIS — R1310 Dysphagia, unspecified: Secondary | ICD-10-CM | POA: Diagnosis not present

## 2024-08-26 DIAGNOSIS — K219 Gastro-esophageal reflux disease without esophagitis: Secondary | ICD-10-CM | POA: Diagnosis not present

## 2024-08-26 DIAGNOSIS — Q399 Congenital malformation of esophagus, unspecified: Secondary | ICD-10-CM | POA: Diagnosis not present

## 2024-08-26 MED ORDER — SODIUM CHLORIDE 0.9 % IV SOLN: 500.0000 mL | Freq: Once | INTRAVENOUS | Status: DC

## 2024-08-26 NOTE — Progress Notes (Signed)
 Pt's states no medical or surgical changes since previsit or office visit.

## 2024-08-26 NOTE — Op Note (Signed)
 [facility] Patient Name: Brittany Santos Procedure Date: 08/26/2024 10:14 AM MRN: [id number] Endoscopist: Lupita FORBES Commander , MD, [id number] Age: 63 Referring MD:  Date of Birth: Jan 28, 1961 Gender: Female Account #: [id number] Procedure:                Upper GI endoscopy Indications:              Dysphagia, Gastro-esophageal reflux disease Medicines:                Monitored Anesthesia Care Procedure:                Pre-Anesthesia Assessment:                           - Prior to the procedure, a History and Physical                            was performed, and patient medications and                            allergies were reviewed. The patient's tolerance of                            previous anesthesia was also reviewed. The risks                            and benefits of the procedure and the sedation                            options and risks were discussed with the patient.                            All questions were answered, and informed consent                            was obtained. Prior Anticoagulants: The patient has                            taken no anticoagulant or antiplatelet agents. ASA                            Grade Assessment: II - A patient with mild systemic                            disease. After reviewing the risks and benefits,                            the patient was deemed in satisfactory condition to                            undergo the procedure.                           After obtaining informed consent, the endoscope was  passed under direct vision. Throughout the                            procedure, the patient's blood pressure, pulse, and                            oxygen saturations were monitored continuously. The                            Olympus Scope [id number] was introduced through the                            mouth, and advanced to the second part of duodenum.                            The  upper GI endoscopy was accomplished without                            difficulty. The patient tolerated the procedure                            well. Scope In: Scope Out: Findings:                 The examined esophagus was mildly tortuous. The                            scope was withdrawn. Dilation was performed with a                            Maloney dilator with mild resistance at 54 Fr. The                            dilation site was examined following endoscope                            reinsertion and showed no change. Estimated blood                            loss: none.                           A single 10 mm semi-sessile polyp with stigmata of                            recent bleeding was found on the anterior wall of                            the gastric body. The polyp was removed with a cold                            biopsy forceps by cutting at the base with the  forceps. Resection and retrieval were complete.                            Verification of patient identification for the                            specimen was done. Estimated blood loss was minimal.                           Patchy mildly erythematous mucosa without bleeding                            was found in the gastric antrum.                           The exam was otherwise without abnormality.                           The cardia and gastric fundus were otherwise normal                            on retroflexion. Complications:            No immediate complications. Estimated Blood Loss:     Estimated blood loss was minimal. Impression:               - Tortuous esophagus. 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 Dilated. 54 Fr Maloney.                           - A single 10 mmgastric polyp. Friable and                            consistent with inflammatory polyp. Resected and                            retrieved.                           -  Erythematous mucosa in the antrum. 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 Recommendation:           - Patient has a contact number available for                            emergencies. The signs and symptoms of potential  delayed complications were discussed with the                            patient. Return to normal activities tomorrow.                            Written discharge instructions were provided to the                            patient.                           - Clear liquids x 1 hour then soft foods rest of                            day. Start prior diet tomorrow.                           - Continue present medications.                           - Await pathology results. Lupita FORBES Commander, MD 08/26/2024 10:48:43 AM This report has been signed electronically.

## 2024-08-26 NOTE — Progress Notes (Signed)
 Called to room to assist during endoscopic procedure.  Patient ID and intended procedure confirmed with present staff. Received instructions for my participation in the procedure from the performing physician.

## 2024-08-26 NOTE — Progress Notes (Signed)
 Transferred to PACU via stretcher, arousing, VSS.

## 2024-08-26 NOTE — Patient Instructions (Addendum)
 Please read handouts provided. Continue present medications. Await pathology results. Dilation Diet.    YOU HAD AN ENDOSCOPIC PROCEDURE TODAY AT THE [facility]:   Refer to the procedure report that was given to you for any specific questions about what was found during the examination.  If the procedure report does not answer your questions, please call your gastroenterologist to clarify.  If you requested that your care partner not be given the details of your procedure findings, then the procedure report has been included in a sealed envelope for you to review at your convenience later.  YOU SHOULD EXPECT: Some feelings of bloating in the abdomen. Passage of more gas than usual.  Walking can help get rid of the air that was put into your GI tract during the procedure and reduce the bloating. If you had a lower endoscopy (such as a colonoscopy or flexible sigmoidoscopy) you may notice spotting of blood in your stool or on the toilet paper. If you underwent a bowel prep for your procedure, you may not have a normal bowel movement for a few days.  Please Note:  You might notice some irritation and congestion in your nose or some drainage.  This is from the oxygen used during your procedure.  There is no need for concern and it should clear up in a day or so.  SYMPTOMS TO REPORT IMMEDIATELY:  Following upper endoscopy (EGD)  Vomiting of blood or coffee ground material  New chest pain or pain under the shoulder blades  Painful or persistently difficult swallowing  New shortness of breath  Fever of 100F or higher  Black, tarry-looking stools  For urgent or emergent issues, a gastroenterologist can be reached at any hour by calling [phone]. Do not use MyChart messaging for urgent concerns.    DIET:  Drink plenty of fluids but you should avoid alcoholic beverages for 24 hours.  ACTIVITY:  You should plan to take it easy for the rest of today and you should NOT DRIVE or  use heavy machinery until tomorrow (because of the sedation medicines used during the test).    FOLLOW UP: Our staff will call the number listed on your records the next business day following your procedure.  We will call around 7:15- 8:00 am to check on you and address any questions or concerns that you may have regarding the information given to you following your procedure. If we do not reach you, we will leave a message.     If any biopsies were taken you will be contacted by phone or by letter within the next 1-3 weeks.  Please call us  at [phone] if you have not heard about the biopsies in 3 weeks.    SIGNATURES/CONFIDENTIALITY: You and/or your care partner have signed paperwork which will be entered into your electronic medical record.  These signatures attest to the fact that that the information above on your After Visit Summary has been reviewed and is understood.  Full responsibility of the confidentiality of this discharge information lies with you and/or your care-partner.I dilated the esophagus to help you swallow better.  I also removed a stomach polyp.  Please continue current treatment.  I appreciate the opportunity to care for you. Lupita CHARLENA Commander, MD, [facility]

## 2024-08-27 ENCOUNTER — Telehealth: Payer: Self-pay

## 2024-08-27 NOTE — Telephone Encounter (Signed)
  Follow up Call-     08/26/2024    9:11 AM  Call back number  Post procedure Call Back phone  # [phone]  Permission to leave phone message Yes     Patient questions:  Do you have a fever, pain , or abdominal swelling? No. Pain Score  0 *  Have you tolerated food without any problems? Yes.    Have you been able to return to your normal activities? Yes.    Do you have any questions about your discharge instructions: Diet   No. Medications  No. Follow up visit  No.  Do you have questions or concerns about your Care? No.  Actions: * If pain score is 4 or above: No action needed, pain <4.

## 2024-08-28 LAB — SURGICAL PATHOLOGY

## 2024-08-29 ENCOUNTER — Ambulatory Visit: Payer: Self-pay | Admitting: Internal Medicine

## 2024-09-25 ENCOUNTER — Encounter

## 2024-09-25 ENCOUNTER — Ambulatory Visit: Admitting: Family Medicine

## 2024-10-03 ENCOUNTER — Ambulatory Visit: Admitting: Family Medicine

## 2024-10-14 DIAGNOSIS — L814 Other melanin hyperpigmentation: Secondary | ICD-10-CM | POA: Diagnosis not present

## 2024-10-14 DIAGNOSIS — L821 Other seborrheic keratosis: Secondary | ICD-10-CM | POA: Diagnosis not present

## 2024-10-14 DIAGNOSIS — Q828 Other specified congenital malformations of skin: Secondary | ICD-10-CM | POA: Diagnosis not present

## 2024-10-14 DIAGNOSIS — D229 Melanocytic nevi, unspecified: Secondary | ICD-10-CM | POA: Diagnosis not present

## 2024-10-17 ENCOUNTER — Ambulatory Visit

## 2024-10-17 VITALS — BP 140/80 | HR 77 | Ht 64.0 in | Wt 205.6 lb

## 2024-10-17 DIAGNOSIS — G4733 Obstructive sleep apnea (adult) (pediatric): Secondary | ICD-10-CM

## 2024-10-17 DIAGNOSIS — M5416 Radiculopathy, lumbar region: Secondary | ICD-10-CM

## 2024-10-17 DIAGNOSIS — Z981 Arthrodesis status: Secondary | ICD-10-CM | POA: Diagnosis not present

## 2024-10-17 DIAGNOSIS — E538 Deficiency of other specified B group vitamins: Secondary | ICD-10-CM | POA: Diagnosis not present

## 2024-10-17 DIAGNOSIS — Z7984 Long term (current) use of oral hypoglycemic drugs: Secondary | ICD-10-CM

## 2024-10-17 DIAGNOSIS — E118 Type 2 diabetes mellitus with unspecified complications: Secondary | ICD-10-CM

## 2024-10-17 DIAGNOSIS — G8929 Other chronic pain: Secondary | ICD-10-CM

## 2024-10-17 DIAGNOSIS — M5441 Lumbago with sciatica, right side: Secondary | ICD-10-CM

## 2024-10-17 DIAGNOSIS — I872 Venous insufficiency (chronic) (peripheral): Secondary | ICD-10-CM

## 2024-10-17 MED ORDER — ROSUVASTATIN CALCIUM 10 MG PO TABS: 10.0000 mg | ORAL_TABLET | Freq: Every day | ORAL | 3 refills | Status: DC

## 2024-10-17 NOTE — Progress Notes (Signed)
 New Patient Visit   Physician: Raheim Beutler A Ronne Stefanski, MD  Patient: Brittany Santos   DOB: [birth year]   63 y.o. Female  MRN: [id number] Visit Date: 10/17/2024   Chief Complaint  Patient presents with   Establish Care   Subjective  Brittany Santos is a 63 y.o. female who presents today as a new patient to establish care.   HPI  Discussed the use of AI scribe software for clinical note transcription with the patient, who gave verbal consent to proceed.  History of Present Illness   Brittany Santos is a 63 year old female who presents for transfer of care and management of her chronic conditions.  Diabetes mellitus - Diabetes managed with metformin  1000 mg twice daily and Jardiance  25 mg daily - Last hemoglobin A1c was 6.4% in May 2025 - Difficulty adhering to diabetic diet due to caregiving responsibilities - Reduced bread and potato intake; uses online resources for dietary guidance - Exercise less frequent due to caregiving duties, but has a home exercise setup - No visual disturbances; recent eye exam without abnormalities  Gastroesophageal reflux disease (gerd) - GERD managed with pantoprazole , which is most effective after trying various medications - Occasional use of sucralfate  as needed - Upper endoscopy unremarkable except for removal of a large polyp - Avoids dietary triggers such as tomatoes and garlic - Elevates head of bed to reduce symptoms  Obesity and weight management - History of obesity - Recent weight loss attributed to reduced stress and healthier lifestyle since retirement - Actively working on weight loss  Obstructive sleep apnea - Consistent use of CPAP machine for sleep apnea  Chronic back and hip pain - Chronic back and hip pain attributed to sciatica and prior injuries, including vertebral fractures and tailbone injury from a fall  - She has sciatica but also pain radiating down both hips, particularly with standing - Gabapentin   100 mg twice daily for pain management - Performs stretches learned from a gym trainer with a physical therapy background  Chronic venous insufficiency and lower extremity edema - History of venous insufficiency with prior vein cauterization - Lower extremity swelling managed with compression stockings and leg elevation  Allergic rhinitis and sinus congestion - History of sinus issues and allergies - Takes Claritin  for symptom control  Vitamin d  and b12 deficiency - History of low vitamin D  and B12 levels, managed with supplements - Recent elevated B12 levels, adjusted supplement intake accordingly  Cancer risk and screening - Family history of breast cancer (mother and sister), lung, bladder, and prostate cancer - Regular mammograms - Genetic testing negative for breast cancer genes  Immunization status - Received influenza and pneumococcal vaccines - Undecided about COVID-19 vaccination - No history of chickenpox; considering titer testing for shingles vaccination         ASSESSMENT & PLAN  Encounter Diagnoses  Name Primary?   Type 2 diabetes with complication (HCC) Yes   OSA (obstructive sleep apnea)    History of fusion of cervical spine    Vitamin B 12 deficiency    Lumbar radiculopathy    Chronic right-sided low back pain with right-sided sciatica     Orders Placed This Encounter  Procedures   CBC with Differential/Platelet   Comprehensive metabolic panel with GFR   Hemoglobin A1c   Lipid panel   Urinalysis, Routine w reflex microscopic   B12 and Folate Panel   Microalbumin / creatinine urine ratio   Varicella zoster antibody, IgG  Assessment and Plan    Type 2 Diabetes Mellitus Type 2 Diabetes Mellitus managed with metformin  and Jardiance . Hemoglobin A1c at 6.4% indicates good control. Discussed potential Jardiance  discontinuation if A1c remains stable. Emphasized diet and exercise. Discussed cardiovascular risk and statin initiation. - Order hemoglobin  A1c test. - Encourage adherence to diabetic diet and exercise. - Consider discontinuing Jardiance  if hemoglobin A1c remains around 6.5%.  We can trial metformin  and diet control.  - Discuss statin initiation due to cardiovascular risk - rosuvastatin 10 mg daily  Chronic Back Pain with Sciatica Chronic back pain with sciatica managed with gabapentin .  Conditioning exercises given She has 2019 imaging result showing spinal stenosis and multiple disc herniations as well as foraminal stenosis - Continue gabapentin  100 mg twice daily. - Encourage regular stretching exercises. - We may want to consider further imaging - will discuss next visit  Venous Insufficiency Venous insufficiency with venous stasis dermatitis. Advised on leg elevation, compression stockings, and skin care.  - Recommend regular exercise.  Gastroesophageal Reflux Disease (GERD) GERD managed with pantoprazole . Advised to avoid dietary triggers and elevate head of bed. - Continue pantoprazole  as needed.   Obesity Obesity with some weight loss success. Emphasized weight loss for overall health. - Encourage continued weight loss efforts.  General Health Maintenance Up to date with eye exams and mammograms. Takes supplements. Discussed bone density monitoring - last BD normal. Discussed COVID-19 vaccine. Shingles immunity uncertain. - Order shingles titer to assess immunity. - Continue calcium, magnesium, and vitamin D  supplementation. - Encourage regular eye exams and mammograms. - Discuss COVID-19 vaccination.  Follow-up Follow-up planned to reassess diabetes management and other health concerns. - Schedule follow-up appointment in 3 weeks. - Perform fasting labs before next appointment.      Objective  BP (!) 140/80   Pulse 77   Ht 5' 4 (1.626 m)   Wt 205 lb 9.6 oz (93.3 kg)   LMP  (LMP Unknown) Comment: Patient states she had her uterus scraped sometime in the past 10 years.   SpO2 96%   BMI 35.29 kg/m       Review of Systems  Constitutional:  Negative for chills, fever and weight loss.  Eyes:  Negative for blurred vision. h Respiratory:  Negative for cough and shortness of breath.   Cardiovascular:  Negative for chest pain and palpitations.  Skin:  Negative for rash.  Psychiatric/Behavioral:  Negative for depression. The patient is not nervous/anxious.      Physical Exam Physical Exam Vitals reviewed.  Constitutional:      Appearance: Normal appearance. Well-developed with normal weight.  HENT:     Head: Normocephalic and atraumatic.  Normal mucous membranes, no oral lesions Eyes:     Pupils: Pupils are equal, round, and reactive to light.  Neck:     Thyroid : No thyroid  mass or thyromegaly.  Cardiovascular:     Rate and Rhythm: Normal rate and regular rhythm. Normal heart sounds. Normal peripheral pulses Pulmonary:     Normal breath sounds with normal effort - occasional wheeze Abdominal:   Abdomen is soft, without tenderness or noted hepatosplenomegaly Musculoskeletal:        General: No swelling or edema  Lymphadenopathy:     Cervical: No cervical adenopathy.  Skin:    General: Skin is warm and dry without noticeable rash. Neurological:     General: No focal deficit present.  Psychiatric:        Mood and Affect: Mood, behavior and cognition normal   Past  Medical History:  Diagnosis Date   Allergy    Arthritis    BRCA negative 01/2020   MyRisk neg   Diabetes mellitus without complication (HCC)    Family history of breast cancer    GERD (gastroesophageal reflux disease)    History of colon polyps 2016, 2017   at [facility]   History of diverticulitis    History of kidney stones 04/15/2015   Increased risk of breast cancer 01/2020   IBIS=26.6%   Sebaceous gland hyperplasia of vulva 03/06/2022   Sleep apnea    Past Surgical History:  Procedure Laterality Date   BILATERAL CARPAL TUNNEL RELEASE     BREAST EXCISIONAL BIOPSY Bilateral    benign   BREAST SURGERY      cervical neck fusion     CHOLECYSTECTOMY     COLONOSCOPY  2017   Dr. Gaylyn with polyp; repeat due 2021   COLONOSCOPY WITH PROPOFOL  N/A 08/12/2020   Procedure: COLONOSCOPY WITH PROPOFOL ;  Surgeon: Jinny Carmine, MD;  Location: [facility] ENDOSCOPY;  Service: Endoscopy;  Laterality: N/A;   ENDOVENOUS ABLATION SAPHENOUS VEIN W/ LASER Left 04/15/2020   endovenous laser ablation left greater saphenous vein by Medford Blade MD    ENDOVENOUS ABLATION SAPHENOUS VEIN W/ LASER Left 10/21/2020   endovenous laser ablation left small saphenous vein by Medford Blade MD    ESOPHAGOGASTRODUODENOSCOPY (EGD) WITH PROPOFOL  N/A 05/12/2016   Procedure: ESOPHAGOGASTRODUODENOSCOPY (EGD) WITH PROPOFOL ;  Surgeon: Gladis RAYMOND Gaylyn, MD;  Location: [facility] ENDOSCOPY;  Service: Endoscopy;  Laterality: N/A;   ESOPHAGOGASTRODUODENOSCOPY (EGD) WITH PROPOFOL  N/A 08/12/2020   Procedure: ESOPHAGOGASTRODUODENOSCOPY (EGD) WITH PROPOFOL ;  Surgeon: Jinny Carmine, MD;  Location: [facility] ENDOSCOPY;  Service: Endoscopy;  Laterality: N/A;   TONSILLECTOMY Left 09/04/2022   Procedure: PARTIAL TONSILLECTOMY;  Surgeon: Milissa Hamming, MD;  Location: [facility] ORS;  Service: ENT;  Laterality: Left;   Family Status  Relation Name Status   Mother  Alive   Father Unknown Other   Sister  Alive   Brother  Alive   Mat Aunt  Alive   Mat Uncle  Alive   [employer]  (Not Specified)   Bruna Brigham  Alive   PGM  Deceased   Cousin Mat Alive   Neg Hx  (Not Specified)  No partnership data on file   Family History  Problem Relation Age of Onset   Breast cancer Mother        twice--30s and 54 (bilat breasts)   Breast cancer Sister 25   Cancer Sister 20       Blood cancer (unknown)   Hypertension Brother    Cancer Maternal Aunt        Lung   Kidney cancer Maternal Uncle 30   Prostate cancer Maternal Uncle 79       needed seed tx   Heart attack Paternal Uncle    Aneurysm Paternal Grandmother    Prostate cancer Cousin 32   Diabetes Neg Hx    Social  History   Socioeconomic History   Marital status: Single    Spouse name: Not on file   Number of children: Not on file   Years of education: Not on file   Highest education level: Associate degree: academic program  Occupational History   Not on file  Tobacco Use   Smoking status: Never   Smokeless tobacco: Never  Vaping Use   Vaping status: Never Used  Substance and Sexual Activity   Alcohol use: No    Alcohol/week: 0.0 standard drinks of  alcohol   Drug use: No   Sexual activity: Not Currently    Birth control/protection: Post-menopausal  Other Topics Concern   Not on file  Social History Narrative   Works- [employer]    Mother lives with pt   No children    1 dog lives inside    Right handed    Caffeine- 1 piece chocolate occasionally    Associate degree   Enjoys- gardening and going to concerts    Social Drivers of Health   Financial Resource Strain: Medium Risk (09/30/2024)   Overall Financial Resource Strain (CARDIA)    Difficulty of Paying Living Expenses: Somewhat hard  Food Insecurity: Food Insecurity Present (09/30/2024)   Hunger Vital Sign    Worried About Running Out of Food in the Last Year: Never true    Ran Out of Food in the Last Year: Sometimes true  Transportation Needs: No Transportation Needs (09/30/2024)   PRAPARE - Administrator, Civil Service (Medical): No    Lack of Transportation (Non-Medical): No  Physical Activity: Insufficiently Active (09/30/2024)   Exercise Vital Sign    Days of Exercise per Week: 1 day    Minutes of Exercise per Session: 30 min  Stress: Stress Concern Present (09/30/2024)   [employer] of Occupational Health - Occupational Stress Questionnaire    Feeling of Stress: To some extent  Social Connections: Moderately Integrated (09/30/2024)   Social Connection and Isolation Panel    Frequency of Communication with Friends and Family: More than three times a week    Frequency of Social  Gatherings with Friends and Family: Twice a week    Attends Religious Services: 1 to 4 times per year    Active Member of [employer] or Organizations: Yes    Attends Engineer, Structural: More than 4 times per year    Marital Status: Never married   Outpatient Medications Prior to Visit  Medication Sig   azelastine  (ASTELIN ) 0.1 % nasal spray PLACE 1 SPRAY INTO BOTH NOSTRILS TWICE DAILY   BIOTIN PO Take 1 tablet by mouth daily.   Calcium-Magnesium-Vitamin D  (CALCIUM 500 PO) Take by mouth.   cetirizine  (ZYRTEC ) 10 MG tablet Take 1 tablet (10 mg total) by mouth daily.   empagliflozin  (JARDIANCE ) 25 MG TABS tablet Take 1 tablet (25 mg total) by mouth daily before breakfast.   gabapentin  (NEURONTIN ) 100 MG capsule Take 1 capsule (100 mg total) by mouth in the morning and 1 capsule at bedtime   glucose blood test strip 1 each by Other route daily.   Lancets (ONETOUCH DELICA PLUS LANCET30G) MISC 1 each See admin instructions.   metFORMIN  (GLUCOPHAGE -XR) 500 MG 24 hr tablet Take 1 tablet (500 mg total) by mouth 2 (two) times daily with a meal.   Multiple Vitamin (MULTIVITAMIN) capsule Take 1 capsule by mouth daily.   pantoprazole  (PROTONIX ) 40 MG tablet Take 1 tablet (40 mg total) by mouth 2 (two) times daily.   sucralfate  (CARAFATE ) 1 GM/10ML suspension Take 10 mLs (1 g total) by mouth 4 (four) times daily -  with meals and at bedtime.   Lancet Devices (ONE TOUCH DELICA LANCING DEV) MISC Use as directed Dx: E11.9   [DISCONTINUED] glucose blood test strip Dispense based on patient preference and insurance preference, Use as once daily Dx: E11.9   No facility-administered medications prior to visit.   Allergies  Allergen Reactions   Clarithromycin Nausea Only    Bad taste in mouth and made her  feel nauseated    Hydrocodone Itching   Vicodin [Hydrocodone-Acetaminophen ] Itching and Rash    Immunization History  Administered Date(s) Administered   Fluzone Influenza virus vaccine,trivalent  (IIV3), split virus 09/30/2017   Influenza, Seasonal, Injecte, Preservative Fre 11/26/2023   Influenza,inj,Quad PF,6+ Mos 08/27/2019, 10/19/2022   Influenza,inj,quad, With Preservative 10/01/2017   Influenza-Unspecified 09/27/2015, 09/30/2016, 10/16/2018, 08/15/2019, 09/23/2020, 10/26/2021   Moderna Covid-19 Vaccine Bivalent Booster 23yrs & up 09/28/2021, 10/14/2021   Moderna Sars-Covid-2 Vaccination 02/18/2020, 03/17/2020, 12/07/2020   Pneumococcal Conjugate-13 10/09/2016   Pneumococcal Polysaccharide-23 12/21/2018   Respiratory Syncytial Virus Vaccine,Recomb Aduvanted(Arexvy) 12/05/2022   Tdap 12/21/2015    Health Maintenance  Topic Date Due   Zoster Vaccines- Shingrix (1 of 2) Never done   Pneumococcal Vaccine: 50+ Years (3 of 3 - PCV20 or PCV21) 12/22/2023   Influenza Vaccine  07/18/2024   COVID-19 Vaccine (5 - 2025-26 season) 08/18/2024   HEMOGLOBIN A1C  11/06/2024   FOOT EXAM  11/25/2024   Diabetic kidney evaluation - eGFR measurement  05/06/2025   Diabetic kidney evaluation - Urine ACR  05/06/2025   OPHTHALMOLOGY EXAM  06/02/2025   Colonoscopy  08/12/2025   DTaP/Tdap/Td (2 - Td or Tdap) 12/20/2025   Mammogram  05/15/2026   Cervical Cancer Screening (HPV/Pap Cotest)  05/02/2028   Hepatitis C Screening  Completed   HIV Screening  Completed   Hepatitis B Vaccines 19-59 Average Risk  Aged Out   HPV VACCINES  Aged Out   Meningococcal B Vaccine  Aged Out    Patient Care Team: Clyde Zarrella A, MD as PCP - General (Family Medicine) Knowles-Jonas, Lynde, MD as Referring Physician (Gynecology)  Depression Screen    05/06/2024    7:55 AM 11/26/2023   10:34 AM 08/29/2023   12:52 PM 07/23/2023    3:21 PM  PHQ 2/9 Scores  PHQ - 2 Score 0 0 0 0  PHQ- 9 Score 0 0 0 4     Mahamud Metts DELENA Juneau, MD  [facility] [phone] (phone) [phone] (fax)  [facility]

## 2024-10-21 ENCOUNTER — Ambulatory Visit: Admitting: Nurse Practitioner

## 2024-11-07 ENCOUNTER — Other Ambulatory Visit

## 2024-11-07 DIAGNOSIS — Z981 Arthrodesis status: Secondary | ICD-10-CM

## 2024-11-07 DIAGNOSIS — E118 Type 2 diabetes mellitus with unspecified complications: Secondary | ICD-10-CM

## 2024-11-07 DIAGNOSIS — E538 Deficiency of other specified B group vitamins: Secondary | ICD-10-CM

## 2024-11-07 DIAGNOSIS — G4733 Obstructive sleep apnea (adult) (pediatric): Secondary | ICD-10-CM

## 2024-11-07 DIAGNOSIS — M5416 Radiculopathy, lumbar region: Secondary | ICD-10-CM

## 2024-11-07 DIAGNOSIS — G8929 Other chronic pain: Secondary | ICD-10-CM

## 2024-11-08 LAB — COMPREHENSIVE METABOLIC PANEL WITH GFR
AG Ratio: 1.7 (calc) (ref 1.0–2.5)
ALT: 12 U/L (ref 6–29)
AST: 16 U/L (ref 10–35)
Albumin: 4.2 g/dL (ref 3.6–5.1)
Alkaline phosphatase (APISO): 58 U/L (ref 37–153)
BUN: 13 mg/dL (ref 7–25)
CO2: 30 mmol/L (ref 20–32)
Calcium: 9.4 mg/dL (ref 8.6–10.4)
Chloride: 103 mmol/L (ref 98–110)
Creat: 0.7 mg/dL (ref 0.50–1.05)
Globulin: 2.5 g/dL (ref 1.9–3.7)
Glucose, Bld: 134 mg/dL — ABNORMAL HIGH (ref 65–99)
Potassium: 4.5 mmol/L (ref 3.5–5.3)
Sodium: 142 mmol/L (ref 135–146)
Total Bilirubin: 0.5 mg/dL (ref 0.2–1.2)
Total Protein: 6.7 g/dL (ref 6.1–8.1)
eGFR: 97 mL/min/{1.73_m2}

## 2024-11-08 LAB — HEMOGLOBIN A1C
Hgb A1c MFr Bld: 6.5 % — ABNORMAL HIGH
Mean Plasma Glucose: 140 mg/dL
eAG (mmol/L): 7.7 mmol/L

## 2024-11-08 LAB — CBC WITH DIFFERENTIAL/PLATELET
Absolute Lymphocytes: 1642 {cells}/uL (ref 850–3900)
Absolute Monocytes: 489 {cells}/uL (ref 200–950)
Basophils Absolute: 27 {cells}/uL (ref 0–200)
Basophils Relative: 0.4 %
Eosinophils Absolute: 529 {cells}/uL — ABNORMAL HIGH (ref 15–500)
Eosinophils Relative: 7.9 %
HCT: 42.9 % (ref 35.0–45.0)
Hemoglobin: 13.9 g/dL (ref 11.7–15.5)
MCH: 30.2 pg (ref 27.0–33.0)
MCHC: 32.4 g/dL (ref 32.0–36.0)
MCV: 93.1 fL (ref 80.0–100.0)
MPV: 10.2 fL (ref 7.5–12.5)
Monocytes Relative: 7.3 %
Neutro Abs: 4013 {cells}/uL (ref 1500–7800)
Neutrophils Relative %: 59.9 %
Platelets: 210 Thousand/uL (ref 140–400)
RBC: 4.61 Million/uL (ref 3.80–5.10)
RDW: 13.6 % (ref 11.0–15.0)
Total Lymphocyte: 24.5 %
WBC: 6.7 Thousand/uL (ref 3.8–10.8)

## 2024-11-08 LAB — URINALYSIS, ROUTINE W REFLEX MICROSCOPIC
Bacteria, UA: NONE SEEN /HPF
Bilirubin Urine: NEGATIVE
Hgb urine dipstick: NEGATIVE
Hyaline Cast: NONE SEEN /LPF
Ketones, ur: NEGATIVE
Nitrite: NEGATIVE
Protein, ur: NEGATIVE
RBC / HPF: NONE SEEN /HPF (ref 0–2)
Specific Gravity, Urine: 1.037 — ABNORMAL HIGH (ref 1.001–1.035)
pH: 6 (ref 5.0–8.0)

## 2024-11-08 LAB — LIPID PANEL
Cholesterol: 133 mg/dL
HDL: 65 mg/dL
LDL Cholesterol (Calc): 52 mg/dL
Non-HDL Cholesterol (Calc): 68 mg/dL
Total CHOL/HDL Ratio: 2 (calc)
Triglycerides: 77 mg/dL

## 2024-11-08 LAB — MAGNESIUM: Magnesium: 1.9 mg/dL (ref 1.5–2.5)

## 2024-11-08 LAB — MICROSCOPIC MESSAGE

## 2024-11-20 ENCOUNTER — Ambulatory Visit

## 2024-11-21 ENCOUNTER — Ambulatory Visit (INDEPENDENT_AMBULATORY_CARE_PROVIDER_SITE_OTHER)

## 2024-11-21 VITALS — BP 140/90 | HR 63 | Ht 64.0 in | Wt 207.8 lb

## 2024-11-21 DIAGNOSIS — M5416 Radiculopathy, lumbar region: Secondary | ICD-10-CM

## 2024-11-21 DIAGNOSIS — M5441 Lumbago with sciatica, right side: Secondary | ICD-10-CM

## 2024-11-21 DIAGNOSIS — G8929 Other chronic pain: Secondary | ICD-10-CM | POA: Diagnosis not present

## 2024-11-21 DIAGNOSIS — Z981 Arthrodesis status: Secondary | ICD-10-CM

## 2024-11-21 MED ORDER — PREDNISONE 10 MG PO TABS: 10.0000 mg | ORAL_TABLET | Freq: Every day | ORAL | 0 refills | Status: DC

## 2024-11-21 NOTE — Progress Notes (Signed)
 Progress Note  Physician: Ade Stmarie A Mesiah Manzo, MD   HPI: Brittany Santos is a 63 y.o. female presenting on 11/21/2024 for Establish Care (Patient states her hips are still hurting.) .  Discussed the use of AI scribe software for clinical note transcription with the patient, who gave verbal consent to proceed.  History of Present Illness   Brittany Santos is a 63 year old female with diabetes and back pain who presents for follow-up regarding her back pain and medication management.  She has chronic lower back pain from prior vertebral fractures, with radiation into her hips and leg. Pain worsens with prolonged standing and with sitting beyond about 15 minutes. She takes gabapentin  100 mg in the morning and 100 mg in the evening and does chiropractor-directed exercises. She is planning a trip to [place]  and a cruise in January and is concerned about controlling pain while traveling. She has previously used short prednisone  courses for acute pain relief during travel.  She has diabetes treated with metformin  and Jardiance , with a recent hemoglobin A1c of 6.4. She is trying to follow a diabetic diet but has gained a few pounds recently around the holidays. - Diabetes managed with metformin  1000 mg twice daily and Jardiance  25 mg daily - Last hemoglobin A1c was 6.4%--> 6.5 - Difficulty adhering to diabetic diet due to caregiving responsibilities - Exercise less frequent due to caregiving duties, but has a home exercise setup - No visual disturbances; recent eye exam without abnormalities  She developed hip muscle pain after about a week on a statin and stopped the medication because of the pain and concern about adverse effects. She understands that diabetes increases her cardiovascular risk but is reluctant to restart a statin.  She had a lumbar compression fracture diagnosed in 2019 with imaging that showed mild spinal stenosis and disc bulging. She has not had repeat  imaging since and is being seen today in the context of ongoing back pain.    Medical history:  Relevant past medical, surgical, family and social history reviewed and updated as indicated. Interim medical history since our last visit reviewed.  Allergies and medications reviewed and updated.   ROS: Negative unless specifically indicated above in HPI.    Current Outpatient Medications:    azelastine  (ASTELIN ) 0.1 % nasal spray, PLACE 1 SPRAY INTO BOTH NOSTRILS TWICE DAILY, Disp: 30 mL, Rfl: 2   BIOTIN PO, Take 1 tablet by mouth daily., Disp: , Rfl:    Calcium -Magnesium-Vitamin D  (CALCIUM  500 PO), Take by mouth., Disp: , Rfl:    cetirizine  (ZYRTEC ) 10 MG tablet, Take 1 tablet (10 mg total) by mouth daily., Disp: 30 tablet, Rfl: 1   empagliflozin  (JARDIANCE ) 25 MG TABS tablet, Take 1 tablet (25 mg total) by mouth daily before breakfast., Disp: 90 tablet, Rfl: 3   gabapentin  (NEURONTIN ) 100 MG capsule, Take 1 capsule (100 mg total) by mouth in the morning and 1 capsule at bedtime, Disp: 180 capsule, Rfl: 3   glucose blood test strip, 1 each by Other route daily., Disp: , Rfl:    Lancet Devices (ONE TOUCH DELICA LANCING DEV) MISC, Use as directed Dx: E11.9, Disp: 1 each, Rfl: 0   Lancets (ONETOUCH DELICA PLUS LANCET30G) MISC, 1 each See admin instructions., Disp: , Rfl:    metFORMIN  (GLUCOPHAGE -XR) 500 MG 24 hr tablet, Take 1 tablet (500 mg total) by mouth 2 (two) times daily with a meal., Disp: 360 tablet, Rfl:  1   Multiple Vitamin (MULTIVITAMIN) capsule, Take 1 capsule by mouth daily., Disp: , Rfl:    pantoprazole  (PROTONIX ) 40 MG tablet, Take 1 tablet (40 mg total) by mouth 2 (two) times daily., Disp: 180 tablet, Rfl: 3   predniSONE  (DELTASONE ) 10 MG tablet, Take 1 tablet (10 mg total) by mouth daily with breakfast., Disp: 5 tablet, Rfl: 0   sucralfate  (CARAFATE ) 1 GM/10ML suspension, Take 10 mLs (1 g total) by mouth 4 (four) times daily -  with meals and at bedtime., Disp: 420 mL, Rfl: 3    rosuvastatin  (CRESTOR ) 10 MG tablet, Take 1 tablet (10 mg total) by mouth daily. (Patient not taking: Reported on 11/21/2024), Disp: 90 tablet, Rfl: 3       Objective:     BP (!) 140/90   Pulse 63   Ht 5' 4 (1.626 m)   Wt 207 lb 12.8 oz (94.3 kg)   LMP  (LMP Unknown) Comment: Patient states she had her uterus scraped sometime in the past 10 years.   SpO2 98%   BMI 35.67 kg/m   Wt Readings from Last 3 Encounters:  11/21/24 207 lb 12.8 oz (94.3 kg)  10/17/24 205 lb 9.6 oz (93.3 kg)  08/26/24 205 lb (93 kg)    Physical Exam  Physical Exam Vitals reviewed.  Constitutional:      Appearance: Normal appearance. Well-developed with normal weight.  Cardiovascular:     Rate and Rhythm: Normal rate and regular rhythm. Normal heart sounds. Normal peripheral pulses Pulmonary:     Normal breath sounds with normal effort Skin:    General: Skin is warm and dry without noticeable rash. Neurological:     General: No focal deficit present.  Psychiatric:        Mood and Affect: Mood, behavior and cognition normal      Assessment & Plan:   Encounter Diagnoses  Name Primary?   History of fusion of cervical spine Yes   Lumbar radiculopathy    Chronic right-sided low back pain with right-sided sciatica     Orders Placed This Encounter  Procedures   MR LUMBAR SPINE WO CONTRAST     Assessment and Plan    Chronic right-sided low back pain with lumbar radiculopathy and sciatica Gabapentin  provides relief. Previous imaging > 5 years ago showed mild stenosis and disc bulging. Discussed surgical intervention if MRI shows significant stenosis or nerve compression. - Ordered lumbar MRI to assess stenosis and nerve compression. - Continue gabapentin  at current dose. - Consider neurosurgery referral based on MRI findings. - Prescribed prednisone  10 mg for 5 days for travel pain management.  Type 2 diabetes mellitus Well-controlled with metformin  and Jardiance . Hemoglobin A1c is 6.4%.  Discussed potential medication reduction with successful dietary and lifestyle changes. - Continue metformin  and Jardiance . - Encouraged adherence to diabetic diet and increased physical activity.  Vitamin B12 deficiency managed with supplements. Current level is 1500. Discussed metformin 's impact on B12 levels. - Restart B12 supplementation at 1000 mcg every second day. - Check B12 levels in May.      Will fu after MRI results complete.  Appt for May for labs/DM

## 2024-12-03 ENCOUNTER — Inpatient Hospital Stay: Admission: RE | Admit: 2024-12-03 | Discharge: 2024-12-03

## 2024-12-03 DIAGNOSIS — M5416 Radiculopathy, lumbar region: Secondary | ICD-10-CM

## 2024-12-15 ENCOUNTER — Telehealth (INDEPENDENT_AMBULATORY_CARE_PROVIDER_SITE_OTHER)

## 2024-12-15 DIAGNOSIS — M5441 Lumbago with sciatica, right side: Secondary | ICD-10-CM | POA: Diagnosis not present

## 2024-12-15 DIAGNOSIS — M5416 Radiculopathy, lumbar region: Secondary | ICD-10-CM

## 2024-12-15 DIAGNOSIS — G8929 Other chronic pain: Secondary | ICD-10-CM | POA: Diagnosis not present

## 2024-12-15 MED ORDER — TRAMADOL HCL 50 MG PO TABS: 50.0000 mg | ORAL_TABLET | Freq: Three times a day (TID) | ORAL | 1 refills | Status: AC | PRN

## 2024-12-15 NOTE — Progress Notes (Signed)
 "           Progress Note  Physician: Arisa Congleton A Chief Walkup, MD   Patient contacted 12/15/2024 at  1:20 PM EST by a video enabled telemedicine application and verified that I am speaking with the correct person.   Patient is aware of limitations of evaluation by telemedicine The patient expressed understanding and agreed to proceed.   HPI:   Discussed the use of AI scribe software for clinical note transcription with the patient, who gave verbal consent to proceed.  History of Present Illness   Brittany Santos is a 63 year old female who presents with worsening chronic back pain.  Chronic back pain - Worsening over time. Pain is significant when standing or walking long distances, though sometimes walking provides relief - Difficulty sleeping due to pain; unable to sleep on her side or sit flat - Frequently wakes up in the middle of the night and uses physical therapy techniques to alleviate pain and return to sleep  Neuropathic and musculoskeletal symptoms - Gabapentin  taken in the morning and at night for nerve pain, provides relief from sciatica pain - Occasional use of Tylenol  and Advil for pain relief, cautious of stomach irritation  Spinal degeneration and surgical history - History of prior spine surgery cervical spine - Bone density reported as normal   - MRI showing multi-level disc herniation as well as below impression:     IMPRESSION:   1. Since 09/07/2018, no significant change in overall mild multilevel lumbar spondylosis without high-grade spinal canal or neural foraminal narrowing. 2. Redemonstrated chronic anterior wedging compression deformity of the L2 vertebral body. Social history:  Relevant past medical, surgical, family and social history reviewed and updated as indicated. Interim medical history since our last visit reviewed.  Allergies and medications reviewed and updated.  DATA REVIEWED: CHART IN EPIC  ROS: Negative unless specifically  indicated above in HPI.   Current Medications[1]      Objective:  Telephone visit     Assessment & Plan:  There are no diagnoses linked to this encounter.   No follow-ups on file.  Assessment and Plan    Chronic right-sided low back pain with sciatica and lumbar degenerative disc disease No acute surgical indication, but worsening symptoms require further evaluation. - Referred to neurosurgery for further evaluation and MRI discussion. - Continue gabapentin  for nerve pain. - Use tramadol  as needed, avoid concurrent use with gabapentin . - Use Tylenol  and ibuprofen as needed, space doses to prevent gastrointestinal and renal side effects.          [1]  Current Outpatient Medications:    azelastine  (ASTELIN ) 0.1 % nasal spray, PLACE 1 SPRAY INTO BOTH NOSTRILS TWICE DAILY, Disp: 30 mL, Rfl: 2   BIOTIN PO, Take 1 tablet by mouth daily., Disp: , Rfl:    Calcium -Magnesium-Vitamin D  (CALCIUM  500 PO), Take by mouth., Disp: , Rfl:    cetirizine  (ZYRTEC ) 10 MG tablet, Take 1 tablet (10 mg total) by mouth daily., Disp: 30 tablet, Rfl: 1   empagliflozin  (JARDIANCE ) 25 MG TABS tablet, Take 1 tablet (25 mg total) by mouth daily before breakfast., Disp: 90 tablet, Rfl: 3   gabapentin  (NEURONTIN ) 100 MG capsule, Take 1 capsule (100 mg total) by mouth in the morning and 1 capsule at bedtime, Disp: 180 capsule, Rfl: 3   glucose blood test strip, 1 each by Other route daily., Disp: , Rfl:    Lancet Devices (ONE TOUCH DELICA LANCING DEV) MISC, Use as directed Dx: E11.9,  Disp: 1 each, Rfl: 0   Lancets (ONETOUCH DELICA PLUS LANCET30G) MISC, 1 each See admin instructions., Disp: , Rfl:    metFORMIN  (GLUCOPHAGE -XR) 500 MG 24 hr tablet, Take 1 tablet (500 mg total) by mouth 2 (two) times daily with a meal., Disp: 360 tablet, Rfl: 1   Multiple Vitamin (MULTIVITAMIN) capsule, Take 1 capsule by mouth daily., Disp: , Rfl:    pantoprazole  (PROTONIX ) 40 MG tablet, Take 1 tablet (40 mg total) by mouth 2 (two)  times daily., Disp: 180 tablet, Rfl: 3   predniSONE  (DELTASONE ) 10 MG tablet, Take 1 tablet (10 mg total) by mouth daily with breakfast., Disp: 5 tablet, Rfl: 0   rosuvastatin  (CRESTOR ) 10 MG tablet, Take 1 tablet (10 mg total) by mouth daily. (Patient not taking: Reported on 11/21/2024), Disp: 90 tablet, Rfl: 3   sucralfate  (CARAFATE ) 1 GM/10ML suspension, Take 10 mLs (1 g total) by mouth 4 (four) times daily -  with meals and at bedtime., Disp: 420 mL, Rfl: 3  "

## 2024-12-16 ENCOUNTER — Telehealth: Payer: Self-pay

## 2024-12-16 ENCOUNTER — Other Ambulatory Visit (HOSPITAL_COMMUNITY): Payer: Self-pay

## 2024-12-16 NOTE — Telephone Encounter (Signed)
 Pharmacy Patient Advocate Encounter   Received notification from Onbase that prior authorization for Tramadol  50 is required/requested.   Insurance verification completed.   The patient is insured through [facility].   Per test claim: PA required; PA submitted to above mentioned insurance via Latent Key/confirmation #/EOC [id number] Status is pending

## 2024-12-16 NOTE — Telephone Encounter (Signed)
 Pharmacy Patient Advocate Encounter  Received notification from [facility] that Prior Authorization for Tramadol  50 has been APPROVED from 12/16/24 to 06/14/25. Ran test claim, Copay is $6.13. This test claim was processed through [facility]- copay amounts may vary at other pharmacies due to pharmacy/plan contracts, or as the patient moves through the different stages of their insurance plan.   PA #/Case ID/Reference #: # [id number]

## 2024-12-26 ENCOUNTER — Other Ambulatory Visit: Payer: Self-pay

## 2024-12-26 DIAGNOSIS — E118 Type 2 diabetes mellitus with unspecified complications: Secondary | ICD-10-CM

## 2024-12-26 MED ORDER — METFORMIN HCL ER 500 MG PO TB24: 500.0000 mg | ORAL_TABLET | Freq: Two times a day (BID) | ORAL | 1 refills | Status: AC

## 2024-12-29 ENCOUNTER — Ambulatory Visit (INDEPENDENT_AMBULATORY_CARE_PROVIDER_SITE_OTHER)

## 2024-12-29 ENCOUNTER — Ambulatory Visit: Payer: Self-pay

## 2024-12-29 VITALS — BP 140/80 | HR 98 | Ht 64.0 in | Wt 199.6 lb

## 2024-12-29 DIAGNOSIS — J019 Acute sinusitis, unspecified: Secondary | ICD-10-CM | POA: Diagnosis not present

## 2024-12-29 DIAGNOSIS — E118 Type 2 diabetes mellitus with unspecified complications: Secondary | ICD-10-CM | POA: Diagnosis not present

## 2024-12-29 MED ORDER — AMOXICILLIN-POT CLAVULANATE 875-125 MG PO TABS: 1.0000 | ORAL_TABLET | Freq: Two times a day (BID) | ORAL | 0 refills | Status: DC

## 2024-12-29 MED ORDER — FLUTICASONE PROPIONATE 50 MCG/ACT NA SUSP: 2.0000 | Freq: Every day | NASAL | 6 refills | Status: AC

## 2024-12-29 NOTE — Progress Notes (Signed)
 "    Acute Patient Visit  Physician: Santanna Whitford A Aiya Keach, MD  Patient: Brittany Santos MRN: [id number] DOB: [birth year] PCP: Nitasha Jewel A, MD     Subjective:   Chief Complaint  Patient presents with   Cough    Patient stated she is coughing with phlegm  nasal drip and nasal pressure, headache.     HPI: The patient is a 64 y.o. female who presents today for:   Discussed the use of AI scribe software for clinical note transcription with the patient, who gave verbal consent to proceed.  History of Present Illness   [name] is a 64 year old female who presents with worsening sinus infection symptoms.  Sinus congestion and nasal drainage - Symptoms began on Wednesday with progressive worsening. - Significant nasal drainage present, now purulent - No fever or chills. - Cough producing green sputum, which was brown this morning. - Shortness of breath occurs only when coughing.  Appetite and hydration - Drinking lots of water. - Tolerating oral intake though decreased  Medication tolerance - History of nausea with clarithromycin. - No other antibiotic allergies.    ROS:   As noted in the HPI    ASSESMENT/PLAN:  Encounter Diagnoses  Name Primary?   Acute non-recurrent sinusitis, unspecified location Yes   Type 2 diabetes with complication (HCC)     No orders of the defined types were placed in this encounter.   Assessment and Plan    Acute sinusitis Symptoms suggest bacterial sinusitis.   - Augmentin  875mg  BID x 7d - Recommended nasal saline flush and Flonase  before bed. - Advised OTC mucinex, dough medication otherwise if desired - Encouraged rest and hydration.   F/u if fever, chills, worsening symptoms otherwise  OBJECTIVE: Vitals:   12/29/24 1046  BP: (!) 140/80  Pulse: 98  SpO2: 97%  Weight: 199 lb 9.6 oz (90.5 kg)  Height: 5' 4 (1.626 m)    Body mass index is 34.26 kg/m.   Physical Exam Vitals reviewed.   Constitutional:      Appearance: Normal appearance. Well-developed with normal weight.  Cardiovascular:     Rate and Rhythm: Normal rate and regular rhythm. Normal heart sounds. Normal peripheral pulses Pulmonary:     Normal breath sounds with normal effort Skin:    General: Skin is warm and dry without noticeable rash. Neurological:     General: No focal deficit present.  Psychiatric:        Mood and Affect: Mood, behavior and cognition normal       Allergies Patient is allergic to clarithromycin, hydrocodone, vicodin [hydrocodone-acetaminophen ], and acetaminophen .  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  Surgical History Patient  has a past surgical history that includes Breast surgery; Bilateral carpal tunnel release; Cholecystectomy; cervical neck fusion; Esophagogastroduodenoscopy (egd) with propofol  (N/A, 05/12/2016); Colonoscopy (2017); Endovenous ablation saphenous vein w/ laser (Left, 04/15/2020); Colonoscopy with propofol  (N/A, 08/12/2020); Esophagogastroduodenoscopy (egd) with propofol  (N/A, 08/12/2020); Breast excisional biopsy (Bilateral); Endovenous ablation saphenous vein w/ laser (Left, 10/21/2020); and Tonsillectomy (Left, 09/04/2022).  Family History Pateint's family history includes Aneurysm in her paternal grandmother; Breast cancer in her mother; Breast cancer (age of onset: 17) in her sister;  Cancer in her maternal aunt; Cancer (age of onset: 82) in her sister; Heart attack in her paternal uncle; Hypertension in her brother; Kidney cancer (age of onset: 15) in her maternal  uncle; Prostate cancer (age of onset: 53) in her cousin; Prostate cancer (age of onset: 43) in her maternal uncle.  Social History Patient  reports that she has never smoked. She has never used smokeless tobacco. She reports that she does not drink alcohol and does not use drugs.    12/29/2024 "

## 2024-12-29 NOTE — Telephone Encounter (Signed)
 FYI Only or Action Required?: FYI only for provider: appointment scheduled on 12/29/24 with PCP.  Patient was last seen in primary care on 12/15/2024 by Zafirov, Clarissa A, MD.  Called Nurse Triage reporting Cough and Sinusitis.  Symptoms began 4 days ago.  Interventions attempted: OTC medications: tylenol .  Symptoms are: stable.  Triage Disposition: See HCP Within 4 Hours (Or PCP Triage)  Patient/caregiver understands and will follow disposition?: Yes             Reason for Disposition  Wheezing is present  Answer Assessment - Initial Assessment Questions Patient reports started 4 days ago, congestion sinus, back teeth throb a bit. Sinus infection all the time. Was [facility] [place]  [facility] [place]. More blew nose so much mucus white to yellow, day 2 that night coughing up out of lungs white ish , to green. This morning brown. Sinus pain and  congestion moderate. When blows nose green. Wheezing  and shortness of breath with coughing spells . Deep breath induce coughing spells. Booked same day acute visit with PCP today at 10:40 AM.  Patient has not taken covid test but will wear mask.       1. ONSET: When did the cough begin?      4 days ago today  2. SEVERITY: How bad is the cough today?      Severe coughing fits  3. SPUTUM: Describe the color of your sputum (e.g., none, dry cough; clear, white, yellow, green)     Delores now was white then yellow  4. HEMOPTYSIS: Are you coughing up any blood? If Yes, ask: How much? (e.g., flecks, streaks, tablespoons, etc.)     Denies  5. DIFFICULTY BREATHING: Are you having difficulty breathing? If Yes, ask: How bad is it? (e.g., mild, moderate, severe)      Shortness of breath only with severe  coughing fits  6. FEVER: Do you have a fever? If Yes, ask: What is your temperature, how was it measured, and when did it start?     Denies  7. CARDIAC HISTORY: Do you have any history of heart disease? (e.g., heart  attack, congestive heart failure)      Denies  8. LUNG HISTORY: Do you have any history of lung disease?  (e.g., pulmonary embolus, asthma, emphysema)     OSA per chart review  sleep with cpap every night  9. PE RISK FACTORS: Do you have a history of blood clots? (or: recent major surgery, recent prolonged travel, bedridden)     Denies  10. OTHER SYMPTOMS: Do you have any other symptoms? (e.g., runny nose, wheezing, chest pain)       Sinus pain and congestion this morning 6-7/10 , taking tylenol  helps the sinus headache, congestion, green when blowing nose   Patient denies the following chest pain, shortness of breath when not coughing, fever   12. TRAVEL: Have you traveled out of the country in the last month? (e.g., travel history, exposures)       Returned from cruise last night  Protocols used: Cough - Acute Productive-A-AH Copied from [employer] [id number]. Topic: Clinical - Red Word Triage >> Dec 29, 2024  8:06 AM Antony RAMAN wrote: Red Word that prompted transfer to Nurse Triage: coughing up brown, and blowing nose its green

## 2025-01-01 ENCOUNTER — Telehealth: Payer: Self-pay

## 2025-01-01 NOTE — Telephone Encounter (Signed)
 Copied from CRM [id number]. Topic: Clinical - Medication Question >> Jan 01, 2025  3:07 PM Santiya F wrote: Reason for CRM: Patient is calling in checking on the status of a medication request. She says her coughing is getting worse and she does not want to go through the weekend like that. Please advise.  Scheduled patient an appointment for tomorrow.

## 2025-01-02 ENCOUNTER — Ambulatory Visit: Admitting: Internal Medicine

## 2025-01-02 ENCOUNTER — Encounter: Payer: Self-pay | Admitting: Internal Medicine

## 2025-01-02 VITALS — BP 138/84 | HR 81 | Ht 64.0 in | Wt 200.8 lb

## 2025-01-02 DIAGNOSIS — J019 Acute sinusitis, unspecified: Secondary | ICD-10-CM

## 2025-01-02 DIAGNOSIS — B9689 Other specified bacterial agents as the cause of diseases classified elsewhere: Secondary | ICD-10-CM | POA: Diagnosis not present

## 2025-01-02 DIAGNOSIS — R051 Acute cough: Secondary | ICD-10-CM | POA: Diagnosis not present

## 2025-01-02 MED ORDER — PROMETHAZINE-DM 6.25-15 MG/5ML PO SYRP: 5.0000 mL | ORAL_SOLUTION | Freq: Four times a day (QID) | ORAL | 0 refills | Status: DC | PRN

## 2025-01-02 MED ORDER — ALBUTEROL SULFATE HFA 108 (90 BASE) MCG/ACT IN AERS: 2.0000 | INHALATION_SPRAY | Freq: Four times a day (QID) | RESPIRATORY_TRACT | 0 refills | Status: AC | PRN

## 2025-01-02 NOTE — Progress Notes (Signed)
 "  Subjective:    Patient ID: Brittany Santos, female    DOB: June 11, 1961, 64 y.o.   MRN: [id number]  HPI   Discussed the use of AI scribe software for clinical note transcription with the patient, who gave verbal consent to proceed.  Brittany Santos is a 64 year old female with diabetes who presents with persistent upper respiratory sinus symptoms.  She has been experiencing ongoing upper respiratory sinus symptoms despite completing a course of Augmentin  antibiotics prescribed on December 29, 2024. She was also using Flonase , Mucinex, and performing sinus rinses, but these have not alleviated her symptoms. She completed a seven-day course of antibiotics, taking two tablets per day, but there is confusion about the number of tablets taken as she finished them earlier than expected (in 3 days instead of 7).  She experiences sinus pressure around the eyes and upper teeth, a runny nose, and some nasal congestion. She also coughs up a little phlegm, which worsens at night or when outside. No headache, ear pain, sore throat, shortness of breath except during coughing spells, nausea, vomiting, diarrhea, fever, chills, or body aches. She experiences shortness of breath only when she is coughing and unable to catch her breath.  She takes Zyrtec  daily, year-round, and has been using Sudafed but was unsure about its use with antibiotics. She has not taken any antihistamines other than Zyrtec . She has not used an inhaler before and does not have a history of asthma or COPD.  She is responsible for taking care of her mother, which impacts her ability to manage her symptoms effectively.      Past Medical History:  Diagnosis Date   Allergy    Arthritis    BRCA negative 01/2020   MyRisk neg   Diabetes mellitus without complication (HCC)    Family history of breast cancer    GERD (gastroesophageal reflux disease)    History of colon polyps 2016, 2017   at [facility]   History of diverticulitis     History of kidney stones 04/15/2015   Increased risk of breast cancer 01/2020   IBIS=26.6%   Sebaceous gland hyperplasia of vulva 03/06/2022   Sleep apnea     Current Outpatient Medications  Medication Sig Dispense Refill   amoxicillin -clavulanate (AUGMENTIN ) 875-125 MG tablet Take 1 tablet by mouth 2 (two) times daily for 7 days. 14 tablet 0   azelastine  (ASTELIN ) 0.1 % nasal spray PLACE 1 SPRAY INTO BOTH NOSTRILS TWICE DAILY 30 mL 2   BIOTIN PO Take 1 tablet by mouth daily.     Calcium -Magnesium-Vitamin D  (CALCIUM  500 PO) Take by mouth.     cetirizine  (ZYRTEC ) 10 MG tablet Take 1 tablet (10 mg total) by mouth daily. 30 tablet 1   empagliflozin  (JARDIANCE ) 25 MG TABS tablet Take 1 tablet (25 mg total) by mouth daily before breakfast. 90 tablet 3   fluticasone  (FLONASE ) 50 MCG/ACT nasal spray Place 2 sprays into both nostrils daily. 16 g 6   gabapentin  (NEURONTIN ) 100 MG capsule Take 1 capsule (100 mg total) by mouth in the morning and 1 capsule at bedtime 180 capsule 3   glucose blood test strip 1 each by Other route daily.     Lancet Devices (ONE TOUCH DELICA LANCING DEV) MISC Use as directed Dx: E11.9 1 each 0   Lancets (ONETOUCH DELICA PLUS LANCET30G) MISC 1 each See admin instructions.     metFORMIN  (GLUCOPHAGE -XR) 500 MG 24 hr tablet Take 1 tablet (500 mg total) by mouth  2 (two) times daily with a meal. 360 tablet 1   Multiple Vitamin (MULTIVITAMIN) capsule Take 1 capsule by mouth daily.     pantoprazole  (PROTONIX ) 40 MG tablet Take 1 tablet (40 mg total) by mouth 2 (two) times daily. 180 tablet 3   predniSONE  (DELTASONE ) 10 MG tablet Take 1 tablet (10 mg total) by mouth daily with breakfast. 5 tablet 0   sucralfate  (CARAFATE ) 1 GM/10ML suspension Take 10 mLs (1 g total) by mouth 4 (four) times daily -  with meals and at bedtime. 420 mL 3   traMADol  (ULTRAM ) 50 MG tablet Take 1 tablet (50 mg total) by mouth every 8 (eight) hours as needed for up to 20 days. 30 tablet 1   No current  facility-administered medications for this visit.    Allergies[1]  Family History  Problem Relation Age of Onset   Breast cancer Mother        twice--30s and 30 (bilat breasts)   Breast cancer Sister 13   Cancer Sister 20       Blood cancer (unknown)   Hypertension Brother    Cancer Maternal Aunt        Lung   Kidney cancer Maternal Uncle 30   Prostate cancer Maternal Uncle 42       needed seed tx   Heart attack Paternal Uncle    Aneurysm Paternal Grandmother    Prostate cancer Cousin 20   Diabetes Neg Hx     Social History   Socioeconomic History   Marital status: Single    Spouse name: Not on file   Number of children: Not on file   Years of education: Not on file   Highest education level: Associate degree: academic program  Occupational History   Not on file  Tobacco Use   Smoking status: Never   Smokeless tobacco: Never  Vaping Use   Vaping status: Never Used  Substance and Sexual Activity   Alcohol use: No    Alcohol/week: 0.0 standard drinks of alcohol   Drug use: No   Sexual activity: Not Currently    Birth control/protection: Post-menopausal  Other Topics Concern   Not on file  Social History Narrative   Works- [employer]    Mother lives with pt   No children    1 dog lives inside    Right handed    Caffeine- 1 piece chocolate occasionally    Associate degree   Enjoys- gardening and going to concerts    Social Drivers of Health   Tobacco Use: Low Risk (10/17/2024)   Patient History    Smoking Tobacco Use: Never    Smokeless Tobacco Use: Never    Passive Exposure: Not on file  Financial Resource Strain: Medium Risk (09/30/2024)   Overall Financial Resource Strain (CARDIA)    Difficulty of Paying Living Expenses: Somewhat hard  Food Insecurity: Food Insecurity Present (09/30/2024)   Epic    Worried About Programme Researcher, Broadcasting/film/video in the Last Year: Never true    Ran Out of Food in the Last Year: Sometimes true  Transportation Needs: No  Transportation Needs (09/30/2024)   Epic    Lack of Transportation (Medical): No    Lack of Transportation (Non-Medical): No  Physical Activity: Insufficiently Active (09/30/2024)   Exercise Vital Sign    Days of Exercise per Week: 1 day    Minutes of Exercise per Session: 30 min  Stress: Stress Concern Present (09/30/2024)   [employer] of Occupational Health -  Occupational Stress Questionnaire    Feeling of Stress: To some extent  Social Connections: Moderately Integrated (09/30/2024)   Social Connection and Isolation Panel    Frequency of Communication with Friends and Family: More than three times a week    Frequency of Social Gatherings with Friends and Family: Twice a week    Attends Religious Services: 1 to 4 times per year    Active Member of [employer] or Organizations: Yes    Attends Engineer, Structural: More than 4 times per year    Marital Status: Never married  Intimate Partner Violence: Not on file  Depression (PHQ2-9): Low Risk (12/29/2024)   Depression (PHQ2-9)    PHQ-2 Score: 0  Alcohol Screen: Low Risk (09/30/2024)   Alcohol Screen    Last Alcohol Screening Score (AUDIT): 1  Housing: Low Risk (09/30/2024)   Epic    Unable to Pay for Housing in the Last Year: No    Number of Times Moved in the Last Year: 0    Homeless in the Last Year: No  Utilities: Not on file  Health Literacy: Not on file     Constitutional: Denies fever, malaise, fatigue, headache or abrupt weight changes.  HEENT: Pt reports sinus pressure, runny nose. Denies eye pain, eye redness, ear pain, ringing in the ears, wax buildup, runny nose, nasal congestion, bloody nose, or sore throat. Respiratory: Pt reports cough, shortness of breath. Denies difficulty breathing.   Cardiovascular: Denies chest pain, chest tightness, palpitations or swelling in the hands or feet.  Gastrointestinal: Denies abdominal pain, bloating, constipation, diarrhea or blood in the stool.  GU: Denies urgency,  frequency, pain with urination, burning sensation, blood in urine, odor or discharge. Musculoskeletal: Denies decrease in range of motion, difficulty with gait, muscle pain or joint pain and swelling.  Neurological: Denies dizziness, difficulty with memory, difficulty with speech or problems with balance and coordination.    No other specific complaints in a complete review of systems (except as listed in HPI above).  Objective    LMP  (LMP Unknown) Comment: Patient states she had her uterus scraped sometime in the past 10 years.  Wt Readings from Last 3 Encounters:  12/29/24 199 lb 9.6 oz (90.5 kg)  11/21/24 207 lb 12.8 oz (94.3 kg)  10/17/24 205 lb 9.6 oz (93.3 kg)    General: Appears her stated age, obese, appears unwell but in NAD. Skin: Warm, dry and intact.  HEENT: Head: normal shape and size, no sinus tenderness noted; Eyes: sclera white, no icterus, conjunctiva pink, PERRLA and EOMs intact;Nose: mucosa pink and moist, septum midline; Throat/Mouth: Teeth present, mucosa pink and moist, + PND, no exudate, lesions or ulcerations noted.  Neck: No adenopathy noted. Cardiovascular: Normal rate and rhythm. S1,S2 noted.  No murmur, rubs or gallops noted.  Pulmonary/Chest: Normal effort and positive vesicular breath sounds. No respiratory distress. No wheezes, rales or ronchi noted.  Musculoskeletal:  No difficulty with gait.  Neurological: Alert and oriented.   BMET    Component Value Date/Time   NA 142 11/07/2024 0831   NA 142 10/09/2014 0000   K 4.5 11/07/2024 0831   CL 103 11/07/2024 0831   CO2 30 11/07/2024 0831   GLUCOSE 134 (H) 11/07/2024 0831   BUN 13 11/07/2024 0831   BUN 11 10/09/2014 0000   CREATININE 0.70 11/07/2024 0831   CALCIUM  9.4 11/07/2024 0831   GFRNONAA >60 10/21/2015 1724   GFRAA >60 10/21/2015 1724    Lipid Panel  Component Value Date/Time   CHOL 133 11/07/2024 0831   TRIG 77 11/07/2024 0831   HDL 65 11/07/2024 0831   CHOLHDL 2.0 11/07/2024  0831   VLDL 17.0 05/06/2024 0742   LDLCALC 52 11/07/2024 0831    CBC    Component Value Date/Time   WBC 6.7 11/07/2024 0831   RBC 4.61 11/07/2024 0831   HGB 13.9 11/07/2024 0831   HCT 42.9 11/07/2024 0831   PLT 210 11/07/2024 0831   MCV 93.1 11/07/2024 0831   MCH 30.2 11/07/2024 0831   MCHC 32.4 11/07/2024 0831   RDW 13.6 11/07/2024 0831   LYMPHSABS 1.5 05/06/2024 0742   MONOABS 0.5 05/06/2024 0742   EOSABS 529 (H) 11/07/2024 0831   BASOSABS 27 11/07/2024 0831    Hgb A1C Lab Results  Component Value Date   HGBA1C 6.5 (H) 11/07/2024       Assessment and Plan    Assessment and Plan    Acute sinusitis with persistent cough Persistent sinus pressure, rhinorrhea, nasal congestion, and productive cough despite Augmentin . No systemic symptoms. Persistent drainage indicates ongoing sinusitis. - Increase Zyrtec  to twice daily for 1 week. - Prescribed promethazine  DM cough syrup for nocturnal use-sedation caution given. - Prescribed albuterol  inhaler 1 to 2 puffs every 4-6 hours as needed for coughing spells. - Instructed to review antibiotic bottle for dosage and duration as it appears she took this inappropriately.      Follow-up with your PCP as previously scheduled Angeline Laura, NP     [1]  Allergies Allergen Reactions   Clarithromycin Nausea Only    Bad taste in mouth and made her feel nauseated    Hydrocodone Itching, Hives and Rash   Vicodin [Hydrocodone-Acetaminophen ] Itching and Rash   Acetaminophen  Hives and Rash   "

## 2025-01-02 NOTE — Patient Instructions (Signed)

## 2025-01-04 ENCOUNTER — Encounter: Payer: Self-pay | Admitting: Internal Medicine

## 2025-01-08 ENCOUNTER — Other Ambulatory Visit: Payer: Self-pay

## 2025-01-09 MED ORDER — PROMETHAZINE-DM 6.25-15 MG/5ML PO SYRP: 5.0000 mL | ORAL_SOLUTION | Freq: Four times a day (QID) | ORAL | 1 refills | Status: AC | PRN

## 2025-02-03 ENCOUNTER — Encounter

## 2025-04-24 ENCOUNTER — Ambulatory Visit: Admitting: Family Medicine

## 2025-04-24 ENCOUNTER — Ambulatory Visit
